# Patient Record
Sex: Female | Born: 1985 | Race: Black or African American | Hispanic: No | Marital: Single
Health system: Southern US, Community
[De-identification: ages and names within clinical notes are randomized; demographics above are authoritative.]

## PROBLEM LIST (undated history)

## (undated) ENCOUNTER — Inpatient Hospital Stay (HOSPITAL_COMMUNITY): Payer: Self-pay

## (undated) ENCOUNTER — Emergency Department (HOSPITAL_COMMUNITY): Admission: EM | Payer: Self-pay | Source: Home / Self Care

## (undated) DIAGNOSIS — M4050 Lordosis, unspecified, site unspecified: Secondary | ICD-10-CM

## (undated) DIAGNOSIS — R519 Headache, unspecified: Secondary | ICD-10-CM

## (undated) DIAGNOSIS — I10 Essential (primary) hypertension: Secondary | ICD-10-CM

## (undated) DIAGNOSIS — F1220 Cannabis dependence, uncomplicated: Secondary | ICD-10-CM

## (undated) DIAGNOSIS — R569 Unspecified convulsions: Secondary | ICD-10-CM

## (undated) DIAGNOSIS — F4310 Post-traumatic stress disorder, unspecified: Secondary | ICD-10-CM

## (undated) DIAGNOSIS — R51 Headache: Secondary | ICD-10-CM

## (undated) DIAGNOSIS — F1410 Cocaine abuse, uncomplicated: Secondary | ICD-10-CM

## (undated) DIAGNOSIS — F1110 Opioid abuse, uncomplicated: Secondary | ICD-10-CM

## (undated) DIAGNOSIS — F319 Bipolar disorder, unspecified: Secondary | ICD-10-CM

## (undated) DIAGNOSIS — Z915 Personal history of self-harm: Secondary | ICD-10-CM

## (undated) HISTORY — PX: EYE SURGERY: SHX253

## (undated) HISTORY — DX: Headache, unspecified: R51.9

## (undated) HISTORY — PX: WISDOM TOOTH EXTRACTION: SHX21

## (undated) HISTORY — DX: Headache: R51

---
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
## 2006-02-18 ENCOUNTER — Emergency Department (HOSPITAL_COMMUNITY): Admission: EM | Admit: 2006-02-18 | Discharge: 2006-02-18 | Payer: Self-pay | Admitting: Emergency Medicine

## 2006-04-02 ENCOUNTER — Emergency Department (HOSPITAL_COMMUNITY): Admission: EM | Admit: 2006-04-02 | Discharge: 2006-04-03 | Payer: Self-pay | Admitting: Emergency Medicine

## 2006-07-07 ENCOUNTER — Emergency Department (HOSPITAL_COMMUNITY): Admission: EM | Admit: 2006-07-07 | Discharge: 2006-07-07 | Payer: Self-pay | Admitting: Emergency Medicine

## 2006-07-13 ENCOUNTER — Emergency Department (HOSPITAL_COMMUNITY): Admission: EM | Admit: 2006-07-13 | Discharge: 2006-07-13 | Payer: Self-pay | Admitting: Emergency Medicine

## 2006-09-18 ENCOUNTER — Emergency Department (HOSPITAL_COMMUNITY): Admission: EM | Admit: 2006-09-18 | Discharge: 2006-09-18 | Payer: Self-pay | Admitting: Family Medicine

## 2006-09-22 ENCOUNTER — Inpatient Hospital Stay (HOSPITAL_COMMUNITY): Admission: AD | Admit: 2006-09-22 | Discharge: 2006-09-22 | Payer: Self-pay | Admitting: Obstetrics & Gynecology

## 2006-12-25 ENCOUNTER — Ambulatory Visit (HOSPITAL_COMMUNITY): Admission: RE | Admit: 2006-12-25 | Discharge: 2006-12-25 | Payer: Self-pay | Admitting: Obstetrics and Gynecology

## 2007-01-17 ENCOUNTER — Ambulatory Visit (HOSPITAL_COMMUNITY): Admission: RE | Admit: 2007-01-17 | Discharge: 2007-01-17 | Payer: Self-pay | Admitting: Family Medicine

## 2007-02-07 ENCOUNTER — Ambulatory Visit: Payer: Self-pay | Admitting: Obstetrics and Gynecology

## 2007-02-07 ENCOUNTER — Inpatient Hospital Stay (HOSPITAL_COMMUNITY): Admission: AD | Admit: 2007-02-07 | Discharge: 2007-02-10 | Payer: Self-pay | Admitting: Obstetrics & Gynecology

## 2007-02-15 ENCOUNTER — Ambulatory Visit: Payer: Self-pay | Admitting: Family Medicine

## 2007-02-22 ENCOUNTER — Ambulatory Visit: Payer: Self-pay | Admitting: Family Medicine

## 2007-03-01 ENCOUNTER — Ambulatory Visit (HOSPITAL_COMMUNITY): Admission: RE | Admit: 2007-03-01 | Discharge: 2007-03-01 | Payer: Self-pay | Admitting: Family Medicine

## 2007-03-08 ENCOUNTER — Ambulatory Visit (HOSPITAL_COMMUNITY): Admission: RE | Admit: 2007-03-08 | Discharge: 2007-03-08 | Payer: Self-pay | Admitting: Family Medicine

## 2007-03-15 ENCOUNTER — Ambulatory Visit (HOSPITAL_COMMUNITY): Admission: RE | Admit: 2007-03-15 | Discharge: 2007-03-15 | Payer: Self-pay | Admitting: Family Medicine

## 2007-03-15 ENCOUNTER — Ambulatory Visit: Payer: Self-pay | Admitting: Family Medicine

## 2007-03-23 ENCOUNTER — Ambulatory Visit (HOSPITAL_COMMUNITY): Admission: RE | Admit: 2007-03-23 | Discharge: 2007-03-23 | Payer: Self-pay | Admitting: Family Medicine

## 2007-04-05 ENCOUNTER — Ambulatory Visit: Payer: Self-pay | Admitting: Family Medicine

## 2007-04-26 ENCOUNTER — Ambulatory Visit: Payer: Self-pay | Admitting: *Deleted

## 2007-05-03 ENCOUNTER — Ambulatory Visit: Payer: Self-pay | Admitting: Obstetrics & Gynecology

## 2007-05-03 ENCOUNTER — Inpatient Hospital Stay (HOSPITAL_COMMUNITY): Admission: AD | Admit: 2007-05-03 | Discharge: 2007-05-05 | Payer: Self-pay | Admitting: Obstetrics & Gynecology

## 2008-01-02 ENCOUNTER — Emergency Department (HOSPITAL_COMMUNITY): Admission: EM | Admit: 2008-01-02 | Discharge: 2008-01-02 | Payer: Self-pay | Admitting: Emergency Medicine

## 2008-12-02 ENCOUNTER — Emergency Department (HOSPITAL_COMMUNITY): Admission: EM | Admit: 2008-12-02 | Discharge: 2008-12-02 | Payer: Self-pay | Admitting: Emergency Medicine

## 2010-03-28 ENCOUNTER — Emergency Department (HOSPITAL_COMMUNITY): Admission: EM | Admit: 2010-03-28 | Discharge: 2010-03-29 | Payer: Self-pay | Admitting: Emergency Medicine

## 2010-05-04 ENCOUNTER — Emergency Department (HOSPITAL_COMMUNITY): Admission: EM | Admit: 2010-05-04 | Discharge: 2010-05-04 | Payer: Self-pay | Admitting: Emergency Medicine

## 2010-06-21 ENCOUNTER — Emergency Department (HOSPITAL_COMMUNITY): Admission: EM | Admit: 2010-06-21 | Discharge: 2010-06-21 | Payer: Self-pay | Admitting: Family Medicine

## 2010-08-16 ENCOUNTER — Encounter: Payer: Self-pay | Admitting: *Deleted

## 2010-09-14 ENCOUNTER — Emergency Department (HOSPITAL_COMMUNITY)
Admission: EM | Admit: 2010-09-14 | Discharge: 2010-09-14 | Disposition: A | Discharge status: Home / Self Care | Payer: Self-pay | Attending: Emergency Medicine | Admitting: Emergency Medicine

## 2010-09-14 DIAGNOSIS — J45909 Unspecified asthma, uncomplicated: Secondary | ICD-10-CM | POA: Insufficient documentation

## 2010-09-14 DIAGNOSIS — A499 Bacterial infection, unspecified: Secondary | ICD-10-CM | POA: Insufficient documentation

## 2010-09-14 DIAGNOSIS — N760 Acute vaginitis: Secondary | ICD-10-CM | POA: Insufficient documentation

## 2010-09-14 DIAGNOSIS — B9689 Other specified bacterial agents as the cause of diseases classified elsewhere: Secondary | ICD-10-CM | POA: Insufficient documentation

## 2010-09-14 LAB — URINE MICROSCOPIC-ADD ON

## 2010-09-14 LAB — URINALYSIS, ROUTINE W REFLEX MICROSCOPIC
Bilirubin Urine: NEGATIVE
Hgb urine dipstick: NEGATIVE
Ketones, ur: NEGATIVE mg/dL
Nitrite: NEGATIVE
Protein, ur: NEGATIVE mg/dL
Specific Gravity, Urine: 1.025 (ref 1.005–1.030)
Urobilinogen, UA: 0.2 mg/dL (ref 0.0–1.0)

## 2010-09-14 LAB — PREGNANCY, URINE: Preg Test, Ur: NEGATIVE

## 2010-09-14 LAB — WET PREP, GENITAL: Trich, Wet Prep: NONE SEEN

## 2010-09-15 LAB — GC/CHLAMYDIA PROBE AMP, GENITAL
Chlamydia, DNA Probe: NEGATIVE
GC Probe Amp, Genital: NEGATIVE

## 2010-10-05 LAB — POCT URINALYSIS DIPSTICK
Bilirubin Urine: NEGATIVE
Glucose, UA: NEGATIVE mg/dL
Ketones, ur: NEGATIVE mg/dL
Protein, ur: NEGATIVE mg/dL
Specific Gravity, Urine: 1.025 (ref 1.005–1.030)

## 2010-10-05 LAB — WET PREP, GENITAL
Trich, Wet Prep: NONE SEEN
Yeast Wet Prep HPF POC: NONE SEEN

## 2010-10-05 LAB — GC/CHLAMYDIA PROBE AMP, GENITAL
Chlamydia, DNA Probe: NEGATIVE
GC Probe Amp, Genital: NEGATIVE

## 2010-10-06 LAB — URINALYSIS, ROUTINE W REFLEX MICROSCOPIC
Glucose, UA: NEGATIVE mg/dL
Leukocytes, UA: NEGATIVE
Protein, ur: NEGATIVE mg/dL
pH: 6 (ref 5.0–8.0)

## 2010-10-06 LAB — URINE MICROSCOPIC-ADD ON

## 2010-10-06 LAB — WET PREP, GENITAL

## 2010-10-06 LAB — GC/CHLAMYDIA PROBE AMP, GENITAL: GC Probe Amp, Genital: NEGATIVE

## 2010-11-02 LAB — URINALYSIS, ROUTINE W REFLEX MICROSCOPIC
Protein, ur: NEGATIVE mg/dL
Specific Gravity, Urine: 1.016 (ref 1.005–1.030)
Urobilinogen, UA: 0.2 mg/dL (ref 0.0–1.0)

## 2010-11-02 LAB — GC/CHLAMYDIA PROBE AMP, GENITAL
Chlamydia, DNA Probe: NEGATIVE
GC Probe Amp, Genital: NEGATIVE

## 2010-11-02 LAB — DIFFERENTIAL
Basophils Absolute: 0 10*3/uL (ref 0.0–0.1)
Basophils Relative: 0 % (ref 0–1)
Eosinophils Absolute: 0.5 10*3/uL (ref 0.0–0.7)
Neutrophils Relative %: 69 % (ref 43–77)

## 2010-11-02 LAB — CBC
MCHC: 33.6 g/dL (ref 30.0–36.0)
MCV: 89.9 fL (ref 78.0–100.0)
Platelets: 375 10*3/uL (ref 150–400)
RDW: 15.5 % (ref 11.5–15.5)

## 2010-11-02 LAB — WET PREP, GENITAL
Trich, Wet Prep: NONE SEEN
Yeast Wet Prep HPF POC: NONE SEEN

## 2010-11-02 LAB — POCT I-STAT, CHEM 8
Glucose, Bld: 83 mg/dL (ref 70–99)
HCT: 43 % (ref 36.0–46.0)
Hemoglobin: 14.6 g/dL (ref 12.0–15.0)
Potassium: 3.9 mEq/L (ref 3.5–5.1)
Sodium: 136 mEq/L (ref 135–145)

## 2010-11-11 ENCOUNTER — Emergency Department (HOSPITAL_COMMUNITY): Payer: Self-pay

## 2010-11-11 ENCOUNTER — Emergency Department (HOSPITAL_COMMUNITY)
Admission: EM | Admit: 2010-11-11 | Discharge: 2010-11-12 | Disposition: A | Discharge status: Home / Self Care | Payer: Self-pay | Attending: Emergency Medicine | Admitting: Emergency Medicine

## 2010-11-11 DIAGNOSIS — J029 Acute pharyngitis, unspecified: Secondary | ICD-10-CM | POA: Insufficient documentation

## 2010-11-11 DIAGNOSIS — R059 Cough, unspecified: Secondary | ICD-10-CM | POA: Insufficient documentation

## 2010-11-11 DIAGNOSIS — R05 Cough: Secondary | ICD-10-CM | POA: Insufficient documentation

## 2010-11-11 DIAGNOSIS — J40 Bronchitis, not specified as acute or chronic: Secondary | ICD-10-CM | POA: Insufficient documentation

## 2010-11-24 ENCOUNTER — Emergency Department (HOSPITAL_COMMUNITY)
Admission: EM | Admit: 2010-11-24 | Discharge: 2010-11-24 | Disposition: A | Discharge status: Home / Self Care | Payer: Self-pay | Attending: Emergency Medicine | Admitting: Emergency Medicine

## 2010-11-24 DIAGNOSIS — N898 Other specified noninflammatory disorders of vagina: Secondary | ICD-10-CM | POA: Insufficient documentation

## 2010-11-24 DIAGNOSIS — J45909 Unspecified asthma, uncomplicated: Secondary | ICD-10-CM | POA: Insufficient documentation

## 2010-11-24 DIAGNOSIS — A499 Bacterial infection, unspecified: Secondary | ICD-10-CM | POA: Insufficient documentation

## 2010-11-24 DIAGNOSIS — F17200 Nicotine dependence, unspecified, uncomplicated: Secondary | ICD-10-CM | POA: Insufficient documentation

## 2010-11-24 DIAGNOSIS — N760 Acute vaginitis: Secondary | ICD-10-CM | POA: Insufficient documentation

## 2010-11-24 DIAGNOSIS — B9689 Other specified bacterial agents as the cause of diseases classified elsewhere: Secondary | ICD-10-CM | POA: Insufficient documentation

## 2010-11-24 LAB — URINALYSIS, ROUTINE W REFLEX MICROSCOPIC
Bilirubin Urine: NEGATIVE
Glucose, UA: NEGATIVE mg/dL
Ketones, ur: NEGATIVE mg/dL
Leukocytes, UA: NEGATIVE
Specific Gravity, Urine: 1.027 (ref 1.005–1.030)
pH: 6 (ref 5.0–8.0)

## 2010-11-24 LAB — URINE MICROSCOPIC-ADD ON

## 2010-11-24 LAB — WET PREP, GENITAL: Trich, Wet Prep: NONE SEEN

## 2010-11-25 LAB — GC/CHLAMYDIA PROBE AMP, GENITAL: Chlamydia, DNA Probe: NEGATIVE

## 2010-11-26 LAB — POCT I-STAT, CHEM 8
BUN: 17 mg/dL (ref 6–23)
Calcium, Ion: 1.14 mmol/L (ref 1.12–1.32)
Chloride: 103 mEq/L (ref 96–112)
Glucose, Bld: 67 mg/dL — ABNORMAL LOW (ref 70–99)
TCO2: 27 mmol/L (ref 0–100)

## 2010-12-07 NOTE — Discharge Summary (Signed)
NAMEALVENIA, Jessica Walton               ACCOUNT NO.:  [id number]   MEDICAL RECORD NO.:  [id number]          PATIENT TYPE:  INP   LOCATION:  9127                          FACILITY:  [facility]   PHYSICIAN:  Ginger Carne, MD  DATE OF BIRTH:  Jan 01, 1986   DATE OF ADMISSION:  02/07/2007  DATE OF DISCHARGE:  02/10/2007                               DISCHARGE SUMMARY   This is a 25 year old African-American female who presented with  cervical shortening on MFM evaluation of 1.4 cm.  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  She had been started on progesterone  suppositories with an initial diagnosis of a cervix of 1.9 cm but failed  to take said prescribed medication due to vaginal irritation.  On NMFM  she was noted to have a cervical length of 1.4 cm.  This has remained  stable.  On the morning of discharge the total dimension is 1.4 cm with  a functional length of 142 cm.  There is a 15% funneling noted.  The  patient denies bleeding, discharge, or vaginal pressure.  She had  received a course of betamethasone in the hospital and also Dilutum 250  mg IM.  She also received Indocin 100 mg low dose, then 50 mg q.12 hours as  needed.  The patient was discharged with prenatal vitamins and asked to  continue at the [facility] Thursday.  Her next appointment  is February 15, 2007 at which time she will receive Dilutum intramuscularly.  Obstetrical followup, obstetrical ultrasound for cervical length.  The  patient was advised to restrict strenuous activity and to avoid  intercourse.  She was also advised to contact the office go to the [facility]  for vaginal bleeding, increased vaginal discharge or increasing vaginal  pressure.  All questions answered to the satisfaction of patient and her  mother, and patient verbalized understanding of same.      Ginger Carne, MD  Electronically Signed     SHB/MEDQ  D:   02/10/2007  T:  02/10/2007  Job:  [id number]   cc:   [facility]

## 2011-03-02 ENCOUNTER — Emergency Department (HOSPITAL_COMMUNITY)
Admission: EM | Admit: 2011-03-02 | Discharge: 2011-03-02 | Disposition: A | Discharge status: Home / Self Care | Payer: Self-pay | Attending: Emergency Medicine | Admitting: Emergency Medicine

## 2011-03-02 DIAGNOSIS — J45909 Unspecified asthma, uncomplicated: Secondary | ICD-10-CM | POA: Insufficient documentation

## 2011-03-02 DIAGNOSIS — N760 Acute vaginitis: Secondary | ICD-10-CM | POA: Insufficient documentation

## 2011-03-02 LAB — WET PREP, GENITAL: Trich, Wet Prep: NONE SEEN

## 2011-03-02 LAB — URINALYSIS, ROUTINE W REFLEX MICROSCOPIC
Bilirubin Urine: NEGATIVE
Glucose, UA: NEGATIVE mg/dL
Hgb urine dipstick: NEGATIVE
Ketones, ur: NEGATIVE mg/dL
Specific Gravity, Urine: 1.02 (ref 1.005–1.030)
pH: 7.5 (ref 5.0–8.0)

## 2011-03-02 LAB — POCT PREGNANCY, URINE: Preg Test, Ur: NEGATIVE

## 2011-03-03 LAB — GC/CHLAMYDIA PROBE AMP, GENITAL
Chlamydia, DNA Probe: NEGATIVE
GC Probe Amp, Genital: NEGATIVE

## 2011-04-21 LAB — URINALYSIS, ROUTINE W REFLEX MICROSCOPIC
Bilirubin Urine: NEGATIVE
Hgb urine dipstick: NEGATIVE
Ketones, ur: >80 — AB
Nitrite: NEGATIVE
Specific Gravity, Urine: 1.023
Urobilinogen, UA: 0.2

## 2011-04-21 LAB — POCT I-STAT, CHEM 8
BUN: 10
Calcium, Ion: 1.07 — ABNORMAL LOW
Chloride: 102
Creatinine, Ser: 1.2
TCO2: 25

## 2011-04-21 LAB — URINE MICROSCOPIC-ADD ON

## 2011-04-21 LAB — PREGNANCY, URINE: Preg Test, Ur: NEGATIVE

## 2011-05-05 LAB — CBC
HCT: 34.5 — ABNORMAL LOW
MCHC: 33.9
MCV: 87.8
RBC: 3.93
WBC: 8.8

## 2011-05-05 LAB — POCT URINALYSIS DIP (DEVICE)
Glucose, UA: NEGATIVE
Hgb urine dipstick: NEGATIVE
Ketones, ur: NEGATIVE
Operator id: 134861
Protein, ur: NEGATIVE
Specific Gravity, Urine: 1.015
Urobilinogen, UA: 0.2

## 2011-05-06 LAB — POCT URINALYSIS DIP (DEVICE)
Bilirubin Urine: NEGATIVE
Glucose, UA: NEGATIVE
Hgb urine dipstick: NEGATIVE
Specific Gravity, Urine: >=1.03
pH: 6

## 2011-05-09 LAB — URINALYSIS, ROUTINE W REFLEX MICROSCOPIC
Bilirubin Urine: NEGATIVE
Glucose, UA: NEGATIVE
Hgb urine dipstick: NEGATIVE
Specific Gravity, Urine: 1.02
Urobilinogen, UA: 0.2
pH: 6.5

## 2011-05-09 LAB — POCT URINALYSIS DIP (DEVICE)
Bilirubin Urine: NEGATIVE
Bilirubin Urine: NEGATIVE
Glucose, UA: NEGATIVE
Glucose, UA: NEGATIVE
Hgb urine dipstick: NEGATIVE
Ketones, ur: NEGATIVE
Ketones, ur: NEGATIVE
Nitrite: NEGATIVE
pH: 6
pH: 7

## 2011-05-09 LAB — CBC
HCT: 35.3 — ABNORMAL LOW
MCV: 91.1

## 2011-05-09 LAB — WET PREP, GENITAL
Clue Cells Wet Prep HPF POC: NONE SEEN
Trich, Wet Prep: NONE SEEN
Yeast Wet Prep HPF POC: NONE SEEN

## 2011-05-09 LAB — GC/CHLAMYDIA PROBE AMP, GENITAL
Chlamydia, DNA Probe: NEGATIVE
GC Probe Amp, Genital: NEGATIVE

## 2011-05-09 LAB — FETAL FIBRONECTIN: Fetal Fibronectin: NEGATIVE

## 2011-06-22 ENCOUNTER — Emergency Department (HOSPITAL_COMMUNITY)
Admission: EM | Admit: 2011-06-22 | Discharge: 2011-06-23 | Disposition: A | Discharge status: Home / Self Care | Payer: Self-pay | Attending: Emergency Medicine | Admitting: Emergency Medicine

## 2011-06-22 DIAGNOSIS — J398 Other specified diseases of upper respiratory tract: Secondary | ICD-10-CM | POA: Insufficient documentation

## 2011-06-22 DIAGNOSIS — N760 Acute vaginitis: Secondary | ICD-10-CM | POA: Insufficient documentation

## 2011-06-22 DIAGNOSIS — R059 Cough, unspecified: Secondary | ICD-10-CM | POA: Insufficient documentation

## 2011-06-22 DIAGNOSIS — J399 Disease of upper respiratory tract, unspecified: Secondary | ICD-10-CM

## 2011-06-22 DIAGNOSIS — A499 Bacterial infection, unspecified: Secondary | ICD-10-CM | POA: Insufficient documentation

## 2011-06-22 DIAGNOSIS — B9689 Other specified bacterial agents as the cause of diseases classified elsewhere: Secondary | ICD-10-CM | POA: Insufficient documentation

## 2011-06-22 DIAGNOSIS — R05 Cough: Secondary | ICD-10-CM | POA: Insufficient documentation

## 2011-06-22 LAB — URINALYSIS, ROUTINE W REFLEX MICROSCOPIC
Bilirubin Urine: NEGATIVE
Nitrite: NEGATIVE
Specific Gravity, Urine: 1.024 (ref 1.005–1.030)
Urobilinogen, UA: 0.2 mg/dL (ref 0.0–1.0)
pH: 6 (ref 5.0–8.0)

## 2011-06-22 LAB — POCT PREGNANCY, URINE: Preg Test, Ur: NEGATIVE

## 2011-06-22 LAB — URINE MICROSCOPIC-ADD ON

## 2011-06-23 LAB — WET PREP, GENITAL: Yeast Wet Prep HPF POC: NONE SEEN

## 2011-06-23 LAB — GC/CHLAMYDIA PROBE AMP, GENITAL: Chlamydia, DNA Probe: NEGATIVE

## 2011-06-23 MED ORDER — METRONIDAZOLE 0.75 % VA GEL: 1.0000 | Freq: Every day | VAGINAL | Status: AC

## 2011-06-23 MED ORDER — METRONIDAZOLE 0.75 % VA GEL: 1.0000 | Freq: Two times a day (BID) | VAGINAL | Status: DC

## 2011-06-23 NOTE — ED Provider Notes (Signed)
History     CSN: [id number] Arrival date & time: 06/22/2011  8:58 PM   First MD Initiated Contact with Patient 06/22/11 2311      Chief Complaint  Patient presents with  . Cough    w/chest congestion.  pain to chest w/cough  . Vaginal Discharge    w/foul odor x 1 week  . Vaginal Itching    (Consider location/radiation/quality/duration/timing/severity/associated sxs/prior treatment) HPI 25 year-old the female presents to emergency room with 2 complaints. First complaint is one week of cough. She reports initially she had nasal congestion runny nose postnasal drip, she reports that the cold is now settled into her chest. She has not had any fevers. She reports the cough is sometimes productive of yellow phlegm. She has not taken any medicine prior to this visit. She is a smoker. She has not had a flu shot. No known sick contacts.  Patient's second complaint is of vaginal discharge for last week. Patient reports that she uses condoms for contraception, reports that the condom broke about 2 weeks ago. Patient has had similar complaints of vaginal itching and discharge in the past. Patient is not tried any over-the-counter medications prior to arrival. Patient does not have a gynecologist, does not see the health department. Patient denies any abdominal pain urinary symptoms. No vaginal bleeding. Last missed her period was November 1, due for her next period any day now. Past Medical History  Diagnosis Date  . Asthma     Past Surgical History  Procedure Date  . Eye surgery     History reviewed. No pertinent family history.  History  Substance Use Topics  . Smoking status: Current Some Day Smoker  . Smokeless tobacco: Not on file  . Alcohol Use: 1.2 oz/week    2 Shots of liquor per week     every other week    OB History    Grav Para Term Preterm Abortions TAB SAB Ect Mult Living                  Review of Systems  All other systems reviewed and are  negative.    Allergies  Review of patient's allergies indicates no known allergies.  Home Medications  No current outpatient prescriptions on file.  BP 136/92  Pulse 74  Temp(Src) 98.2 F (36.8 C) (Oral)  Resp 18  Ht 5\' 9"  (1.753 m)  Wt 170 lb (77.111 kg)  BMI 25.10 kg/m2  SpO2 100%  LMP 05/26/2011  Physical Exam  Nursing note and vitals reviewed. Constitutional: She appears well-developed and well-nourished.  HENT:  Head: Normocephalic and atraumatic.  Nose: Nose normal.  Mouth/Throat: Oropharynx is clear and moist.  Eyes: Conjunctivae and EOM are normal. Pupils are equal, round, and reactive to light.  Neck: Normal range of motion. Neck supple. No JVD present. No tracheal deviation present. No thyromegaly present.  Cardiovascular: Normal rate, regular rhythm, normal heart sounds and intact distal pulses.  Exam reveals no gallop and no friction rub.   No murmur heard. Pulmonary/Chest: Effort normal and breath sounds normal. No stridor. No respiratory distress. She has no wheezes. She has no rales. She exhibits no tenderness.       Patient with occasional dry cough noted  Abdominal: Soft. Bowel sounds are normal. She exhibits no distension and no mass. There is no tenderness. There is no rebound and no guarding.  Genitourinary: Vagina normal.       Foul odor on pelvic exam, thick white discharge noted petechiae  to cervix, cervix is friable. No cervical motion tenderness no adnexal tenderness no uterine tenderness no bladder tenderness  Musculoskeletal: Normal range of motion. She exhibits no edema and no tenderness.  Lymphadenopathy:    She has no cervical adenopathy.  Skin: Skin is dry. No rash noted. No erythema. No pallor.  Psychiatric: She has a normal mood and affect. Her behavior is normal. Judgment and thought content normal.    ED Course  Procedures (including critical care time)  Labs Reviewed  URINALYSIS, ROUTINE W REFLEX MICROSCOPIC - Abnormal; Notable for  the following:    APPearance CLOUDY (*)    Leukocytes, UA TRACE (*)    All other components within normal limits  URINE MICROSCOPIC-ADD ON - Abnormal; Notable for the following:    Squamous Epithelial / LPF MANY (*)    All other components within normal limits  POCT PREGNANCY, URINE  POCT PREGNANCY, URINE  GC/CHLAMYDIA PROBE AMP, GENITAL  WET PREP, GENITAL   No results found.   No diagnosis found.    MDM  25 year old female with upper respiratory illness mild in nature and vaginal discharge most likely bacterial vaginosis await wet prep will treat accordingly        Olivia Mackie, MD 06/23/11 [phone]

## 2011-08-16 ENCOUNTER — Emergency Department (HOSPITAL_COMMUNITY)
Admission: EM | Admit: 2011-08-16 | Discharge: 2011-08-17 | Disposition: A | Discharge status: Home / Self Care | Payer: Self-pay | Attending: Emergency Medicine | Admitting: Emergency Medicine

## 2011-08-16 ENCOUNTER — Encounter (HOSPITAL_COMMUNITY): Payer: Self-pay | Admitting: *Deleted

## 2011-08-16 DIAGNOSIS — H9209 Otalgia, unspecified ear: Secondary | ICD-10-CM | POA: Insufficient documentation

## 2011-08-16 DIAGNOSIS — J45909 Unspecified asthma, uncomplicated: Secondary | ICD-10-CM | POA: Insufficient documentation

## 2011-08-16 DIAGNOSIS — R509 Fever, unspecified: Secondary | ICD-10-CM | POA: Insufficient documentation

## 2011-08-16 DIAGNOSIS — J029 Acute pharyngitis, unspecified: Secondary | ICD-10-CM | POA: Insufficient documentation

## 2011-08-16 MED ORDER — PENICILLIN G BENZATHINE 1200000 UNIT/2ML IM SUSP
1.2000 10*6.[IU] | Freq: Once | INTRAMUSCULAR | Status: AC
  Administered 2011-08-16: 1.2 10*6.[IU] via INTRAMUSCULAR
  Filled 2011-08-16: qty 2

## 2011-08-16 MED ORDER — HYDROCODONE-ACETAMINOPHEN 5-325 MG PO TABS
1.0000 | ORAL_TABLET | Freq: Once | ORAL | Status: AC
  Administered 2011-08-16: 1 via ORAL
  Filled 2011-08-16: qty 1

## 2011-08-16 NOTE — ED Provider Notes (Signed)
History     CSN: [id number]  Arrival date & time 08/16/11  2159   None     Chief Complaint  Patient presents with  . Sore Throat    (Consider location/radiation/quality/duration/timing/severity/associated sxs/prior treatment) HPI  Pt presents to the ED with complaints of sore throat and ear pain. The patient has had a low  Grade fever, denies chills, body aches, N/V/D, abdominal pain, weakness. The symptoms have been lasting for 1 week and she states that it hurts to swallow. Pt is moving air well, not tripoding, handeling her secretions well.   Past Medical History  Diagnosis Date  . Asthma     Past Surgical History  Procedure Date  . Eye surgery     History reviewed. No pertinent family history.  History  Substance Use Topics  . Smoking status: Current Some Day Smoker  . Smokeless tobacco: Not on file  . Alcohol Use: 1.2 oz/week    2 Shots of liquor per week     every other week    OB History    Grav Para Term Preterm Abortions TAB SAB Ect Mult Living                  Review of Systems  All other systems reviewed and are negative.    Allergies  Review of patient's allergies indicates no known allergies.  Home Medications  No current outpatient prescriptions on file.  BP 147/82  Pulse 102  Temp(Src) 99.4 F (37.4 C) (Oral)  Resp 20  SpO2 100%  Physical Exam  Constitutional: She appears well-developed and well-nourished.  HENT:  Head: Normocephalic and atraumatic.  Mouth/Throat: Uvula is midline and mucous membranes are normal. Normal dentition. No dental caries. Oropharyngeal exudate and posterior oropharyngeal erythema present. No posterior oropharyngeal edema or tonsillar abscesses.  Eyes: Conjunctivae are normal. Pupils are equal, round, and reactive to light.  Neck: Trachea normal, normal range of motion and full passive range of motion without pain. Neck supple.  Cardiovascular: Normal rate, regular rhythm and normal pulses.     Pulmonary/Chest: Effort normal and breath sounds normal. Chest wall is not dull to percussion. She exhibits no tenderness, no crepitus, no edema, no deformity and no retraction.  Abdominal: Soft. Normal appearance.  Musculoskeletal: Normal range of motion.  Neurological: She is alert. She has normal strength.  Skin: Skin is warm, dry and intact.  Psychiatric: Her speech is normal. Cognition and memory are normal.    ED Course  Procedures (including critical care time)  Labs Reviewed - No data to display No results found.   1. Pharyngitis       MDM  pts mother requesting patient to have a shot instead of  A prescription to go home because she says that they do not have $4 to get abx. I have pt PCN shot and a Lortab in ED. No Rx for home. Pt will return to ED as needed.        Dorthula Matas, PA 08/16/11 2312  Dorthula Matas, PA 08/16/11 2312  Medical screening examination/treatment/procedure(s) were performed by non-physician practitioner and as supervising physician I was immediately available for consultation/collaboration.  Sunnie Nielsen, MD 08/17/11 [phone]

## 2011-08-16 NOTE — ED Notes (Signed)
Pt in c/o sore throat and right earache x1 week

## 2011-08-26 ENCOUNTER — Encounter (HOSPITAL_COMMUNITY): Payer: Self-pay | Admitting: Emergency Medicine

## 2011-08-26 ENCOUNTER — Emergency Department (HOSPITAL_COMMUNITY)
Admission: EM | Admit: 2011-08-26 | Discharge: 2011-08-26 | Disposition: A | Discharge status: Home / Self Care | Payer: Self-pay | Attending: Emergency Medicine | Admitting: Emergency Medicine

## 2011-08-26 DIAGNOSIS — L509 Urticaria, unspecified: Secondary | ICD-10-CM | POA: Insufficient documentation

## 2011-08-26 DIAGNOSIS — W57XXXA Bitten or stung by nonvenomous insect and other nonvenomous arthropods, initial encounter: Secondary | ICD-10-CM | POA: Insufficient documentation

## 2011-08-26 DIAGNOSIS — T148 Other injury of unspecified body region: Secondary | ICD-10-CM | POA: Insufficient documentation

## 2011-08-26 MED ORDER — HYDROCORTISONE 1 % EX CREA: TOPICAL_CREAM | CUTANEOUS | Status: AC

## 2011-08-26 MED ORDER — DIPHENHYDRAMINE HCL 25 MG PO CAPS
50.0000 mg | ORAL_CAPSULE | Freq: Once | ORAL | Status: AC
  Administered 2011-08-26: 50 mg via ORAL
  Filled 2011-08-26: qty 2

## 2011-08-26 NOTE — ED Notes (Signed)
Pt believes she was bite by something today. Pt has red area on left wrist with a small bite area noted. Pt also has 2 raised, red area on upper left arm. Pt in no acute distress.

## 2011-08-26 NOTE — ED Provider Notes (Signed)
History     CSN: [id number]  Arrival date & time 08/26/11  1743   First MD Initiated Contact with Patient 08/26/11 1833      No chief complaint on file.   (Consider location/radiation/quality/duration/timing/severity/associated sxs/prior treatment) HPI Comments: The patient awoke this morning with 3 itchy areas on left upper extremity. One on left wrist and 2 on the left upper arm. Patient states she is staying in a new house. No one else has the same rash. No fevers, nausea/vomiting. No treatments prior.  Patient is a 26 y.o. female presenting with rash. The history is provided by the patient.  Rash  This is a new problem. The current episode started 12 to 24 hours ago. The problem has not changed since onset.The problem is associated with an unknown factor. There has been no fever. She has tried nothing for the symptoms.    Past Medical History  Diagnosis Date  . Asthma     Past Surgical History  Procedure Date  . Eye surgery     No family history on file.  History  Substance Use Topics  . Smoking status: Current Some Day Smoker  . Smokeless tobacco: Current User  . Alcohol Use: 1.2 oz/week    2 Shots of liquor per week     every other week    OB History    Grav Para Term Preterm Abortions TAB SAB Ect Mult Living                  Review of Systems  Constitutional: Negative for fever.  Musculoskeletal: Negative for myalgias.  Skin: Positive for rash.  Hematological: Negative for adenopathy.    Allergies  Review of patient's allergies indicates no known allergies.  Home Medications   Current Outpatient Rx  Name Route Sig Dispense Refill  . VH ESSENTIALS BV TREATMENT PO CAPS Oral Take 1 capsule by mouth daily.      BP 139/83  Pulse 90  Temp(Src) 98.3 F (36.8 C) (Oral)  Resp 20  SpO2 99%  LMP 08/19/2011  Physical Exam  Nursing note and vitals reviewed. Constitutional: She is oriented to person, place, and time. She appears well-developed and  well-nourished.  HENT:  Head: Normocephalic and atraumatic.  Eyes: Conjunctivae are normal.  Neck: Normal range of motion. Neck supple.  Cardiovascular:  Pulses:      Radial pulses are 2+ on the right side, and 2+ on the left side.  Pulmonary/Chest: No respiratory distress.  Musculoskeletal: Normal range of motion.  Neurological: She is alert and oriented to person, place, and time.  Skin: Skin is warm and dry. Rash noted. Rash is urticarial.          Two raised, itchy, circular, raised erythematous lesions, approximately 4cm in diameter, upper left extremity. Consistent with insect bite and local allergic rxn. There is a small papular area on wrist as well. No cellulitis.   Psychiatric: She has a normal mood and affect.    ED Course  Procedures (including critical care time)  Labs Reviewed - No data to display No results found.   1. Bed bug bite     7:06 PM Patient seen and examined.  Vital signs reviewed and are as follows: Filed Vitals:   08/26/11 1800  BP: 139/83  Pulse: 90  Temp: 98.3 F (36.8 C)  Resp: 20   Patient counseled to take measures to eradicate possible infestation. Counseled to use OTC antihistamines for itching. Rx for steroid cream.   Pt counseled  on s/s to return.   Questions answered. Pt verbalizes understanding and agrees with plan.    MDM  Rash, c/w allergic reaction to insect bite. Bed bugs likely 2/2 living in new house and appearance of lesions.         Eustace Moore [place], [place] 08/26/11 2326

## 2011-08-27 NOTE — ED Provider Notes (Signed)
Medical screening examination/treatment/procedure(s) were performed by non-physician practitioner and as supervising physician I was immediately available for consultation/collaboration. Hatice Bubel Y.   Rozlynn Lippold Y. Maxim Bedel, MD 08/27/11 2257 

## 2011-12-15 ENCOUNTER — Encounter (HOSPITAL_COMMUNITY): Payer: Self-pay | Admitting: Emergency Medicine

## 2011-12-15 ENCOUNTER — Emergency Department (INDEPENDENT_AMBULATORY_CARE_PROVIDER_SITE_OTHER)
Admission: EM | Admit: 2011-12-15 | Discharge: 2011-12-15 | Disposition: A | Discharge status: Home / Self Care | Payer: Self-pay | Source: Home / Self Care | Attending: Emergency Medicine | Admitting: Emergency Medicine

## 2011-12-15 DIAGNOSIS — A499 Bacterial infection, unspecified: Secondary | ICD-10-CM

## 2011-12-15 DIAGNOSIS — N760 Acute vaginitis: Secondary | ICD-10-CM

## 2011-12-15 DIAGNOSIS — B354 Tinea corporis: Secondary | ICD-10-CM

## 2011-12-15 LAB — WET PREP, GENITAL: Trich, Wet Prep: NONE SEEN

## 2011-12-15 LAB — POCT URINALYSIS DIP (DEVICE)
Protein, ur: NEGATIVE mg/dL
Specific Gravity, Urine: 1.02 (ref 1.005–1.030)
Urobilinogen, UA: 0.2 mg/dL (ref 0.0–1.0)

## 2011-12-15 LAB — POCT PREGNANCY, URINE: Preg Test, Ur: NEGATIVE

## 2011-12-15 MED ORDER — METRONIDAZOLE 0.75 % VA GEL: 1.0000 | VAGINAL | Status: AC

## 2011-12-15 MED ORDER — METRONIDAZOLE 500 MG PO TABS: 500.0000 mg | ORAL_TABLET | Freq: Two times a day (BID) | ORAL | Status: AC

## 2011-12-15 MED ORDER — CLOTRIMAZOLE 1 % EX CREA: TOPICAL_CREAM | Freq: Two times a day (BID) | CUTANEOUS | Status: DC

## 2011-12-15 NOTE — Discharge Instructions (Signed)
Try to use probiotics.  You can get them at [employer] or [employer] grocery stores; you may be able to get them other places too.  The staff at those stores can help you choose one.  Whatever the dose is for regular daily use, take that 3 times a day for a month, then you can switch to the regular daily dose once a day.    Call the health department for an appointment for a pap test.   Use the ringworm cream until the rash is gone, then keep using it for another week after that.    Bacterial Vaginosis Bacterial vaginosis (BV) is a vaginal infection where the normal balance of bacteria in the vagina is disrupted. The normal balance is then replaced by an overgrowth of certain bacteria. There are several different kinds of bacteria that can cause BV. BV is the most common vaginal infection in women of childbearing age. CAUSES   The cause of BV is not fully understood. BV develops when there is an increase or imbalance of harmful bacteria.   Some activities or behaviors can upset the normal balance of bacteria in the vagina and put women at increased risk including:   Having a new sex partner or multiple sex partners.   Douching.   Using an intrauterine device (IUD) for contraception.   It is not clear what role sexual activity plays in the development of BV. However, women that have never had sexual intercourse are rarely infected with BV.  Women do not get BV from toilet seats, bedding, swimming pools or from touching objects around them.  SYMPTOMS   Grey vaginal discharge.   A fish-like odor with discharge, especially after sexual intercourse.   Itching or burning of the vagina and vulva.   Burning or pain with urination.   Some women have no signs or symptoms at all.  DIAGNOSIS  Your caregiver must examine the vagina for signs of BV. Your caregiver will perform lab tests and look at the sample of vaginal fluid through a microscope. They will look for bacteria and abnormal cells  (clue cells), a pH test higher than 4.5, and a positive amine test all associated with BV.  RISKS AND COMPLICATIONS   Pelvic inflammatory disease (PID).   Infections following gynecology surgery.   Developing HIV.   Developing herpes virus.  TREATMENT  Sometimes BV will clear up without treatment. However, all women with symptoms of BV should be treated to avoid complications, especially if gynecology surgery is planned. Female partners generally do not need to be treated. However, BV may spread between female sex partners so treatment is helpful in preventing a recurrence of BV.   BV may be treated with antibiotics. The antibiotics come in either pill or vaginal cream forms. Either can be used with nonpregnant or pregnant women, but the recommended dosages differ. These antibiotics are not harmful to the baby.   BV can recur after treatment. If this happens, a second round of antibiotics will often be prescribed.   Treatment is important for pregnant women. If not treated, BV can cause a premature delivery, especially for a pregnant woman who had a premature birth in the past. All pregnant women who have symptoms of BV should be checked and treated.   For chronic reoccurrence of BV, treatment with a type of prescribed gel vaginally twice a week is helpful.  HOME CARE INSTRUCTIONS   Finish all medication as directed by your caregiver.   Do not have  sex until treatment is completed.   Tell your sexual partner that you have a vaginal infection. They should see their caregiver and be treated if they have problems, such as a mild rash or itching.   Practice safe sex. Use condoms. Only have 1 sex partner.  PREVENTION  Basic prevention steps can help reduce the risk of upsetting the natural balance of bacteria in the vagina and developing BV:  Do not have sexual intercourse (be abstinent).   Do not douche.   Use all of the medicine prescribed for treatment of BV, even if the signs and  symptoms go away.   Tell your sex partner if you have BV. That way, they can be treated, if needed, to prevent reoccurrence.  SEEK MEDICAL CARE IF:   Your symptoms are not improving after 3 days of treatment.   You have increased discharge, pain, or fever.  MAKE SURE YOU:   Understand these instructions.   Will watch your condition.   Will get help right away if you are not doing well or get worse.  FOR MORE INFORMATION  Division of STD Prevention (DSTDP), Centers for Disease Control and Prevention: [employer] American Social Health Association (ASHA): www.ashastd.org  Document Released: 07/11/2005 Document Revised: 06/30/2011 Document Reviewed: 01/01/2009 [facility] Patient Information 2012 [place], [place]. Ringworm, Body [Tinea Corporis] Ringworm is a fungal infection of the skin and hair. Another name for this problem is Tinea Corporis. It has nothing to do with worms. A fungus is an organism that lives on dead cells (the outer layer of skin). It can involve the entire body. It can spread from infected pets. Tinea corporis can be a problem in wrestlers who may get the infection form other players/opponents, equipment and mats. DIAGNOSIS  A skin scraping can be obtained from the affected area and by looking for fungus under the microscope. This is called a KOH examination.  HOME CARE INSTRUCTIONS   Ringworm may be treated with a topical antifungal cream, ointment, or oral medications.   If you are using a cream or ointment, wash infected skin. Dry it completely before application.   Scrub the skin with a buff puff or abrasive sponge using a shampoo with ketoconazole to remove dead skin and help treat the ringworm.   Have your pet treated by your veterinarian if it has the same infection.  SEEK MEDICAL CARE IF:   Your ringworm patch (fungus) continues to spread after 7 days of treatment.   Your rash is not gone in 4 weeks. Fungal infections are slow to respond to treatment.  Some redness (erythema) may remain for several weeks after the fungus is gone.   The area becomes red, warm, tender, and swollen beyond the patch. This may be a secondary bacterial (germ) infection.   You have a fever.  Document Released: 07/08/2000 Document Revised: 06/30/2011 Document Reviewed: 12/19/2008 [facility] Patient Information 2012 [place], [place].

## 2011-12-15 NOTE — ED Provider Notes (Signed)
History     CSN: [id number]  Arrival date & time 12/15/11  0904   First MD Initiated Contact with Patient 12/15/11 1041      Chief Complaint  Patient presents with  . Vaginal Discharge  . Tinea    (Consider location/radiation/quality/duration/timing/severity/associated sxs/prior treatment) HPI Comments: Pt with recurrent bacterial vaginosis infections.   Patient is a 26 y.o. female presenting with vaginal discharge and rash. The history is provided by the patient.  Vaginal Discharge This is a new problem. Episode onset: 2 weeks ago. The problem occurs constantly. The problem has not changed since onset.Pertinent negatives include no abdominal pain. The symptoms are aggravated by nothing. The symptoms are relieved by nothing. The treatment provided no relief.  Rash  This is a new problem. The current episode started more than 2 days ago. The problem has not changed since onset.The problem is associated with nothing. There has been no fever. The rash is present on the back. The pain is at a severity of 0/10. Associated symptoms include itching. She has tried nothing for the symptoms.    Past Medical History  Diagnosis Date  . Asthma     Past Surgical History  Procedure Date  . Eye surgery     History reviewed. No pertinent family history.  History  Substance Use Topics  . Smoking status: Current Some Day Smoker  . Smokeless tobacco: Current User  . Alcohol Use: 1.2 oz/week    2 Shots of liquor per week     every other week    OB History    Grav Para Term Preterm Abortions TAB SAB Ect Mult Living                  Review of Systems  Constitutional: Negative for fever and chills.  Gastrointestinal: Negative for nausea, vomiting and abdominal pain.  Genitourinary: Positive for vaginal bleeding, vaginal discharge and genital sores. Negative for dysuria and pelvic pain.       Slight vaginal spotting lately  Skin: Positive for itching and rash.    Allergies  Review  of patient's allergies indicates no known allergies.  Home Medications   Current Outpatient Rx  Name Route Sig Dispense Refill  . CLOTRIMAZOLE 1 % EX CREA Topical Apply topically 2 (two) times daily. Apply until rash is completely gone, then apply for one more week after that. 30 g 1  . VH ESSENTIALS BV TREATMENT PO CAPS Oral Take 1 capsule by mouth daily.    Marland Kitchen HYDROCORTISONE 1 % EX CREA  Apply to affected area 2 times daily 15 g 0  . METRONIDAZOLE 500 MG PO TABS Oral Take 1 tablet (500 mg total) by mouth 2 (two) times daily. 14 tablet 0  . METRONIDAZOLE 0.75 % VA GEL Vaginal Place 1 Applicatorful vaginally 2 (two) times a week. For 6 months 70 g 3    BP 122/78  Pulse 85  Temp(Src) 98.5 F (36.9 C) (Oral)  Resp 16  SpO2 99%  LMP 12/09/2011  Physical Exam  Constitutional: She appears well-developed and well-nourished. No distress.  Cardiovascular: Normal rate and regular rhythm.   Pulmonary/Chest: Effort normal and breath sounds normal.  Abdominal: Bowel sounds are normal. There is no tenderness. There is no rebound and no guarding.  Genitourinary: Uterus normal. There is no rash, tenderness or lesion on the right labia. There is no rash, tenderness or lesion on the left labia. Cervix exhibits friability. Cervix exhibits no motion tenderness and no discharge. Right adnexum displays  no mass, no tenderness and no fullness. Left adnexum displays tenderness. Left adnexum displays no mass and no fullness. No tenderness or bleeding around the vagina. Vaginal discharge found.  Skin: Rash noted. Rash is maculopapular.       Circular rash with central clearing, crusting/scabbing at margins.  C/w tinea.     ED Course  Procedures (including critical care time)  Labs Reviewed  POCT URINALYSIS DIP (DEVICE) - Abnormal; Notable for the following:    Ketones, ur TRACE (*)    Hgb urine dipstick TRACE (*)    All other components within normal limits  WET PREP, GENITAL - Abnormal; Notable for the  following:    Clue Cells Wet Prep HPF POC MANY (*)    WBC, Wet Prep HPF POC FEW (*)    All other components within normal limits  POCT PREGNANCY, URINE  GC/CHLAMYDIA PROBE AMP, GENITAL   No results found.   1. Bacterial vaginosis   2. Tinea corporis     Had lengthy discussion about bv prevention and tx of recurrent infections.   MDM          Cathlyn Parsons, NP 12/15/11 1232

## 2011-12-15 NOTE — ED Provider Notes (Signed)
Medical screening examination/treatment/procedure(s) were performed by non-physician practitioner and as supervising physician I was immediately available for consultation/collaboration.  Leslee Home, M.D.   Reuben Likes, MD 12/15/11 1256

## 2012-11-27 ENCOUNTER — Emergency Department (HOSPITAL_COMMUNITY)
Admission: EM | Admit: 2012-11-27 | Discharge: 2012-11-27 | Disposition: A | Discharge status: Home / Self Care | Payer: Self-pay | Attending: Emergency Medicine | Admitting: Emergency Medicine

## 2012-11-27 ENCOUNTER — Encounter (HOSPITAL_COMMUNITY): Payer: Self-pay | Admitting: *Deleted

## 2012-11-27 DIAGNOSIS — R197 Diarrhea, unspecified: Secondary | ICD-10-CM | POA: Insufficient documentation

## 2012-11-27 DIAGNOSIS — J029 Acute pharyngitis, unspecified: Secondary | ICD-10-CM | POA: Insufficient documentation

## 2012-11-27 DIAGNOSIS — J45909 Unspecified asthma, uncomplicated: Secondary | ICD-10-CM | POA: Insufficient documentation

## 2012-11-27 DIAGNOSIS — J3489 Other specified disorders of nose and nasal sinuses: Secondary | ICD-10-CM | POA: Insufficient documentation

## 2012-11-27 DIAGNOSIS — R112 Nausea with vomiting, unspecified: Secondary | ICD-10-CM | POA: Insufficient documentation

## 2012-11-27 LAB — RAPID STREP SCREEN (MED CTR MEBANE ONLY): Streptococcus, Group A Screen (Direct): NEGATIVE

## 2012-11-27 MED ORDER — IBUPROFEN 600 MG PO TABS: 600.0000 mg | ORAL_TABLET | Freq: Four times a day (QID) | ORAL | Status: DC | PRN

## 2012-11-27 MED ORDER — ONDANSETRON 8 MG PO TBDP: 8.0000 mg | ORAL_TABLET | Freq: Three times a day (TID) | ORAL | Status: DC | PRN

## 2012-11-27 NOTE — ED Notes (Signed)
Pt from home accompanied by family with reports of a sore throat and emesis that started last night. Pt denies fever and is observed drinking water without difficulty.

## 2012-11-27 NOTE — ED Provider Notes (Signed)
History     CSN: [id number]  Arrival date & time 11/27/12  1033   First MD Initiated Contact with Patient 11/27/12 1041      No chief complaint on file.   (Consider location/radiation/quality/duration/timing/severity/associated sxs/prior treatment) HPI Comments: Patient presents with complaint of intermittent nausea, vomiting, diarrhea for the past 3 days as well as sore throat which began last night. Patient currently does not have nausea or vomiting. Pain is worse with swallowing. She has had runny nose and nasal congestion. No fever. She took Robitussin prior to arrival. No abdominal pain, urinary symptoms. The onset of this condition was acute. The course is constant. Aggravating factors: none. Alleviating factors: none.    The history is provided by the patient.    Past Medical History  Diagnosis Date  . Asthma     Past Surgical History  Procedure Laterality Date  . Eye surgery      No family history on file.  History  Substance Use Topics  . Smoking status: Current Some Day Smoker  . Smokeless tobacco: Current User  . Alcohol Use: 1.2 oz/week    2 Shots of liquor per week     Comment: every other week    OB History   Grav Para Term Preterm Abortions TAB SAB Ect Mult Living                  Review of Systems  Constitutional: Negative for fever and chills.  HENT: Positive for congestion, sore throat and rhinorrhea.   Eyes: Negative for redness.  Respiratory: Negative for cough.   Cardiovascular: Negative for chest pain.  Gastrointestinal: Positive for nausea, vomiting and diarrhea. Negative for abdominal pain and blood in stool.  Genitourinary: Negative for dysuria.  Musculoskeletal: Negative for myalgias.  Skin: Negative for rash.  Neurological: Negative for headaches.    Allergies  Review of patient's allergies indicates no known allergies.  Home Medications   Current Outpatient Rx  Name  Route  Sig  Dispense  Refill  . acetaminophen (TYLENOL)  500 MG tablet   Oral   Take 1,000 mg by mouth every 6 (six) hours as needed for pain.           BP 138/98  Pulse 81  Temp(Src) 98.9 F (37.2 C) (Oral)  Resp 16  SpO2 95%  Physical Exam  Nursing note and vitals reviewed. Constitutional: She appears well-developed and well-nourished.  HENT:  Head: Normocephalic and atraumatic. No trismus in the jaw.  Right Ear: Hearing, tympanic membrane and ear canal normal.  Left Ear: Hearing, tympanic membrane and ear canal normal.  Nose: Nose normal. No mucosal edema or rhinorrhea.  Mouth/Throat: Mucous membranes are normal. Posterior oropharyngeal erythema present. No oropharyngeal exudate, posterior oropharyngeal edema or tonsillar abscesses.  Eyes: Conjunctivae are normal. Right eye exhibits no discharge. Left eye exhibits no discharge.  Neck: Normal range of motion. Neck supple.  Cardiovascular: Normal rate, regular rhythm and normal heart sounds.   Pulmonary/Chest: Effort normal and breath sounds normal.  Abdominal: Soft. There is no tenderness.  Neurological: She is alert.  Skin: Skin is warm and dry.  Psychiatric: She has a normal mood and affect.    ED Course  Procedures (including critical care time)  Labs Reviewed  RAPID STREP SCREEN   No results found.   1. Pharyngitis     11:05 AM Patient seen and examined. Work-up initiated. PO challenge. No current abdominal pain, no N/V/D this AM. Patient's primary complaint is sore throat, will  evaluate with strep screen.   Vital signs reviewed and are as follows: Filed Vitals:   11/27/12 1041  BP: 138/98  Pulse: 81  Temp: 98.9 F (37.2 C)  Resp: 16   11:57 AM Patient informed of neg strep results. She is drinking water in room without vomiting. Patient counseled on supportive care for viral URI and s/s to return including worsening symptoms, persistent fever, persistent vomiting, or if they have any other concerns.  Urged to see PCP if symptoms persist for more than 3 days.  Patient verbalizes understanding and agrees with plan.   D/c home with ibuprofen and zofran.  MDM  Sore throat/URI sx: Strep neg, supportive mgmt  N/V/D: Currently asymptomatic, and soft/NT, no indication for work-up, patient tolerating PO's, no dehydration.        Renne Crigler, PA-C 11/27/12 1159

## 2012-11-28 NOTE — ED Provider Notes (Signed)
Medical screening examination/treatment/procedure(s) were performed by non-physician practitioner and as supervising physician I was immediately available for consultation/collaboration.   Lilianna Case E Khadija Thier, MD 11/28/12 1116 

## 2013-01-02 ENCOUNTER — Emergency Department (HOSPITAL_COMMUNITY)
Admission: EM | Admit: 2013-01-02 | Discharge: 2013-01-02 | Disposition: A | Discharge status: Home / Self Care | Payer: Self-pay | Attending: Emergency Medicine | Admitting: Emergency Medicine

## 2013-01-02 ENCOUNTER — Encounter (HOSPITAL_COMMUNITY): Payer: Self-pay | Admitting: Emergency Medicine

## 2013-01-02 ENCOUNTER — Other Ambulatory Visit: Payer: Self-pay

## 2013-01-02 DIAGNOSIS — F17200 Nicotine dependence, unspecified, uncomplicated: Secondary | ICD-10-CM | POA: Insufficient documentation

## 2013-01-02 DIAGNOSIS — J45909 Unspecified asthma, uncomplicated: Secondary | ICD-10-CM | POA: Insufficient documentation

## 2013-01-02 DIAGNOSIS — G8929 Other chronic pain: Secondary | ICD-10-CM | POA: Insufficient documentation

## 2013-01-02 DIAGNOSIS — J45901 Unspecified asthma with (acute) exacerbation: Secondary | ICD-10-CM

## 2013-01-02 DIAGNOSIS — M545 Low back pain, unspecified: Secondary | ICD-10-CM | POA: Insufficient documentation

## 2013-01-02 DIAGNOSIS — J3489 Other specified disorders of nose and nasal sinuses: Secondary | ICD-10-CM | POA: Insufficient documentation

## 2013-01-02 LAB — URINALYSIS, ROUTINE W REFLEX MICROSCOPIC
Bilirubin Urine: NEGATIVE
Ketones, ur: NEGATIVE mg/dL
Nitrite: NEGATIVE
Protein, ur: NEGATIVE mg/dL
Urobilinogen, UA: 1 mg/dL (ref 0.0–1.0)

## 2013-01-02 LAB — PREGNANCY, URINE: Preg Test, Ur: NEGATIVE

## 2013-01-02 MED ORDER — ALBUTEROL SULFATE (5 MG/ML) 0.5% IN NEBU
5.0000 mg | INHALATION_SOLUTION | Freq: Once | RESPIRATORY_TRACT | Status: AC
  Administered 2013-01-02: 5 mg via RESPIRATORY_TRACT
  Filled 2013-01-02: qty 1

## 2013-01-02 MED ORDER — KETOROLAC TROMETHAMINE 30 MG/ML IJ SOLN
30.0000 mg | Freq: Once | INTRAMUSCULAR | Status: AC
  Administered 2013-01-02: 30 mg via INTRAMUSCULAR
  Filled 2013-01-02: qty 1

## 2013-01-02 MED ORDER — OXYCODONE-ACETAMINOPHEN 5-325 MG PO TABS: 1.0000 | ORAL_TABLET | Freq: Four times a day (QID) | ORAL | Status: DC | PRN

## 2013-01-02 MED ORDER — ALBUTEROL SULFATE HFA 108 (90 BASE) MCG/ACT IN AERS
2.0000 | INHALATION_SPRAY | RESPIRATORY_TRACT | Status: DC | PRN
  Administered 2013-01-02: 2 via RESPIRATORY_TRACT
  Filled 2013-01-02: qty 6.7

## 2013-01-02 MED ORDER — PREDNISONE 20 MG PO TABS: ORAL_TABLET | ORAL | Status: DC

## 2013-01-02 MED ORDER — OXYCODONE-ACETAMINOPHEN 5-325 MG PO TABS
2.0000 | ORAL_TABLET | Freq: Once | ORAL | Status: AC
  Administered 2013-01-02: 2 via ORAL
  Filled 2013-01-02: qty 2

## 2013-01-02 MED ORDER — PREDNISONE 20 MG PO TABS
60.0000 mg | ORAL_TABLET | Freq: Once | ORAL | Status: AC
  Administered 2013-01-02: 60 mg via ORAL
  Filled 2013-01-02: qty 3

## 2013-01-02 NOTE — ED Notes (Signed)
See paper documentations (computer downtime). 

## 2013-01-02 NOTE — ED Provider Notes (Signed)
History     CSN: [id number]  Arrival date & time 01/02/13  0131   First MD Initiated Contact with Patient 01/02/13 0404      Chief Complaint  Patient presents with  . Nasal Congestion  . Back Pain    (Consider location/radiation/quality/duration/timing/severity/associated sxs/prior treatment) HPI Comments: See down time  Patient is a 27 y.o. female presenting with back pain. The history is provided by the patient.  Back Pain Location:  Lumbar spine Quality:  Aching Radiates to:  Does not radiate Pain severity:  Moderate Onset quality:  Gradual Timing:  Constant Chronicity:  Chronic Relieved by:  Nothing Worsened by:  NSAIDs   Past Medical History  Diagnosis Date  . Asthma     Past Surgical History  Procedure Laterality Date  . Eye surgery      History reviewed. No pertinent family history.  History  Substance Use Topics  . Smoking status: Current Some Day Smoker  . Smokeless tobacco: Never Used  . Alcohol Use: 1.2 oz/week    2 Shots of liquor per week     Comment: every other week    OB History   Grav Para Term Preterm Abortions TAB SAB Ect Mult Living                  Review of Systems  Musculoskeletal: Positive for back pain.    Allergies  Review of patient's allergies indicates no known allergies.  Home Medications   Current Outpatient Rx  Name  Route  Sig  Dispense  Refill  . oxyCODONE-acetaminophen (PERCOCET/ROXICET) 5-325 MG per tablet   Oral   Take 1 tablet by mouth every 6 (six) hours as needed for pain.   5 tablet   0   . predniSONE (DELTASONE) 20 MG tablet      3 Tabs PO Days 1-3, then 2 tabs PO Days 4-6, then 1 tab PO Day 7-9, then Half Tab PO Day 10-12   20 tablet   0     BP 137/84  Pulse 94  Temp(Src) 99.4 F (37.4 C) (Oral)  Resp 18  SpO2 100%  Physical Exam  ED Course  Procedures (including critical care time)  Labs Reviewed - No data to display No results found.   1. Chronic low back pain   2. Asthma  attack       MDM   Sedation of chronic low back pain.  Asthma.  Supplied her with a, and inhaler, given her a Pharmacist, hospital.  So she can establish with a primary care physician        Arman Filter, NP 01/02/13 0522

## 2013-01-02 NOTE — ED Provider Notes (Signed)
Medical screening examination/treatment/procedure(s) were performed by non-physician practitioner and as supervising physician I was immediately available for consultation/collaboration.   Rondalyn Belford, MD 01/02/13 0704 

## 2013-01-11 ENCOUNTER — Encounter (HOSPITAL_COMMUNITY): Payer: Self-pay | Admitting: Emergency Medicine

## 2013-01-11 ENCOUNTER — Emergency Department (HOSPITAL_COMMUNITY)
Admission: EM | Admit: 2013-01-11 | Discharge: 2013-01-11 | Disposition: A | Discharge status: Home / Self Care | Payer: Self-pay | Attending: Emergency Medicine | Admitting: Emergency Medicine

## 2013-01-11 DIAGNOSIS — Z3202 Encounter for pregnancy test, result negative: Secondary | ICD-10-CM | POA: Insufficient documentation

## 2013-01-11 DIAGNOSIS — N390 Urinary tract infection, site not specified: Secondary | ICD-10-CM | POA: Insufficient documentation

## 2013-01-11 DIAGNOSIS — M546 Pain in thoracic spine: Secondary | ICD-10-CM | POA: Insufficient documentation

## 2013-01-11 DIAGNOSIS — J45909 Unspecified asthma, uncomplicated: Secondary | ICD-10-CM | POA: Insufficient documentation

## 2013-01-11 DIAGNOSIS — R350 Frequency of micturition: Secondary | ICD-10-CM | POA: Insufficient documentation

## 2013-01-11 DIAGNOSIS — M549 Dorsalgia, unspecified: Secondary | ICD-10-CM

## 2013-01-11 DIAGNOSIS — R3915 Urgency of urination: Secondary | ICD-10-CM | POA: Insufficient documentation

## 2013-01-11 DIAGNOSIS — F17200 Nicotine dependence, unspecified, uncomplicated: Secondary | ICD-10-CM | POA: Insufficient documentation

## 2013-01-11 LAB — URINALYSIS, ROUTINE W REFLEX MICROSCOPIC
Bilirubin Urine: NEGATIVE
Glucose, UA: NEGATIVE mg/dL
Ketones, ur: NEGATIVE mg/dL
Nitrite: NEGATIVE
Protein, ur: NEGATIVE mg/dL
Specific Gravity, Urine: 1.023 (ref 1.005–1.030)
Urobilinogen, UA: 0.2 mg/dL (ref 0.0–1.0)
pH: 5.5 (ref 5.0–8.0)

## 2013-01-11 LAB — URINE MICROSCOPIC-ADD ON

## 2013-01-11 MED ORDER — HYDROCODONE-ACETAMINOPHEN 5-325 MG PO TABS
2.0000 | ORAL_TABLET | Freq: Once | ORAL | Status: AC
Start: 2013-01-11 — End: 2013-01-11
  Administered 2013-01-11: 2 via ORAL
  Filled 2013-01-11: qty 2

## 2013-01-11 MED ORDER — HYDROCODONE-ACETAMINOPHEN 5-325 MG PO TABS: 2.0000 | ORAL_TABLET | Freq: Four times a day (QID) | ORAL | Status: DC | PRN

## 2013-01-11 MED ORDER — ONDANSETRON 8 MG PO TBDP
8.0000 mg | ORAL_TABLET | Freq: Once | ORAL | Status: AC
  Administered 2013-01-11: 8 mg via ORAL
  Filled 2013-01-11: qty 1

## 2013-01-11 MED ORDER — PROMETHAZINE HCL 25 MG PO TABS: 25.0000 mg | ORAL_TABLET | Freq: Four times a day (QID) | ORAL | Status: DC | PRN

## 2013-01-11 MED ORDER — CIPROFLOXACIN HCL 500 MG PO TABS: 500.0000 mg | ORAL_TABLET | Freq: Two times a day (BID) | ORAL | Status: DC

## 2013-01-11 NOTE — ED Notes (Signed)
Per pt, burnning with urination and back pain

## 2013-01-11 NOTE — ED Provider Notes (Signed)
History     CSN: [id number]  Arrival date & time 01/11/13  1536   First MD Initiated Contact with Patient 01/11/13 1734      Chief Complaint  Patient presents with  . Dysuria    (Consider location/radiation/quality/duration/timing/severity/associated sxs/prior treatment) HPI Comments: Patient is a 27 year old female who presents today with 2 days worsening of dysuria, urinary urgency, urinary frequency. She also complains of left sided upper back pain x1 week. She was evaluated for this previously and given Percocet. Percocet has helped her pain, but she has run out of Percocet. She does not remember injuring her back. No bowel or bladder incontinence, history of cancer, IV drug abuse. She describes the pain as aching and crampy. It does not radiate. She denies fevers, chills, nausea, vomiting, abdominal pain, numbness, tingling, paresthesias.  Patient is a 27 y.o. female presenting with dysuria. The history is provided by the patient. No language interpreter was used.  Dysuria Associated symptoms: no abdominal pain, no fever, no nausea, no vaginal discharge and no vomiting     Past Medical History  Diagnosis Date  . Asthma     Past Surgical History  Procedure Laterality Date  . Eye surgery      No family history on file.  History  Substance Use Topics  . Smoking status: Current Some Day Smoker  . Smokeless tobacco: Never Used  . Alcohol Use: 1.2 oz/week    2 Shots of liquor per week     Comment: every other week    OB History   Grav Para Term Preterm Abortions TAB SAB Ect Mult Living                  Review of Systems  Constitutional: Negative for fever and chills.  Gastrointestinal: Negative for nausea, vomiting and abdominal pain.  Genitourinary: Positive for dysuria, urgency and frequency. Negative for hematuria, vaginal bleeding, vaginal discharge and vaginal pain.  Musculoskeletal: Positive for back pain.  All other systems reviewed and are  negative.    Allergies  Review of patient's allergies indicates no known allergies.  Home Medications   Current Outpatient Rx  Name  Route  Sig  Dispense  Refill  . oxyCODONE-acetaminophen (PERCOCET/ROXICET) 5-325 MG per tablet   Oral   Take 1 tablet by mouth every 6 (six) hours as needed for pain.   5 tablet   0     BP 137/99  Pulse 94  Temp(Src) 98.8 F (37.1 C) (Oral)  Resp 18  SpO2 100%  LMP 01/04/2013  Physical Exam  Nursing note and vitals reviewed. Constitutional: She is oriented to person, place, and time. She appears well-developed and well-nourished. No distress.  HENT:  Head: Normocephalic and atraumatic.  Right Ear: External ear normal.  Left Ear: External ear normal.  Nose: Nose normal.  Mouth/Throat: Oropharynx is clear and moist.  Eyes: Conjunctivae are normal.  Neck: Normal range of motion.  Cardiovascular: Normal rate, regular rhythm and normal heart sounds.   Pulmonary/Chest: Effort normal and breath sounds normal. No stridor. No respiratory distress. She has no wheezes. She has no rales.  Abdominal: Soft. She exhibits no distension. There is tenderness in the suprapubic area. There is no rigidity, no rebound, no guarding and no CVA tenderness.  Musculoskeletal: Normal range of motion.       Cervical back: She exhibits normal range of motion, no tenderness, no bony tenderness, no swelling and no deformity.       Thoracic back: She exhibits tenderness. She  exhibits normal range of motion, no bony tenderness and no deformity.       Lumbar back: She exhibits normal range of motion, no tenderness, no bony tenderness and no deformity.  no deformity, step-off Tenderness to palpation on left thoracic area, no bony tenderness  Neurological: She is alert and oriented to person, place, and time. She has normal strength.  Skin: Skin is warm and dry. She is not diaphoretic. No erythema.  Psychiatric: She has a normal mood and affect. Her behavior is normal.     ED Course  Procedures (including critical care time)  Labs Reviewed  URINALYSIS, ROUTINE W REFLEX MICROSCOPIC - Abnormal; Notable for the following:    APPearance CLOUDY (*)    Hgb urine dipstick TRACE (*)    Leukocytes, UA LARGE (*)    All other components within normal limits  URINE MICROSCOPIC-ADD ON - Abnormal; Notable for the following:    Bacteria, UA MANY (*)    All other components within normal limits  URINE CULTURE  POCT PREGNANCY, URINE   No results found.   1. Urinary tract infection   2. Back pain       MDM  Patient with back pain.  No neurological deficits and normal neuro exam.  Patient can walk but states is painful.  No loss of bowel or bladder control.  No concern for cauda equina.  No fever, night sweats, weight loss, h/o cancer, IVDU.  RICE protocol and pain medicine indicated and discussed with patient. Pt has been diagnosed with a UTI. Pt is afebrile, no CVA tenderness, normotensive, and denies N/V. Pt to be dc home with antibiotics and instructions to follow up with PCP if symptoms persist.         Mora Bellman, PA-C 01/12/13 1255

## 2013-01-12 NOTE — ED Provider Notes (Signed)
Medical screening examination/treatment/procedure(s) were performed by non-physician practitioner and as supervising physician I was immediately available for consultation/collaboration.  Monalisa Bayless, MD 01/12/13 1459 

## 2013-01-14 LAB — URINE CULTURE: Colony Count: >=100000

## 2013-01-16 NOTE — ED Notes (Signed)
Post ED Visit - Positive Culture Follow-up  Culture report reviewed by antimicrobial stewardship pharmacist: []  Wes Dulaney, Pharm.D., BCPS [x]  Celedonio Miyamoto, Pharm.D., BCPS []  Georgina Pillion, Pharm.D., BCPS []  [place], [address].D., BCPS, AAHIVP []  Estella Husk, Pharm.D., BCPS, AAHIVP  Positive urine culture Treated with Ciprofloxacin, organism sensitive to the same and no further patient follow-up is required at this time.  Larena Sox 01/16/2013, 2:48 PM

## 2013-04-08 ENCOUNTER — Emergency Department (HOSPITAL_COMMUNITY)
Admission: EM | Admit: 2013-04-08 | Discharge: 2013-04-08 | Disposition: A | Discharge status: Home / Self Care | Payer: Medicaid Other | Attending: Emergency Medicine | Admitting: Emergency Medicine

## 2013-04-08 ENCOUNTER — Encounter (HOSPITAL_COMMUNITY): Payer: Self-pay | Admitting: Family Medicine

## 2013-04-08 DIAGNOSIS — J45909 Unspecified asthma, uncomplicated: Secondary | ICD-10-CM | POA: Insufficient documentation

## 2013-04-08 DIAGNOSIS — O9989 Other specified diseases and conditions complicating pregnancy, childbirth and the puerperium: Secondary | ICD-10-CM | POA: Insufficient documentation

## 2013-04-08 DIAGNOSIS — F17200 Nicotine dependence, unspecified, uncomplicated: Secondary | ICD-10-CM | POA: Insufficient documentation

## 2013-04-08 DIAGNOSIS — R51 Headache: Secondary | ICD-10-CM | POA: Insufficient documentation

## 2013-04-08 DIAGNOSIS — O210 Mild hyperemesis gravidarum: Secondary | ICD-10-CM | POA: Insufficient documentation

## 2013-04-08 LAB — URINALYSIS, ROUTINE W REFLEX MICROSCOPIC
Bilirubin Urine: NEGATIVE
Ketones, ur: NEGATIVE mg/dL
Leukocytes, UA: NEGATIVE
Nitrite: NEGATIVE
Protein, ur: NEGATIVE mg/dL
Urobilinogen, UA: 1 mg/dL (ref 0.0–1.0)

## 2013-04-08 LAB — CBC WITH DIFFERENTIAL/PLATELET
Basophils Relative: 0 % (ref 0–1)
HCT: 37.9 % (ref 36.0–46.0)
Hemoglobin: 13 g/dL (ref 12.0–15.0)
Lymphs Abs: 2.4 10*3/uL (ref 0.7–4.0)
MCH: 29.4 pg (ref 26.0–34.0)
MCHC: 34.3 g/dL (ref 30.0–36.0)
Monocytes Absolute: 0.4 10*3/uL (ref 0.1–1.0)
Monocytes Relative: 6 % (ref 3–12)
Neutro Abs: 3.5 10*3/uL (ref 1.7–7.7)
RBC: 4.42 MIL/uL (ref 3.87–5.11)

## 2013-04-08 LAB — COMPREHENSIVE METABOLIC PANEL
Albumin: 4 g/dL (ref 3.5–5.2)
Alkaline Phosphatase: 47 U/L (ref 39–117)
BUN: 14 mg/dL (ref 6–23)
CO2: 24 mEq/L (ref 19–32)
Chloride: 100 mEq/L (ref 96–112)
Creatinine, Ser: 0.83 mg/dL (ref 0.50–1.10)
GFR calc Af Amer: >90 mL/min (ref >90)
GFR calc non Af Amer: >90 mL/min (ref >90)
Glucose, Bld: 91 mg/dL (ref 70–99)
Total Bilirubin: 0.4 mg/dL (ref 0.3–1.2)

## 2013-04-08 LAB — LIPASE, BLOOD: Lipase: 24 U/L (ref 11–59)

## 2013-04-08 MED ORDER — SODIUM CHLORIDE 0.9 % IV BOLUS (SEPSIS)
1000.0000 mL | Freq: Once | INTRAVENOUS | Status: AC
  Administered 2013-04-08: 1000 mL via INTRAVENOUS

## 2013-04-08 MED ORDER — PRENATAL COMPLETE 14-0.4 MG PO TABS: 1.0000 | ORAL_TABLET | Freq: Every day | ORAL | Status: DC

## 2013-04-08 MED ORDER — DIPHENHYDRAMINE HCL 50 MG/ML IJ SOLN
25.0000 mg | Freq: Once | INTRAMUSCULAR | Status: AC
  Administered 2013-04-08: 25 mg via INTRAVENOUS
  Filled 2013-04-08: qty 1

## 2013-04-08 MED ORDER — METOCLOPRAMIDE HCL 5 MG/ML IJ SOLN
10.0000 mg | Freq: Once | INTRAMUSCULAR | Status: AC
  Administered 2013-04-08: 10 mg via INTRAVENOUS
  Filled 2013-04-08: qty 2

## 2013-04-08 MED ORDER — METOCLOPRAMIDE HCL 10 MG PO TABS: 10.0000 mg | ORAL_TABLET | Freq: Four times a day (QID) | ORAL | Status: DC

## 2013-04-08 NOTE — ED Provider Notes (Signed)
CSN: [id number]     Arrival date & time 04/08/13  1949 History   First MD Initiated Contact with Patient 04/08/13 2147     Chief Complaint  Patient presents with  . Emesis  . Headache   (Consider location/radiation/quality/duration/timing/severity/associated sxs/prior Treatment) The history is provided by the patient.  Jessica Walton is a 27 y.o. female history of asthma here presenting with headache and vomiting. Intermittent frontal headaches for the last week. It is associated with several episodes of vomiting for the last week. Denies any, pain except with vomiting. She notes that her last as her period was August 5 she states that she may be pregnant. She denies any vaginal bleeding or cramps or vaginal discharge. Denies any fevers chills. Has been taking Tylenol with minimal relief.   Past Medical History  Diagnosis Date  . Asthma    Past Surgical History  Procedure Laterality Date  . Eye surgery     No family history on file. History  Substance Use Topics  . Smoking status: Current Some Day Smoker -- 0.50 packs/day    Types: Cigarettes  . Smokeless tobacco: Never Used  . Alcohol Use: No   OB History   Grav Para Term Preterm Abortions TAB SAB Ect Mult Living                 Review of Systems  Gastrointestinal: Positive for vomiting.  Neurological: Positive for headaches.  All other systems reviewed and are negative.    Allergies  Review of patient's allergies indicates no known allergies.  Home Medications   Current Outpatient Rx  Name  Route  Sig  Dispense  Refill  . acetaminophen (TYLENOL) 500 MG tablet   Oral   Take 1,000 mg by mouth every 6 (six) hours as needed for pain.          BP 141/86  Pulse 87  Temp(Src) 98.8 F (37.1 C) (Oral)  Resp 18  Ht 5\' 9"  (1.753 m)  Wt 180 lb (81.647 kg)  BMI 26.57 kg/m2  SpO2 99%  LMP 02/26/2013 Physical Exam  Nursing note and vitals reviewed. Constitutional: She is oriented to person, place, and time. She  appears well-developed and well-nourished.  HENT:  Head: Normocephalic and atraumatic.  Mouth/Throat: Oropharynx is clear and moist.  Eyes: Conjunctivae are normal. Pupils are equal, round, and reactive to light.  Neck: Normal range of motion. Neck supple.  Cardiovascular: Normal rate, regular rhythm and normal heart sounds.   Pulmonary/Chest: Effort normal and breath sounds normal. No respiratory distress. She has no wheezes. She has no rales.  Abdominal: Soft. Bowel sounds are normal. She exhibits no distension. There is no tenderness. There is no rebound and no guarding.  Musculoskeletal: Normal range of motion.  Neurological: She is alert and oriented to person, place, and time.  Skin: Skin is warm and dry.  Psychiatric: She has a normal mood and affect. Her behavior is normal. Judgment and thought content normal.    ED Course  Procedures (including critical care time) Labs Review Labs Reviewed  COMPREHENSIVE METABOLIC PANEL - Abnormal; Notable for the following:    Sodium 134 (*)    Potassium 3.4 (*)    All other components within normal limits  URINALYSIS, ROUTINE W REFLEX MICROSCOPIC - Abnormal; Notable for the following:    APPearance CLOUDY (*)    Specific Gravity, Urine 1.033 (*)    All other components within normal limits  PREGNANCY, URINE - Abnormal; Notable for the following:  Preg Test, Ur POSITIVE (*)    All other components within normal limits  CBC WITH DIFFERENTIAL  LIPASE, BLOOD   Imaging Review No results found.  MDM  No diagnosis found. Jessica Walton is a 27 y.o. female here with headache, vomiting. Consider migraines vs hyperemesis. Will check labs and pregnancy test. Will hydrate and reassess.   11:13 PM UCG positive. Labs unremarkable. Patient given 1 L NS and reglan and felt better. Recommend f/u at [facility] for prenatal care. Recommend starting prenatal vitamins and reglan prn nausea.    Richardean Canal, MD 04/08/13 [phone]

## 2013-04-08 NOTE — ED Notes (Signed)
Patient states that she has had a headache and vomiting for a week. Has been taking Tylenol with minimal relief of symptoms.

## 2013-04-12 ENCOUNTER — Inpatient Hospital Stay (HOSPITAL_COMMUNITY)
Admission: AD | Admit: 2013-04-12 | Discharge: 2013-04-12 | Disposition: A | Discharge status: Home / Self Care | Payer: Medicaid Other | Source: Ambulatory Visit | Attending: Obstetrics & Gynecology | Admitting: Obstetrics & Gynecology

## 2013-04-12 ENCOUNTER — Encounter (HOSPITAL_COMMUNITY): Payer: Self-pay | Admitting: *Deleted

## 2013-04-12 ENCOUNTER — Inpatient Hospital Stay (HOSPITAL_COMMUNITY): Payer: Medicaid Other

## 2013-04-12 DIAGNOSIS — O209 Hemorrhage in early pregnancy, unspecified: Secondary | ICD-10-CM | POA: Insufficient documentation

## 2013-04-12 DIAGNOSIS — O4690 Antepartum hemorrhage, unspecified, unspecified trimester: Secondary | ICD-10-CM

## 2013-04-12 LAB — CBC
HCT: 36.2 % (ref 36.0–46.0)
MCV: 85.4 fL (ref 78.0–100.0)
Platelets: 293 10*3/uL (ref 150–400)
RBC: 4.24 MIL/uL (ref 3.87–5.11)
RDW: 15.1 % (ref 11.5–15.5)
WBC: 7 10*3/uL (ref 4.0–10.5)

## 2013-04-12 LAB — URINALYSIS, ROUTINE W REFLEX MICROSCOPIC
Bilirubin Urine: NEGATIVE
Glucose, UA: NEGATIVE mg/dL
Protein, ur: NEGATIVE mg/dL
Urobilinogen, UA: 0.2 mg/dL (ref 0.0–1.0)

## 2013-04-12 LAB — URINE MICROSCOPIC-ADD ON

## 2013-04-12 LAB — ABO/RH: ABO/RH(D): A POS

## 2013-04-12 NOTE — MAU Note (Signed)
Was at [facility] on the 15th (nausea and dizziness- found out preg). Started spotting this morning.

## 2013-04-12 NOTE — MAU Provider Note (Signed)
History     CSN: [id number]  Arrival date and time: 04/12/13 7829   First Provider Initiated Contact with Patient 04/12/13 1001      Chief Complaint  Patient presents with  . Vaginal Bleeding   HPI Jessica Walton is a 27yo G2P1001 at [redacted]w[redacted]d (by LMP) who presents with light vaginal bleeding. It began this morning and has been intermittent and scant without any clots. She currently denies any associated symptoms including abd pain, vaginal D/C, dizziness, HA, N, or V; though, she recently had HA, N, and V that took her to the ED at [facility]. Her first pregnancy was full term, but considered high risk because of threatening preterm labor with cervical shortening at 5 months. This pregnancy also had spotting during the 1st trimester. She smokes 2 cigarettes per day. No PNC thus far.  Past Medical History  Diagnosis Date  . Asthma     Past Surgical History  Procedure Laterality Date  . Eye surgery      Family History  Problem Relation Age of Onset  . Hypertension Maternal Grandmother   . Diabetes Maternal Grandmother     History  Substance Use Topics  . Smoking status: Current Some Day Smoker -- 0.50 packs/day    Types: Cigarettes  . Smokeless tobacco: Never Used  . Alcohol Use: No    Allergies: No Known Allergies  Prescriptions prior to admission  Medication Sig Dispense Refill  . acetaminophen (TYLENOL) 325 MG tablet Take 650 mg by mouth every 6 (six) hours as needed for pain.      Marland Kitchen metoCLOPramide (REGLAN) 10 MG tablet Take 1 tablet (10 mg total) by mouth every 6 (six) hours.  15 tablet  0  . Prenatal Vit-Fe Fumarate-FA (PRENATAL COMPLETE) 14-0.4 MG TABS Take 1 tablet by mouth daily.  60 each  0    Review of Systems  Constitutional: Negative for fever.  Eyes: Negative for blurred vision.  Respiratory: Negative for shortness of breath.   Cardiovascular: Negative for chest pain.  Gastrointestinal: Negative for nausea, vomiting and abdominal pain.  Genitourinary: Positive  for frequency.       Denies vaginal bleeding, vaginal d/c, contractions, cramps, and dysuria.  Musculoskeletal: Negative for back pain.  Neurological: Negative for dizziness and headaches.   Physical Exam   Blood pressure 129/77, pulse 73, temperature 99.1 F (37.3 C), temperature source Oral, resp. rate 18, height 5' 7.5" (1.715 m), weight 194 lb (87.998 kg), last menstrual period 02/26/2013.  Physical Exam  Constitutional: She appears well-developed and well-nourished. No distress.  HENT:  Head: Normocephalic and atraumatic.  Eyes: Conjunctivae are normal. Pupils are equal, round, and reactive to light.  Cardiovascular: Normal rate, regular rhythm, normal heart sounds and intact distal pulses.  Exam reveals no gallop and no friction rub.   No murmur heard. Respiratory: Effort normal and breath sounds normal.  GI: Soft. Bowel sounds are normal. She exhibits no distension and no mass. There is no tenderness. There is no rebound and no guarding.  Genitourinary: There is no rash, tenderness, lesion or injury on the right labia. There is no rash, tenderness, lesion or injury on the left labia. Cervix exhibits discharge (Scant white d/c surrounding cervix). Cervix exhibits no motion tenderness and no friability. Right adnexum displays no mass, no tenderness and no fullness. Left adnexum displays no mass, no tenderness and no fullness.  No blood seen during speculum exam. Cervical os closed.  Skin: She is not diaphoretic.    MAU Course  Procedures Results for orders placed during the hospital encounter of 04/12/13 (from the past 24 hour(s))  URINALYSIS, ROUTINE W REFLEX MICROSCOPIC     Status: Abnormal   Collection Time    04/12/13  9:30 AM      Result Value Range   Color, Urine YELLOW  YELLOW   APPearance CLEAR  CLEAR   Specific Gravity, Urine 1.025  1.005 - 1.030   pH 6.0  5.0 - 8.0   Glucose, UA NEGATIVE  NEGATIVE mg/dL   Hgb urine dipstick TRACE (*) NEGATIVE   Bilirubin Urine  NEGATIVE  NEGATIVE   Ketones, ur NEGATIVE  NEGATIVE mg/dL   Protein, ur NEGATIVE  NEGATIVE mg/dL   Urobilinogen, UA 0.2  0.0 - 1.0 mg/dL   Nitrite NEGATIVE  NEGATIVE   Leukocytes, UA NEGATIVE  NEGATIVE  URINE MICROSCOPIC-ADD ON     Status: Abnormal   Collection Time    04/12/13  9:30 AM      Result Value Range   Squamous Epithelial / LPF FEW (*) RARE   WBC, UA 0-2  <3 WBC/hpf   RBC / HPF 3-6  <3 RBC/hpf   Urine-Other MUCOUS PRESENT    WET PREP, GENITAL     Status: Abnormal   Collection Time    04/12/13 11:12 AM      Result Value Range   Yeast Wet Prep HPF POC NONE SEEN  NONE SEEN   Trich, Wet Prep NONE SEEN  NONE SEEN   Clue Cells Wet Prep HPF POC NONE SEEN  NONE SEEN   WBC, Wet Prep HPF POC FEW (*) NONE SEEN  CBC     Status: None   Collection Time    04/12/13 11:44 AM      Result Value Range   WBC 7.0  4.0 - 10.5 K/uL   RBC 4.24  3.87 - 5.11 MIL/uL   Hemoglobin 12.5  12.0 - 15.0 g/dL   HCT 40.9  81.1 - 91.4 %   MCV 85.4  78.0 - 100.0 fL   MCH 29.5  26.0 - 34.0 pg   MCHC 34.5  30.0 - 36.0 g/dL   RDW 78.2  95.6 - 21.3 %   Platelets 293  150 - 400 K/uL   US Ob Comp Less 14 Wks  04/12/2013   *RADIOLOGY REPORT*  Clinical Data: Vaginal bleeding, positive pregnancy test  OBSTETRIC <14 WK ULTRASOUND  Technique:  Transabdominal ultrasound was performed for evaluation of the gestation as well as the maternal uterus and adnexal regions.  Comparison:  None.  Intrauterine gestational sac: Visualized/normal in shape. Yolk sac: Visualized Embryo: Visualized Cardiac Activity: Visualized Heart Rate: 128 bpm  CRL:  56 mm   w  3 d          [place] EDC: 12/03/13  Maternal uterus/Adnexae: Left fundal uterine fibroid noted measuring 3.3 x 3.0 x 2.5 cm. Normal ovaries.  Small free fluid.  IMPRESSION: Intrauterine gestational sac, yolk sac, fetal pole, and cardiac activity.  Concordant measurements by today's exam with assigned gestational age by LMP of 6 weeks 3 days, EDC by LMP 12/03/2013. No acute  abnormality.   Original Report Authenticated By: Christiana Pellant, M.D.   US Ob Transvaginal  04/12/2013   *RADIOLOGY REPORT*  Clinical Data: Vaginal bleeding, positive pregnancy test  OBSTETRIC <14 WK ULTRASOUND  Technique:  Transabdominal ultrasound was performed for evaluation of the gestation as well as the maternal uterus and adnexal regions.  Comparison:  None.  Intrauterine gestational sac: Visualized/normal in  shape. Yolk sac: Visualized Embryo: Visualized Cardiac Activity: Visualized Heart Rate: 128 bpm  CRL:  56 mm   w  3 d          [place] EDC: 12/03/13  Maternal uterus/Adnexae: Left fundal uterine fibroid noted measuring 3.3 x 3.0 x 2.5 cm. Normal ovaries.  Small free fluid.  IMPRESSION: Intrauterine gestational sac, yolk sac, fetal pole, and cardiac activity.  Concordant measurements by today's exam with assigned gestational age by LMP of 6 weeks 3 days, EDC by LMP 12/03/2013. No acute abnormality.   Original Report Authenticated By: Christiana Pellant, M.D.     MDM Normal GU exam without bloody d/c, open Cervical Os, or CMT is reassuring. TVUS confirms IUGS and further predicts against Ectopic Pregnancy. Grossly negative CBC, UA, and Wet Prep help suggest against infection and anemia.   Assessment and Plan  #Vaginal Bleeding, 1st Trimester -Reassure patient -Set up [facility]. Referral sent to [facility], they will call patient with an appointment. Medicaid assistance information given  -Return if symptoms worsen.  -Bleeding precautions discussed  LICHTENSTEIN, JONATHAN 04/12/2013, 12:04 PM   I have seen and evaluated the patient with the PA student. I agree with the assessment and plan as written above.   Freddi Starr, PA-C 04/12/2013 2:11 PM

## 2013-04-14 LAB — GC/CHLAMYDIA PROBE AMP
CT Probe RNA: NEGATIVE
GC Probe RNA: NEGATIVE

## 2013-05-09 ENCOUNTER — Encounter: Payer: Self-pay | Admitting: Family Medicine

## 2013-05-10 ENCOUNTER — Encounter (HOSPITAL_COMMUNITY): Payer: Self-pay

## 2013-05-10 ENCOUNTER — Inpatient Hospital Stay (HOSPITAL_COMMUNITY)
Admission: AD | Admit: 2013-05-10 | Discharge: 2013-05-10 | Disposition: A | Discharge status: Home / Self Care | Payer: Medicaid Other | Source: Ambulatory Visit | Attending: Obstetrics & Gynecology | Admitting: Obstetrics & Gynecology

## 2013-05-10 DIAGNOSIS — O26859 Spotting complicating pregnancy, unspecified trimester: Secondary | ICD-10-CM | POA: Insufficient documentation

## 2013-05-10 DIAGNOSIS — Z3490 Encounter for supervision of normal pregnancy, unspecified, unspecified trimester: Secondary | ICD-10-CM

## 2013-05-10 DIAGNOSIS — N939 Abnormal uterine and vaginal bleeding, unspecified: Secondary | ICD-10-CM

## 2013-05-10 DIAGNOSIS — N898 Other specified noninflammatory disorders of vagina: Secondary | ICD-10-CM

## 2013-05-10 NOTE — MAU Note (Signed)
Pink spotting on toilet paper tonight. Last intercourse yesterday. Denies vaginal discharge or abdominal pain.

## 2013-05-10 NOTE — MAU Provider Note (Signed)
  History     CSN: [id number]  Arrival date and time: 05/10/13 0213   First Provider Initiated Contact with Patient 05/10/13 0255      Chief Complaint  Patient presents with  . Vaginal Bleeding   HPI Comments: Jessica Walton 26 [id number].Q.[id number] presents to MAU for spotting at 10 weeks and 3 days pregnant. She had intercourse yesterday. + FHT. U/S done at last visit  On 04/12/13 showed IUP. Will start prenatal care at [facility].     Patient is a 27 y.o. female presenting with vaginal bleeding.  Vaginal Bleeding      Past Medical History  Diagnosis Date  . Asthma     Past Surgical History  Procedure Laterality Date  . Eye surgery      Family History  Problem Relation Age of Onset  . Hypertension Maternal Grandmother   . Diabetes Maternal Grandmother     History  Substance Use Topics  . Smoking status: Current Some Day Smoker -- 0.50 packs/day    Types: Cigarettes  . Smokeless tobacco: Never Used  . Alcohol Use: No    Allergies: No Known Allergies  Prescriptions prior to admission  Medication Sig Dispense Refill  . acetaminophen (TYLENOL) 325 MG tablet Take 650 mg by mouth every 6 (six) hours as needed for pain.      Marland Kitchen metoCLOPramide (REGLAN) 10 MG tablet Take 1 tablet (10 mg total) by mouth every 6 (six) hours.  15 tablet  0  . Prenatal Vit-Fe Fumarate-FA (PRENATAL COMPLETE) 14-0.4 MG TABS Take 1 tablet by mouth daily.  60 each  0    Review of Systems  Constitutional: Negative.   HENT: Negative.   Respiratory: Negative.   Cardiovascular: Negative.   Gastrointestinal: Negative.   Genitourinary: Positive for vaginal bleeding.       Vaginal spotting  Musculoskeletal: Negative.   Skin: Negative.   Neurological: Negative.   Psychiatric/Behavioral: Negative.    Physical Exam   Blood pressure 145/84, pulse 82, temperature 99 F (37.2 C), temperature source Oral, resp. rate 18, height 5\' 9"  (1.753 m), weight 189 lb 12.8 oz (86.093 kg), last menstrual  period 02/26/2013.  Physical Exam  Constitutional: She is oriented to person, place, and time. She appears well-developed and well-nourished. No distress.  HENT:  Head: Normocephalic.  Cardiovascular: Normal rate, regular rhythm and normal heart sounds.   Respiratory: Effort normal and breath sounds normal. No respiratory distress.  GI: Soft. Bowel sounds are normal. She exhibits no distension. There is no tenderness.  Genitourinary:  Genital: External: Negative Vaginal: Minimal amount of pink discharge on Fox swab Cervix: closed Biman: Negative  Musculoskeletal: Normal range of motion.  Neurological: She is alert and oriented to person, place, and time.  Skin: Skin is warm and dry.  Psychiatric: She has a normal mood and affect. Her behavior is normal. Judgment and thought content normal.     MAU Course  Procedures  MDM    Assessment and Plan   A: Spotting in first trimester P: Reassurance, pelvic rest Follow up with [facility] 05/10/2013, 3:02 AM

## 2013-05-13 NOTE — MAU Provider Note (Signed)

## 2013-06-23 ENCOUNTER — Encounter (HOSPITAL_COMMUNITY): Payer: Self-pay

## 2013-06-23 ENCOUNTER — Inpatient Hospital Stay (HOSPITAL_COMMUNITY)
Admission: AD | Admit: 2013-06-23 | Discharge: 2013-06-23 | Disposition: A | Discharge status: Home / Self Care | Payer: Medicaid Other | Source: Ambulatory Visit | Attending: Obstetrics and Gynecology | Admitting: Obstetrics and Gynecology

## 2013-06-23 DIAGNOSIS — O99891 Other specified diseases and conditions complicating pregnancy: Secondary | ICD-10-CM | POA: Insufficient documentation

## 2013-06-23 DIAGNOSIS — O26899 Other specified pregnancy related conditions, unspecified trimester: Secondary | ICD-10-CM

## 2013-06-23 DIAGNOSIS — R109 Unspecified abdominal pain: Secondary | ICD-10-CM | POA: Insufficient documentation

## 2013-06-23 DIAGNOSIS — O210 Mild hyperemesis gravidarum: Secondary | ICD-10-CM | POA: Insufficient documentation

## 2013-06-23 DIAGNOSIS — O219 Vomiting of pregnancy, unspecified: Secondary | ICD-10-CM

## 2013-06-23 LAB — URINALYSIS, ROUTINE W REFLEX MICROSCOPIC
Leukocytes, UA: NEGATIVE
Nitrite: NEGATIVE
Specific Gravity, Urine: >1.03 — ABNORMAL HIGH (ref 1.005–1.030)
Urobilinogen, UA: 0.2 mg/dL (ref 0.0–1.0)
pH: 6 (ref 5.0–8.0)

## 2013-06-23 LAB — WET PREP, GENITAL
Trich, Wet Prep: NONE SEEN
Yeast Wet Prep HPF POC: NONE SEEN

## 2013-06-23 MED ORDER — PROMETHAZINE HCL 12.5 MG PO TABS: 12.5000 mg | ORAL_TABLET | Freq: Four times a day (QID) | ORAL | Status: DC | PRN

## 2013-06-23 MED ORDER — PROMETHAZINE HCL 25 MG PO TABS
25.0000 mg | ORAL_TABLET | Freq: Once | ORAL | Status: AC
  Administered 2013-06-23: 25 mg via ORAL
  Filled 2013-06-23: qty 1

## 2013-06-23 NOTE — MAU Note (Signed)
Pt reports "bad stomach cramps" x 2 days, also reports vomiting today. Denies fever, denies dysuria.

## 2013-06-23 NOTE — MAU Provider Note (Signed)
History     CSN: [id number]  Arrival date and time: 06/23/13 2027   First Provider Initiated Contact with Patient 06/23/13 2218      Chief Complaint  Patient presents with  . Abdominal Pain  . Emesis   HPI  Jessica Walton is a 27 y.o. [id number] at [redacted]w[redacted]d who presents today with cramping and nausea and vomiting. She states that she has vomited twice today. She denies any bleeding or LOF. She is planning to have care at the [facility] 2/2 preterm labor and delivery with her last child, but she has not started [facility] at this time.   Past Medical History  Diagnosis Date  . Asthma     Past Surgical History  Procedure Laterality Date  . Eye surgery      Family History  Problem Relation Age of Onset  . Hypertension Maternal Grandmother   . Diabetes Maternal Grandmother     History  Substance Use Topics  . Smoking status: Current Some Day Smoker -- 0.50 packs/day    Types: Cigarettes  . Smokeless tobacco: Never Used  . Alcohol Use: No    Allergies: No Known Allergies  No prescriptions prior to admission    ROS Physical Exam   Blood pressure 119/71, pulse 92, temperature 98.7 F (37.1 C), temperature source Oral, resp. rate 20, height 5\' 9"  (1.753 m), weight 83.915 kg (185 lb), last menstrual period 02/26/2013, SpO2 98.00%.  Physical Exam  Nursing note and vitals reviewed. Constitutional: She is oriented to person, place, and time. She appears well-developed and well-nourished. No distress.  Cardiovascular: Normal rate.   Respiratory: Effort normal.  GI: Soft. There is no tenderness.  Genitourinary:   External: no lesion Vagina: small amount of white discharge Cervix: pink, smooth, closed/thick/high  Uterus: AGA   Neurological: She is alert and oriented to person, place, and time.  Skin: Skin is warm and dry.  Psychiatric: She has a normal mood and affect.    MAU Course  Procedures  Results for orders placed during the hospital encounter of 06/23/13 (from the  past 24 hour(s))  URINALYSIS, ROUTINE W REFLEX MICROSCOPIC     Status: Abnormal   Collection Time    06/23/13  8:51 PM      Result Value Range   Color, Urine YELLOW  YELLOW   APPearance CLEAR  CLEAR   Specific Gravity, Urine >1.030 (*) 1.005 - 1.030   pH 6.0  5.0 - 8.0   Glucose, UA NEGATIVE  NEGATIVE mg/dL   Hgb urine dipstick NEGATIVE  NEGATIVE   Bilirubin Urine NEGATIVE  NEGATIVE   Ketones, ur NEGATIVE  NEGATIVE mg/dL   Protein, ur NEGATIVE  NEGATIVE mg/dL   Urobilinogen, UA 0.2  0.0 - 1.0 mg/dL   Nitrite NEGATIVE  NEGATIVE   Leukocytes, UA NEGATIVE  NEGATIVE  WET PREP, GENITAL     Status: Abnormal   Collection Time    06/23/13 10:27 PM      Result Value Range   Yeast Wet Prep HPF POC NONE SEEN  NONE SEEN   Trich, Wet Prep NONE SEEN  NONE SEEN   Clue Cells Wet Prep HPF POC NONE SEEN  NONE SEEN   WBC, Wet Prep HPF POC FEW (*) NONE SEEN   Nausea improved with phenergan.   Assessment and Plan   1. Nausea and vomiting in pregnancy prior to [redacted] weeks gestation   2. Pain of round ligament complicating pregnancy, antepartum      Medication List  promethazine 12.5 MG tablet  Commonly known as:  PHENERGAN  Take 1 tablet (12.5 mg total) by mouth every 6 (six) hours as needed for nausea or vomiting.       Follow-up Information   Schedule an appointment as soon as possible for a visit with [facility].   Specialty:  Obstetrics and Gynecology   Contact information:   [address] [place] [address] [phone]       Tawnya Crook 06/23/2013, 11:16 PM

## 2013-06-27 NOTE — MAU Provider Note (Signed)
Attestation of Attending Supervision of Advanced Practitioner: Evaluation and management procedures were performed by the PA/NP/CNM/OB Fellow under my supervision/collaboration. Chart reviewed and agree with management and plan.  Sharmila Wrobleski V 06/27/2013 11:25 AM

## 2013-07-03 ENCOUNTER — Ambulatory Visit (INDEPENDENT_AMBULATORY_CARE_PROVIDER_SITE_OTHER): Payer: Medicaid Other | Admitting: Family

## 2013-07-03 ENCOUNTER — Encounter: Payer: Self-pay | Admitting: Family

## 2013-07-03 VITALS — BP 128/88 | Temp 98.6°F | Wt 188.1 lb

## 2013-07-03 DIAGNOSIS — O09299 Supervision of pregnancy with other poor reproductive or obstetric history, unspecified trimester: Secondary | ICD-10-CM

## 2013-07-03 DIAGNOSIS — O0990 Supervision of high risk pregnancy, unspecified, unspecified trimester: Secondary | ICD-10-CM | POA: Insufficient documentation

## 2013-07-03 DIAGNOSIS — Z124 Encounter for screening for malignant neoplasm of cervix: Secondary | ICD-10-CM

## 2013-07-03 DIAGNOSIS — Z113 Encounter for screening for infections with a predominantly sexual mode of transmission: Secondary | ICD-10-CM

## 2013-07-03 DIAGNOSIS — O0992 Supervision of high risk pregnancy, unspecified, second trimester: Secondary | ICD-10-CM

## 2013-07-03 DIAGNOSIS — O09292 Supervision of pregnancy with other poor reproductive or obstetric history, second trimester: Secondary | ICD-10-CM

## 2013-07-03 LAB — POCT URINALYSIS DIP (DEVICE)
Leukocytes, UA: NEGATIVE
Nitrite: NEGATIVE
Protein, ur: 30 mg/dL — AB
pH: 7 (ref 5.0–8.0)

## 2013-07-03 MED ORDER — CONCEPT DHA 53.5-38-1 MG PO CAPS: 1.0000 | ORAL_CAPSULE | Freq: Every day | ORAL | Status: DC

## 2013-07-03 MED ORDER — PROGESTERONE 200 MG VA SUPP: 200.0000 mg | Freq: Every day | VAGINAL | Status: DC

## 2013-07-03 NOTE — Progress Notes (Signed)
Subjective:    Jessica Walton is a [id number] [redacted]w[redacted]d being seen today for her first obstetrical visit.  Her obstetrical history is significant for history of shortened cervix at 24 wks with delivery at 37 wk. . Patient does not intend to breast feed. Pregnancy history fully reviewed.  Patient reports nausea, no bleeding, no contractions and pelvic pressure intermittently.Ceasar Mons Vitals:   07/03/13 0828  BP: 128/88  Temp: 98.6 F (37 C)  Weight: 188 lb 1.6 oz (85.322 kg)    HISTORY: OB History  Gravida Para Term Preterm AB SAB TAB Ectopic Multiple Living  4 1 1  0 2 1 1  0 0 1    # Outcome Date GA Lbr Len/2nd Weight Sex Delivery Anes PTL Lv  4 CUR           3 TRM 05/03/07 [redacted]w[redacted]d  7 lb 5 oz (3.317 kg) M SVD EPI  Y     Comments: Short cervix at 24 wks 1.9; received BMZ, progesterone  2 SAB 2007          1 TAB 2005             Past Medical History  Diagnosis Date  . Asthma     No inhaler use x 1 yr (2013)   Past Surgical History  Procedure Laterality Date  . Eye surgery     Family History  Problem Relation Age of Onset  . Hypertension Maternal Grandmother   . Diabetes Maternal Grandmother      Exam    Exam   BP 128/88  Temp(Src) 98.6 F (37 C)  Wt 188 lb 1.6 oz (85.322 kg)  LMP 02/26/2013 Uterine Size: size equals dates  Pelvic Exam:    Perineum: No Hemorrhoids, Normal Perineum   Vulva: normal   Vagina:  normal mucosa, normal discharge, no palpable nodules   pH: Not done   Cervix: no bleeding following Pap, no cervical motion tenderness and no lesions.   Adnexa: normal adnexa and no mass, fullness, tenderness   Bony Pelvis: Adequate  System: Breast:  No nipple retraction or dimpling, No nipple discharge or bleeding, No axillary or supraclavicular adenopathy, Normal to palpation without dominant masses   Skin: normal coloration and turgor, no rashes    Neurologic: negative   Extremities: normal strength, tone, and muscle mass   HEENT neck supple with  midline trachea and thyroid without masses   Mouth/Teeth mucous membranes moist, pharynx normal without lesions   Neck supple and no masses   Cardiovascular: regular rate and rhythm, no murmurs or gallops   Respiratory:  appears well, vitals normal, no respiratory distress, acyanotic, normal RR, neck free of mass or lymphadenopathy, chest clear, no wheezing, crepitations, rhonchi, normal symmetric air entry   Abdomen: soft, non-tender; bowel sounds normal; no masses,  no organomegaly. Keloid noted on abdomen.     Urinary: urethral meatus normal      Assessment:    Pregnancy: [id number] Patient Active Problem List   Diagnosis Date Noted  . Supervision of high-risk pregnancy 07/03/2013  . History of prior pregnancy with short cervix, currently pregnant in second trimester 07/03/2013        Plan:     Initial labs drawn. Quad obtained. Prenatal vitamins ordered. Problem list reviewed and updated. Genetic Screening discussed Quad Screen: ordered.  Ultrasound discussed; fetal survey: ordered. Begin progesterone 200 HS vaginally per Dr. Jolayne Panther - Pt left before getting instructions for progesterone use, all phone numbers listed called, all disconnected.  Follow up in 2 weeks.   [facility] 07/03/2013

## 2013-07-03 NOTE — Progress Notes (Signed)
Pulse- 108 Patient reports occasional pelvic pressure

## 2013-07-04 LAB — HIV ANTIBODY (ROUTINE TESTING W REFLEX): HIV: NONREACTIVE

## 2013-07-04 LAB — OBSTETRIC PANEL
Antibody Screen: NEGATIVE
Basophils Relative: 0 % (ref 0–1)
Eosinophils Absolute: 0.1 10*3/uL (ref 0.0–0.7)
Eosinophils Relative: 1 % (ref 0–5)
HCT: 34.2 % — ABNORMAL LOW (ref 36.0–46.0)
Hemoglobin: 11.7 g/dL — ABNORMAL LOW (ref 12.0–15.0)
MCH: 29.2 pg (ref 26.0–34.0)
MCHC: 34.2 g/dL (ref 30.0–36.0)
MCV: 85.3 fL (ref 78.0–100.0)
Monocytes Absolute: 0.4 10*3/uL (ref 0.1–1.0)
Monocytes Relative: 5 % (ref 3–12)
Neutro Abs: 5.8 10*3/uL (ref 1.7–7.7)
Rh Type: POSITIVE

## 2013-07-05 LAB — PRESCRIPTION MONITORING PROFILE (19 PANEL)
Amphetamine/Meth: NEGATIVE ng/mL
Cannabinoid Scrn, Ur: NEGATIVE ng/mL
Carisoprodol, Urine: NEGATIVE ng/mL
Cocaine Metabolites: NEGATIVE ng/mL
Creatinine, Urine: 392.09 mg/dL (ref >20.0)
Fentanyl, Ur: NEGATIVE ng/mL
Methadone Screen, Urine: NEGATIVE ng/mL
Phencyclidine, Ur: NEGATIVE ng/mL

## 2013-07-05 LAB — OXYCODONE, URINE (LC/MS-MS)
Noroxycodone, Ur: >2500 ng/mL — AB
Oxymorphone: 752 ng/mL — AB

## 2013-07-05 LAB — OPIATES/OPIOIDS (LC/MS-MS)
Hydrocodone: NEGATIVE ng/mL
Morphine Urine: NEGATIVE ng/mL
Norhydrocodone, Ur: NEGATIVE ng/mL
Oxymorphone: 752 ng/mL — AB

## 2013-07-05 LAB — AFP, QUAD SCREEN
Curr Gest Age: 18.1 wks.days
INH: 256.5 pg/mL
MoM for AFP: 0.94
Trisomy 18 (Edward) Syndrome Interp.: 1:90700 {titer}
uE3 Value: 0.9 ng/mL

## 2013-07-05 LAB — HEMOGLOBINOPATHY EVALUATION: Hgb A2 Quant: 2.6 % (ref 2.2–3.2)

## 2013-07-08 LAB — CULTURE, OB URINE

## 2013-07-09 ENCOUNTER — Other Ambulatory Visit: Payer: Self-pay | Admitting: Family

## 2013-07-09 NOTE — Progress Notes (Signed)
Pt phone number no longer disconnected > instructed patient on progesterone being sent to pharmacy > pt will pick up from pharmacy today.    Reviewed urine OB culture results with Dr. Mendel Ryder > pt +ENTEROBACTER AEROGENES and ENTEROCOCCUS SPECIES > 100K (collectively), pt asymptomatic > called [facility] to obtain bacteria amount for each species, unable to determine if either was a contaminant.  The only medication that would cover both is Levaquin > obtain another urine culture at next visit with adequate instruction on collection technique.

## 2013-07-11 ENCOUNTER — Ambulatory Visit (HOSPITAL_COMMUNITY)
Admission: RE | Admit: 2013-07-11 | Discharge: 2013-07-11 | Disposition: A | Discharge status: Home / Self Care | Payer: Medicaid Other | Source: Ambulatory Visit | Attending: Family | Admitting: Family

## 2013-07-11 DIAGNOSIS — Z3689 Encounter for other specified antenatal screening: Secondary | ICD-10-CM | POA: Insufficient documentation

## 2013-07-11 DIAGNOSIS — O09299 Supervision of pregnancy with other poor reproductive or obstetric history, unspecified trimester: Secondary | ICD-10-CM | POA: Insufficient documentation

## 2013-07-11 DIAGNOSIS — O0992 Supervision of high risk pregnancy, unspecified, second trimester: Secondary | ICD-10-CM

## 2013-07-11 DIAGNOSIS — O99330 Smoking (tobacco) complicating pregnancy, unspecified trimester: Secondary | ICD-10-CM | POA: Insufficient documentation

## 2013-07-11 DIAGNOSIS — O09292 Supervision of pregnancy with other poor reproductive or obstetric history, second trimester: Secondary | ICD-10-CM

## 2013-07-14 ENCOUNTER — Encounter: Payer: Self-pay | Admitting: Family

## 2013-07-25 DIAGNOSIS — Z9151 Personal history of suicidal behavior: Secondary | ICD-10-CM

## 2013-07-25 DIAGNOSIS — Z915 Personal history of self-harm: Secondary | ICD-10-CM

## 2013-07-25 HISTORY — DX: Personal history of suicidal behavior: Z91.51

## 2013-07-25 NOTE — L&D Delivery Note (Signed)
Delivery Note At 2:32 AM a premature, viable female was delivered via Vaginal, Spontaneous Delivery (Presentation: ; Occiput Anterior).  APGAR: 0,0; weight TBD.   Placenta status: delivered spontaneously intact.  Cord: 3V with the following complications: neonatal demise.  Cord pH: 7.34  Anesthesia: None  Episiotomy: None Lacerations: None Suture Repair: na Est. Blood Loss (mL): 350cc   Mom to postpartum.  Baby to [facility].   Pt transferred from antenatal at 0200 for bleeding and cervical dilation. Prior to transfer, doppler heart tones were done by myself in the presence of Cassie, RN and were 122. She was transferred to [facility]D.  At 0224, the nurse came out of the room stating she was having trouble finding a HR.  I took the US machine into the room and verified vertex and confirmed HR was down with an approximate rate of 70.  Dr. Erin FullingHarraway-Smith was called.  Cervix was rechecked and 8/100/+1.  NICU called. HR was rechecked and confirmed to still be low at 60 at 0228.  Dr. Erin FullingHarraway-Smith arrived and pt pushed with McRoberts to deliver at 0234.  Baby had poor tone and appeared to be around [redacted] weeks gestation. Cord was immediately cut and clamped and baby was taken to the awaiting NICU team.  Code Apgar was called.  Resuscitation was begun.  Cord pH was 7.34.  Placenta delivered intact with 3V cord with traction.  No evidence of abruption on close inspection.  No tears noted.  Resuscitation continued until time of death was called by Dr. Mikle Boswortharlos.   Mom to be transferred to GYN and baby taken to morgue    Jessica Walton 09/24/2013, 3:16 AM

## 2013-07-25 NOTE — L&D Delivery Note (Signed)
I was called to L&D as pt was transferred from ante.  Pt was noted to have bleeding.  FHR prior to transfer was 120's. Pt was noted on sono on L&D to have a fetal bradycardia.  Pt was noted to be 6cm initially.  Pt was able to push and delivery the fetus in a rapid manner with ~3 pushes.  The infant was handed off to the waiting NICU team.  A coe APGAR was called but, despite resuscitation the infant died.  The cord pH returned at 7.3.    I explained to the patient and her family that the fetus could not be ventilated.  The father of the baby expressed concern that if the baby had been delivered earlier that this would not have been the case.  I spent a considerable amount of time answering the family's questions.  The father of the baby's mother was also present. I left them in the care of the baby care nurse to explain the process for the infant.  They had no further questions of me.  Attestation of Attending Supervision of Fellow: Evaluation and management procedures were performed by the Fellow under my supervision and collaboration.  I have reviewed the Fellow's note and chart, and I agree with the management and plan.

## 2013-08-01 ENCOUNTER — Encounter: Payer: Self-pay | Admitting: Family

## 2013-09-14 ENCOUNTER — Encounter (HOSPITAL_COMMUNITY): Payer: Self-pay | Admitting: *Deleted

## 2013-09-14 ENCOUNTER — Inpatient Hospital Stay (HOSPITAL_COMMUNITY)
Admission: AD | Admit: 2013-09-14 | Discharge: 2013-09-24 | DRG: 775 | Disposition: A | Discharge status: Home / Self Care | Payer: Medicaid Other | Source: Ambulatory Visit | Attending: Obstetrics & Gynecology | Admitting: Obstetrics & Gynecology

## 2013-09-14 DIAGNOSIS — O26879 Cervical shortening, unspecified trimester: Secondary | ICD-10-CM | POA: Diagnosis present

## 2013-09-14 DIAGNOSIS — O42913 Preterm premature rupture of membranes, unspecified as to length of time between rupture and onset of labor, third trimester: Secondary | ICD-10-CM | POA: Diagnosis present

## 2013-09-14 DIAGNOSIS — O364XX0 Maternal care for intrauterine death, not applicable or unspecified: Secondary | ICD-10-CM | POA: Diagnosis present

## 2013-09-14 DIAGNOSIS — O99334 Smoking (tobacco) complicating childbirth: Secondary | ICD-10-CM | POA: Diagnosis present

## 2013-09-14 DIAGNOSIS — O3430 Maternal care for cervical incompetence, unspecified trimester: Secondary | ICD-10-CM | POA: Diagnosis present

## 2013-09-14 DIAGNOSIS — O0930 Supervision of pregnancy with insufficient antenatal care, unspecified trimester: Secondary | ICD-10-CM

## 2013-09-14 DIAGNOSIS — Z3490 Encounter for supervision of normal pregnancy, unspecified, unspecified trimester: Secondary | ICD-10-CM

## 2013-09-14 DIAGNOSIS — O4290 Premature rupture of membranes, unspecified as to length of time between rupture and onset of labor, unspecified weeks of gestation: Principal | ICD-10-CM | POA: Diagnosis present

## 2013-09-14 DIAGNOSIS — O09292 Supervision of pregnancy with other poor reproductive or obstetric history, second trimester: Secondary | ICD-10-CM

## 2013-09-14 DIAGNOSIS — O42919 Preterm premature rupture of membranes, unspecified as to length of time between rupture and onset of labor, unspecified trimester: Secondary | ICD-10-CM

## 2013-09-14 LAB — RAPID URINE DRUG SCREEN, HOSP PERFORMED
AMPHETAMINES: NOT DETECTED
Barbiturates: NOT DETECTED
Benzodiazepines: NOT DETECTED
COCAINE: NOT DETECTED
OPIATES: NOT DETECTED
Tetrahydrocannabinol: NOT DETECTED

## 2013-09-14 LAB — CBC
HEMATOCRIT: 31.6 % — AB (ref 36.0–46.0)
Hemoglobin: 10.7 g/dL — ABNORMAL LOW (ref 12.0–15.0)
MCH: 29.2 pg (ref 26.0–34.0)
MCHC: 33.9 g/dL (ref 30.0–36.0)
MCV: 86.3 fL (ref 78.0–100.0)
PLATELETS: 273 10*3/uL (ref 150–400)
RBC: 3.66 MIL/uL — ABNORMAL LOW (ref 3.87–5.11)
RDW: 14.6 % (ref 11.5–15.5)
WBC: 10.3 10*3/uL (ref 4.0–10.5)

## 2013-09-14 LAB — WET PREP, GENITAL
CLUE CELLS WET PREP: NONE SEEN
Trich, Wet Prep: NONE SEEN
Yeast Wet Prep HPF POC: NONE SEEN

## 2013-09-14 LAB — TYPE AND SCREEN
ABO/RH(D): A POS
Antibody Screen: NEGATIVE

## 2013-09-14 LAB — OB RESULTS CONSOLE GC/CHLAMYDIA
Chlamydia: NEGATIVE
GC PROBE AMP, GENITAL: NEGATIVE

## 2013-09-14 LAB — OB RESULTS CONSOLE GBS: GBS: POSITIVE

## 2013-09-14 LAB — GROUP B STREP BY PCR: GROUP B STREP BY PCR: POSITIVE — AB

## 2013-09-14 LAB — AMNISURE RUPTURE OF MEMBRANE (ROM) NOT AT ARMC: Amnisure ROM: POSITIVE

## 2013-09-14 MED ORDER — SODIUM CHLORIDE 0.9 % IV SOLN
2.0000 g | Freq: Four times a day (QID) | INTRAVENOUS | Status: AC
  Administered 2013-09-14 – 2013-09-16 (×8): 2 g via INTRAVENOUS
  Filled 2013-09-14 (×8): qty 2000

## 2013-09-14 MED ORDER — AMOXICILLIN 500 MG PO CAPS
500.0000 mg | ORAL_CAPSULE | Freq: Three times a day (TID) | ORAL | Status: AC
  Administered 2013-09-16 – 2013-09-21 (×15): 500 mg via ORAL
  Filled 2013-09-14 (×15): qty 1

## 2013-09-14 MED ORDER — ZOLPIDEM TARTRATE 5 MG PO TABS
5.0000 mg | ORAL_TABLET | Freq: Every evening | ORAL | Status: DC | PRN
  Administered 2013-09-16 – 2013-09-23 (×8): 5 mg via ORAL
  Filled 2013-09-14 (×10): qty 1

## 2013-09-14 MED ORDER — LACTATED RINGERS IV SOLN
INTRAVENOUS | Status: DC
  Administered 2013-09-15 – 2013-09-17 (×3): via INTRAVENOUS

## 2013-09-14 MED ORDER — MAGNESIUM SULFATE 40 G IN LACTATED RINGERS - SIMPLE
2.0000 g/h | INTRAVENOUS | Status: DC
  Filled 2013-09-14: qty 500

## 2013-09-14 MED ORDER — PRENATAL MULTIVITAMIN CH
1.0000 | ORAL_TABLET | Freq: Every day | ORAL | Status: DC
  Administered 2013-09-15 – 2013-09-23 (×9): 1 via ORAL
  Filled 2013-09-14 (×10): qty 1

## 2013-09-14 MED ORDER — DOCUSATE SODIUM 100 MG PO CAPS
100.0000 mg | ORAL_CAPSULE | Freq: Every day | ORAL | Status: DC
  Administered 2013-09-15 – 2013-09-23 (×3): 100 mg via ORAL
  Filled 2013-09-14 (×10): qty 1

## 2013-09-14 MED ORDER — ERYTHROMYCIN BASE 250 MG PO TABS
250.0000 mg | ORAL_TABLET | Freq: Four times a day (QID) | ORAL | Status: AC
  Administered 2013-09-16 – 2013-09-21 (×20): 250 mg via ORAL
  Filled 2013-09-14 (×20): qty 1

## 2013-09-14 MED ORDER — BETAMETHASONE SOD PHOS & ACET 6 (3-3) MG/ML IJ SUSP
12.0000 mg | Freq: Once | INTRAMUSCULAR | Status: AC
  Administered 2013-09-14: 12 mg via INTRAMUSCULAR
  Filled 2013-09-14: qty 2

## 2013-09-14 MED ORDER — ACETAMINOPHEN 325 MG PO TABS: 650.0000 mg | ORAL_TABLET | ORAL | Status: DC | PRN

## 2013-09-14 MED ORDER — MAGNESIUM SULFATE BOLUS VIA INFUSION
4.0000 g | Freq: Once | INTRAVENOUS | Status: AC
  Administered 2013-09-14: 4 g via INTRAVENOUS
  Filled 2013-09-14: qty 500

## 2013-09-14 MED ORDER — SODIUM CHLORIDE 0.9 % IV SOLN
250.0000 mg | Freq: Four times a day (QID) | INTRAVENOUS | Status: AC
  Administered 2013-09-14 – 2013-09-16 (×8): 250 mg via INTRAVENOUS
  Filled 2013-09-14 (×8): qty 5

## 2013-09-14 MED ORDER — CALCIUM CARBONATE ANTACID 500 MG PO CHEW
2.0000 | CHEWABLE_TABLET | ORAL | Status: DC | PRN
  Administered 2013-09-17 – 2013-09-22 (×5): 400 mg via ORAL
  Filled 2013-09-14: qty 2
  Filled 2013-09-14 (×6): qty 1

## 2013-09-14 MED ORDER — BETAMETHASONE SOD PHOS & ACET 6 (3-3) MG/ML IJ SUSP
12.0000 mg | INTRAMUSCULAR | Status: AC
  Administered 2013-09-15: 12 mg via INTRAMUSCULAR
  Filled 2013-09-14: qty 2

## 2013-09-14 MED ORDER — ALBUTEROL SULFATE (2.5 MG/3ML) 0.083% IN NEBU: 3.0000 mL | INHALATION_SOLUTION | Freq: Four times a day (QID) | RESPIRATORY_TRACT | Status: DC | PRN

## 2013-09-14 NOTE — H&P (Signed)
Attestation of Attending Supervision of Fellow: Evaluation and management procedures were performed by the Fellow under my supervision and collaboration.  I have reviewed the Fellow's note and chart, and I agree with the management and plan.    

## 2013-09-14 NOTE — H&P (Signed)
FACULTY PRACTICE ANTEPARTUM ADMISSION HISTORY AND PHYSICAL NOTE   History of Present Illness: Jessica Walton is a 28 y.o. [id number] at 4322w4d admitted for PROM.    Pt states that around 3pm she started leaking clear odorless fluid. Started as a gush and hasn't stopped since.  Now starting to contract but irregularly and not painful.  + FM. No VB.   Pt was seen for initial PNV on 12/10 and at that time, a discussion was had about PV progesterone due to a hx of incompetent cervix at 24 weeks but full term delivery but it was not started. When they attempted to get a hold of her they were unable to. She has not been back to clinic since and states this is because the return appointment they gave her was for the 19th of February. She missed this due to court.       Fetal presentation is cephalic.  Patient Active Problem List   Diagnosis Date Noted  . Preterm premature rupture of membranes (PPROM) delivered, current hospitalization 09/14/2013  . Supervision of high-risk pregnancy 07/03/2013  . History of prior pregnancy with short cervix, currently pregnant in second trimester 07/03/2013     Past Medical History  Diagnosis Date  . Asthma     No inhaler use x 1 yr (2013)     Past Surgical History  Procedure Laterality Date  . Eye surgery       OB History   Grav Para Term Preterm Abortions TAB SAB Ect Mult Living   4 1 1  0 2 1 1  0 0 1      History   Social History  . Marital Status: Single    Spouse Name: N/A    Number of Children: N/A  . Years of Education: N/A   Social History Main Topics  . Smoking status: Current Some Day Smoker -- 0.50 packs/day    Types: Cigarettes  . Smokeless tobacco: Never Used  . Alcohol Use: No  . Drug Use: No  . Sexual Activity: Yes   Other Topics Concern  . None   Social History Narrative  . None    Family History  Problem Relation Age of Onset  . Hypertension Maternal Grandmother   . Diabetes Maternal Grandmother     No Known  Allergies  Prescriptions prior to admission  Medication Sig Dispense Refill  . albuterol (PROVENTIL HFA;VENTOLIN HFA) 108 (90 BASE) MCG/ACT inhaler Inhale 2 puffs into the lungs every 6 (six) hours as needed (asthma).      . Prenat-FeFum-FePo-FA-Omega 3 (CONCEPT DHA) 53.5-38-1 MG CAPS Take 1 tablet by mouth daily.  30 capsule  2  . progesterone 200 MG SUPP Place 1 suppository (200 mg total) vaginally at bedtime.  30 each  1    . ampicillin (OMNIPEN) IV  2 g Intravenous Q6H   Followed by  . [START ON 09/16/2013] amoxicillin  500 mg Oral Q8H  . [START ON 09/15/2013] betamethasone acetate-betamethasone sodium phosphate  12 mg Intramuscular Q24H  . docusate sodium  100 mg Oral Daily  . erythromycin  250 mg Intravenous Q6H   Followed by  . [START ON 09/16/2013] erythromycin  250 mg Oral Q6H  . magnesium  4 g Intravenous Once  . [START ON 09/15/2013] prenatal multivitamin  1 tablet Oral Q1200   I have reviewed the patient's current medications.  Review of Systems - Negative except as stated in HPI  Vitals:  BP 136/88  Pulse 85  Temp(Src) 98.5 F (36.9  C)  Resp 18  Ht 5\' 9"  (1.753 m)  Wt 88.962 kg (196 lb 2 oz)  BMI 28.95 kg/m2  LMP 02/26/2013 Physical Examination:  General appearance - alert, well appearing, and in no distress and appears frustrated Chest - clear to auscultation, no wheezes, rales or rhonchi, symmetric air entry Heart - normal rate, regular rhythm, normal S1, S2, no murmurs, rubs, clicks or gallops Abdomen: gravid and fundal height  is size equals dates Pelvic Exam:NEFG, large copius pool in the vault.  Cervix visually closed and long.  +FERN  Extremities: extremities normal, atraumatic, no cyanosis or edema with DTRs 2+ bilaterally Membranes:ruptured Fetal Monitoring:Baseline: 140 bpm, Variability: Good {> 6 bpm), Accelerations: Reactive and Decelerations: Absent TOCO: irritability Labs:  No results found for this or any previous visit (from the past 24  hour(s)).  Imaging Studies: No results found.   Assessment and Plan: Patient Active Problem List   Diagnosis Date Noted  . Preterm premature rupture of membranes (PPROM) delivered, current hospitalization 09/14/2013  . Supervision of high-risk pregnancy 07/03/2013  . History of prior pregnancy with short cervix, currently pregnant in second trimester 07/03/2013   1) admit to antenatal - routine admission orders - CEFM/TOCO  2) PPROM - amnisure and fern and pool positive.  - latency abx with amp/erythro - BMZ x 1 in MAU and x2 tomorrow - mag for CP ppx  3) FWB - cat I tracing - VTX confirmed on US - GBS pcr collected  - NICU consult    Rulon Abide, MD OB fellow Faculty Practice, [facility]

## 2013-09-14 NOTE — Progress Notes (Signed)
Dr. Eric FormWimmer notified that pt needs a neo consult.

## 2013-09-14 NOTE — Consult Note (Signed)
Asked by Dr. Erin FullingHarraway-Smith to provide prenatal consultation for patient at risk for preterm delivery due to PROM.  Mother is 28 y.o. G4 P1-0-2-1 who is now 3328 4/[redacted] weeks EGA who noted leaking fluid about 3pm today; also irregular contractions.  Pregnancy complicated by late prenatal care, also had shortened cervix and threatened preterm delivery with previous pregnancy but eventually carried to term.  She is being treated with betamethasone (2nd dose planned for tomorrow afternoon), ampicillin, erythromycin, and MgSO4 for fetal neuroprotection.  GBS PCR is positive but there are no signs of infection and UC's have stopped since admission.  Discussed with patient and FOB usual expectations for preterm infant at 4528 -[redacted] weeks gestation, including possible needs for DR resuscitation, respiratory support, IV access, and blood products.  Also presented risks of death or serious morbidity, but informed them that most infants at this EGA survive without major handicap. Projected possible length of stay in NICU until 36 - [redacted] wks EGA.  Discussed advantages of feeding with mother's milk, and encouraged her to consider pumping postnatally.  Patient was attentive, had appropriate questions, and was appreciative of my input.  Thank you for the consultation.  Total time 20 minutes, face-to-face 15 minutes  Treyvon Blahut E. Barrie DunkerWimmer, Jr., MD

## 2013-09-14 NOTE — MAU Note (Signed)
Pt states about 30 minutes ago was voiding and had big gush of clear fluid. States is still coming out now. Hx PTL, PTD at 36 weeks with her son. No issues with pregnancy thus far.

## 2013-09-15 ENCOUNTER — Inpatient Hospital Stay (HOSPITAL_COMMUNITY): Payer: Medicaid Other

## 2013-09-15 DIAGNOSIS — O4290 Premature rupture of membranes, unspecified as to length of time between rupture and onset of labor, unspecified weeks of gestation: Secondary | ICD-10-CM

## 2013-09-15 LAB — CBC
HCT: 32.1 % — ABNORMAL LOW (ref 36.0–46.0)
HEMOGLOBIN: 11.1 g/dL — AB (ref 12.0–15.0)
MCH: 29.6 pg (ref 26.0–34.0)
MCHC: 34.6 g/dL (ref 30.0–36.0)
MCV: 85.6 fL (ref 78.0–100.0)
Platelets: 292 10*3/uL (ref 150–400)
RBC: 3.75 MIL/uL — ABNORMAL LOW (ref 3.87–5.11)
RDW: 14.9 % (ref 11.5–15.5)
WBC: 11.7 10*3/uL — ABNORMAL HIGH (ref 4.0–10.5)

## 2013-09-15 NOTE — Progress Notes (Signed)
Patient ID: KOLLYNS MICKELSON, female   DOB: 1986-07-13, 28 y.o.   MRN: [id number]  FACULTY PRACTICE ANTEPARTUM(COMPREHENSIVE) NOTE  CHLOEE TENA is a 28 y.o. G4P1021 at [redacted]w[redacted]d by early ultrasound who is admitted for PPROM.   Fetal presentation is cephalic by bedside US on 09/14/2013 Length of Stay:  1  Days  Subjective: Pt has no complaints today, went to restroom at toilet Patient reports the fetal movement as active. Patient reports uterine contraction  activity as irregular, Patient reports  vaginal bleeding as none. Patient describes fluid per vagina as Other clear and minimal.  Vitals:  Blood pressure 120/68, pulse 83, temperature 98.3 F (36.8 C), temperature source Oral, resp. rate 14, height 5\' 9"  (1.753 m), weight 86.183 kg (190 lb), last menstrual period 02/26/2013, SpO2 100.00%. Physical Examination:  General appearance - alert, well appearing, and in no distress and oriented to person, place, and time Heart - normal rate and regular rhythm Abdomen - soft, nontender, nondistended Fundal Height:  size equals dates Cervical Exam: not examined. Extremities: extremities normal, atraumatic, no cyanosis or edema and Homans sign is negative, no sign of DVT with DTRs 2+ bilaterally Membranes:ruptured, clear fluid  Fetal Monitoring:  Baseline: 120s bpm, Variability: Good {> 6 bpm), Accelerations: Reactive and Decelerations: Absent  Labs:  Results for orders placed during the hospital encounter of 09/14/13 (from the past 24 hour(s))  URINE RAPID DRUG SCREEN (HOSP PERFORMED)   Collection Time    09/14/13  3:44 PM      Result Value Ref Range   Opiates NONE DETECTED  NONE DETECTED   Cocaine NONE DETECTED  NONE DETECTED   Benzodiazepines NONE DETECTED  NONE DETECTED   Amphetamines NONE DETECTED  NONE DETECTED   Tetrahydrocannabinol NONE DETECTED  NONE DETECTED   Barbiturates NONE DETECTED  NONE DETECTED  WET PREP, GENITAL   Collection Time    09/14/13  4:40 PM      Result Value  Ref Range   Yeast Wet Prep HPF POC NONE SEEN  NONE SEEN   Trich, Wet Prep NONE SEEN  NONE SEEN   Clue Cells Wet Prep HPF POC NONE SEEN  NONE SEEN   WBC, Wet Prep HPF POC FEW (*) NONE SEEN  GROUP B STREP BY PCR   Collection Time    09/14/13  5:05 PM      Result Value Ref Range   Group B strep by PCR POSITIVE (*) NEGATIVE  AMNISURE RUPTURE OF MEMBRANE (ROM)   Collection Time    09/14/13  5:05 PM      Result Value Ref Range   Amnisure ROM POSITIVE    CBC   Collection Time    09/14/13  5:38 PM      Result Value Ref Range   WBC 10.3  4.0 - 10.5 K/uL   RBC 3.66 (*) 3.87 - 5.11 MIL/uL   Hemoglobin 10.7 (*) 12.0 - 15.0 g/dL   HCT 19.1 (*) 47.8 - 29.5 %   MCV 86.3  78.0 - 100.0 fL   MCH 29.2  26.0 - 34.0 pg   MCHC 33.9  30.0 - 36.0 g/dL   RDW 62.1  30.8 - 65.7 %   Platelets 273  150 - 400 K/uL  TYPE AND SCREEN   Collection Time    09/14/13  5:38 PM      Result Value Ref Range   ABO/RH(D) A POS     Antibody Screen NEG     Sample Expiration 09/17/2013  CBC   Collection Time    09/15/13  5:05 AM      Result Value Ref Range   WBC 11.7 (*) 4.0 - 10.5 K/uL   RBC 3.75 (*) 3.87 - 5.11 MIL/uL   Hemoglobin 11.1 (*) 12.0 - 15.0 g/dL   HCT 16.132.1 (*) 09.636.0 - 04.546.0 %   MCV 85.6  78.0 - 100.0 fL   MCH 29.6  26.0 - 34.0 pg   MCHC 34.6  30.0 - 36.0 g/dL   RDW 40.914.9  81.111.5 - 91.415.5 %   Platelets 292  150 - 400 K/uL    Imaging Studies:    Medications:  Scheduled . ampicillin (OMNIPEN) IV  2 g Intravenous Q6H   Followed by  . [START ON 09/16/2013] amoxicillin  500 mg Oral Q8H  . betamethasone acetate-betamethasone sodium phosphate  12 mg Intramuscular Q24H  . docusate sodium  100 mg Oral Daily  . erythromycin  250 mg Intravenous Q6H   Followed by  . [START ON 09/16/2013] erythromycin  250 mg Oral Q6H  . prenatal multivitamin  1 tablet Oral Q1200   I have reviewed the patient's current medications.  ASSESSMENT: Beryle Quantrlie E Donofrio is a 28 y.o. [id number] at 6051w5d by early ultrasound who is  admitted for PPROM.    Patient Active Problem List   Diagnosis Date Noted  . Preterm premature rupture of membranes (PPROM) delivered, current hospitalization 09/14/2013  . Supervision of high-risk pregnancy 07/03/2013  . History of prior pregnancy with short cervix, currently pregnant in second trimester 07/03/2013    PLAN: Beryle Quantrlie E Lipke is a 28 y.o. G4P1021 at 5951w5d by L=6 here for PPROM 1) PPROM  - latency abx with amp/erythro  - BMZ x 1 in MAU and x2 today - mag for CP ppx   2) FWB  - cat I tracing  - VTX confirmed on US on 09/14/2013 - GBS Neg - NICU consult  FEN: SLIV/replete PRN/regular diet  PPx: SVDs  Valena Ivanov RYAN 09/15/2013,7:59 AM

## 2013-09-15 NOTE — Plan of Care (Signed)
Problem: Consults Goal: Birthing Suites Patient Information Press F2 to bring up selections list   Pt < [redacted] weeks EGA     

## 2013-09-16 LAB — CBC
HCT: 28.3 % — ABNORMAL LOW (ref 36.0–46.0)
Hemoglobin: 9.7 g/dL — ABNORMAL LOW (ref 12.0–15.0)
MCH: 29.4 pg (ref 26.0–34.0)
MCHC: 34.3 g/dL (ref 30.0–36.0)
MCV: 85.8 fL (ref 78.0–100.0)
Platelets: 270 10*3/uL (ref 150–400)
RBC: 3.3 MIL/uL — ABNORMAL LOW (ref 3.87–5.11)
RDW: 14.9 % (ref 11.5–15.5)
WBC: 17.8 10*3/uL — ABNORMAL HIGH (ref 4.0–10.5)

## 2013-09-16 LAB — GC/CHLAMYDIA PROBE AMP
CT Probe RNA: NEGATIVE
GC PROBE AMP APTIMA: NEGATIVE

## 2013-09-16 NOTE — Progress Notes (Signed)
Reactive NST 

## 2013-09-16 NOTE — Progress Notes (Addendum)
Patient ID: Jessica Walton, female   DOB: [birth year], 28 y.o.   MRN: [id number] FACULTY PRACTICE ANTEPARTUM(COMPREHENSIVE) NOTE  Jessica Walton is a 28 y.o. G4P1021 at 5271w6d  who is admitted for PPROM.   Length of Stay:  2  Days  Subjective: Patient reports the fetal movement as active. Patient reports uterine contraction  activity as none. Patient reports  vaginal bleeding as none. Patient describes fluid per vagina as Clear.  Vitals:  Blood pressure 138/79, pulse 90, temperature 98.2 F (36.8 C), temperature source Oral, resp. rate 18, height 5\' 9"  (1.753 m), weight 190 lb (86.183 kg), last menstrual period 02/26/2013, SpO2 100.00%. Physical Examination:  General appearance - alert, well appearing, and in no distress Abdomen - soft, NT, gravid Extremities - Homan's sign negative bilaterally  Fetal Monitoring:  Baseline 130, moderate variability, no decelerations, accelerations present.  Labs:  Results for orders placed during the hospital encounter of 09/14/13 (from the past 24 hour(s))  CBC   Collection Time    09/16/13  5:05 AM      Result Value Ref Range   WBC 17.8 (*) 4.0 - 10.5 K/uL   RBC 3.30 (*) 3.87 - 5.11 MIL/uL   Hemoglobin 9.7 (*) 12.0 - 15.0 g/dL   HCT 78.228.3 (*) 95.636.0 - 21.346.0 %   MCV 85.8  78.0 - 100.0 fL   MCH 29.4  26.0 - 34.0 pg   MCHC 34.3  30.0 - 36.0 g/dL   RDW 08.614.9  57.811.5 - 46.915.5 %   Platelets 270  150 - 400 K/uL   Medications:  Scheduled . ampicillin (OMNIPEN) IV  2 g Intravenous Q6H   Followed by  . amoxicillin  500 mg Oral Q8H  . docusate sodium  100 mg Oral Daily  . erythromycin  250 mg Intravenous Q6H   Followed by  . erythromycin  250 mg Oral Q6H  . prenatal multivitamin  1 tablet Oral Q1200   I have reviewed the patient's current medications.  ASSESSMENT: Patient Active Problem List   Diagnosis Date Noted  . Preterm premature rupture of membranes (PPROM) delivered, current hospitalization 09/14/2013  . Supervision of high-risk pregnancy  07/03/2013  . History of prior pregnancy with short cervix, currently pregnant in second trimester 07/03/2013    PLAN: Jessica Walton is a 28 y.o. G2X5284G4P1021 at 9771w6d  who is admitted for PPROM.   1-Pt s/p magnesium  2-Pt received 2 doses of betamethasone 3-Continue latency abx 4-Continue to watch for signs of chorio 5-Pt needs NICU tour.  Jessica Walton H. 09/16/2013,6:24 AM

## 2013-09-16 NOTE — Progress Notes (Signed)
Patient still has bed elevated.  She refuses at this time to lower it.

## 2013-09-16 NOTE — Progress Notes (Signed)
Ur chart review completed.  

## 2013-09-16 NOTE — Progress Notes (Signed)
Pt has bed up in air in high position when asked why she said she feels better   Enc pt not to keep it in high position because of risk of falling  Pt stated she understood

## 2013-09-16 NOTE — Progress Notes (Signed)
Patient is back from her wheelchair ride.

## 2013-09-17 LAB — TYPE AND SCREEN
ABO/RH(D): A POS
ANTIBODY SCREEN: NEGATIVE

## 2013-09-17 LAB — CBC
HCT: 27.2 % — ABNORMAL LOW (ref 36.0–46.0)
Hemoglobin: 9.3 g/dL — ABNORMAL LOW (ref 12.0–15.0)
MCH: 29.4 pg (ref 26.0–34.0)
MCHC: 34.2 g/dL (ref 30.0–36.0)
MCV: 86.1 fL (ref 78.0–100.0)
PLATELETS: 240 10*3/uL (ref 150–400)
RBC: 3.16 MIL/uL — ABNORMAL LOW (ref 3.87–5.11)
RDW: 15 % (ref 11.5–15.5)
WBC: 15.8 10*3/uL — AB (ref 4.0–10.5)

## 2013-09-17 MED ORDER — SODIUM CHLORIDE 0.9 % IJ SOLN
3.0000 mL | Freq: Two times a day (BID) | INTRAMUSCULAR | Status: DC
  Administered 2013-09-17 – 2013-09-19 (×5): 3 mL via INTRAVENOUS

## 2013-09-17 NOTE — Progress Notes (Signed)
Patient ID: Jessica Walton, female   DOB: 27-Feb-1986, 27 y.o.   MRN: [id number] FACULTY PRACTICE ANTEPARTUM(COMPREHENSIVE) NOTE  Jessica Walton is a 28 y.o. G4P1021 at 2651w0d  who is admitted for PPROM.   Length of Stay:  3  Days  Subjective: Patient reports the fetal movement as active. Patient reports uterine contraction  activity as none. Patient reports  vaginal bleeding as none. Patient describes fluid per vagina as Clear.  Vitals:  Blood pressure 130/69, pulse 87, temperature 97.5 F (36.4 C), temperature source Oral, resp. rate 18, height 5\' 9"  (1.753 m), weight 190 lb (86.183 kg), last menstrual period 02/26/2013, SpO2 100.00%. Physical Examination:  General appearance - alert, well appearing, and in no distress Abdomen - soft, NT, gravid Extremities - Homan's sign negative bilaterally  Fetal Monitoring:  Baseline 120, moderate variability, no decelerations, accelerations present. TOCO: no contractions  Labs:  Results for orders placed during the hospital encounter of 09/14/13 (from the past 24 hour(s))  CBC   Collection Time    09/17/13  5:10 AM      Result Value Ref Range   WBC 15.8 (*) 4.0 - 10.5 K/uL   RBC 3.16 (*) 3.87 - 5.11 MIL/uL   Hemoglobin 9.3 (*) 12.0 - 15.0 g/dL   HCT 11.927.2 (*) 14.736.0 - 82.946.0 %   MCV 86.1  78.0 - 100.0 fL   MCH 29.4  26.0 - 34.0 pg   MCHC 34.2  30.0 - 36.0 g/dL   RDW 56.215.0  13.011.5 - 86.515.5 %   Platelets 240  150 - 400 K/uL  TYPE AND SCREEN   Collection Time    09/17/13  5:10 AM      Result Value Ref Range   ABO/RH(D) A POS     Antibody Screen NEG     Sample Expiration 09/20/2013     Medications:  Scheduled . amoxicillin  500 mg Oral Q8H  . docusate sodium  100 mg Oral Daily  . erythromycin  250 mg Oral Q6H  . prenatal multivitamin  1 tablet Oral Q1200   I have reviewed the patient's current medications.  ASSESSMENT: Patient Active Problem List   Diagnosis Date Noted  . Preterm premature rupture of membranes (PPROM) delivered,  current hospitalization 09/14/2013  . Supervision of high-risk pregnancy 07/03/2013  . History of prior pregnancy with short cervix, currently pregnant in second trimester 07/03/2013    PLAN: Jessica Walton is a 28 y.o. [id number] at 8951w0d  who is admitted for PPROM, s/p BMZ and MgSO4  Continue latency abx Continue to monitor for signs/symptoms of chorio Continue antepartum care  Anistyn Graddy 09/17/2013,6:55 AM

## 2013-09-18 ENCOUNTER — Encounter: Payer: Self-pay | Admitting: *Deleted

## 2013-09-18 LAB — CBC
HCT: 29.7 % — ABNORMAL LOW (ref 36.0–46.0)
Hemoglobin: 10 g/dL — ABNORMAL LOW (ref 12.0–15.0)
MCH: 29.2 pg (ref 26.0–34.0)
MCHC: 33.7 g/dL (ref 30.0–36.0)
MCV: 86.6 fL (ref 78.0–100.0)
Platelets: 256 10*3/uL (ref 150–400)
RBC: 3.43 MIL/uL — ABNORMAL LOW (ref 3.87–5.11)
RDW: 15 % (ref 11.5–15.5)
WBC: 14.2 10*3/uL — ABNORMAL HIGH (ref 4.0–10.5)

## 2013-09-18 NOTE — Progress Notes (Signed)
Patient ID: Jessica Quantrlie E Keys, female   DOB: August 19, 1985, 28 y.o.   MRN: [id number] FACULTY PRACTICE ANTEPARTUM(COMPREHENSIVE) NOTE  Jessica Walton is a 28 y.o. G4P1021 at 5133w1d by best clinical estimate who is admitted for PROM.   Fetal presentation is cephalic. Length of Stay:  4  Days  Subjective: Mild tenderness, few contractions Patient reports the fetal movement as decreased . Patient reports uterine contraction  activity as irregular, every 10 minutes. Patient reports  vaginal bleeding as none. Patient describes fluid per vagina as None.  Vitals:  Blood pressure 110/65, pulse 81, temperature 99.1 F (37.3 C), temperature source Oral, resp. rate 18, height 5\' 9"  (1.753 m), weight 190 lb (86.183 kg), last menstrual period 02/26/2013, SpO2 100.00%. Physical Examination:  General appearance - alert, well appearing, and in no distress Abdomen - soft, nontender, nondistended, no masses or organomegaly Gravid, non-tender Fundal Height:  size equals dates Extremities: extremities normal, atraumatic, no cyanosis or edema with DTRs    Fetal Monitoring:  Baseline: 130 bpm, Variability: Good {> 6 bpm), Accelerations: Reactive and Decelerations: Absent  Labs:  Results for orders placed during the hospital encounter of 09/14/13 (from the past 24 hour(s))  CBC   Collection Time    09/18/13  5:34 AM      Result Value Ref Range   WBC 14.2 (*) 4.0 - 10.5 K/uL   RBC 3.43 (*) 3.87 - 5.11 MIL/uL   Hemoglobin 10.0 (*) 12.0 - 15.0 g/dL   HCT 24.429.7 (*) 01.036.0 - 27.246.0 %   MCV 86.6  78.0 - 100.0 fL   MCH 29.2  26.0 - 34.0 pg   MCHC 33.7  30.0 - 36.0 g/dL   RDW 53.615.0  64.411.5 - 03.415.5 %   Platelets 256  150 - 400 K/uL      Medications:  Scheduled . amoxicillin  500 mg Oral Q8H  . docusate sodium  100 mg Oral Daily  . erythromycin  250 mg Oral Q6H  . prenatal multivitamin  1 tablet Oral Q1200  . sodium chloride  3 mL Intravenous Q12H   I have reviewed the patient's current  medications.  ASSESSMENT: Patient Active Problem List   Diagnosis Date Noted  . Preterm premature rupture of membranes (PPROM) delivered, current hospitalization 09/14/2013  . Supervision of high-risk pregnancy 07/03/2013  . History of prior pregnancy with short cervix, currently pregnant in second trimester 07/03/2013    PLAN: Continue inpt. Monitoring and delivery with s/sx's chorio.  Sala Tague S 09/18/2013,6:56 AM

## 2013-09-19 LAB — CBC
HCT: 29.9 % — ABNORMAL LOW (ref 36.0–46.0)
Hemoglobin: 10.2 g/dL — ABNORMAL LOW (ref 12.0–15.0)
MCH: 29.4 pg (ref 26.0–34.0)
MCHC: 34.1 g/dL (ref 30.0–36.0)
MCV: 86.2 fL (ref 78.0–100.0)
PLATELETS: 259 10*3/uL (ref 150–400)
RBC: 3.47 MIL/uL — AB (ref 3.87–5.11)
RDW: 14.3 % (ref 11.5–15.5)
WBC: 13.9 10*3/uL — ABNORMAL HIGH (ref 4.0–10.5)

## 2013-09-19 NOTE — Progress Notes (Signed)
UR completed 

## 2013-09-19 NOTE — Progress Notes (Signed)
Pt left unit refusing to ride in w/c as requested by MD's for ride outside.  Luan MooreM Causey RN call the RN taking care of pt to notify her of pt's refusal to ride in w/c BF with pt and made aware of need to use chair

## 2013-09-19 NOTE — Progress Notes (Signed)
Antenatal Nutrition Assessment:  Currently  29 2/[redacted] weeks gestation, with PROM. Height  69 "  Weight 191 lbs  pre-pregnancy weight 194 lbs .  Pre-pregnancy  BMI 28.3  IBW 145 lbs Total weight gain 0.lbs  Pt reports that etiology of no weight gain is N/V early in pregnancy, which has resolved. Weight gain goals 15-25 lbs Estimated needs: 21-2200 kcal/day, 78-88 grams protein/day, 2.3 liters fluid/day  Regular diet tolerated well, appetite good.Provided snack menu and retail menu Current diet prescription will provide for increased needs.  No abnormal nutrition related labs  Nutrition Dx: Increased nutrient needs r/t pregnancy and fetal growth requirements aeb [redacted] weeks gestation.  No educational needs assessed at this time.  Elisabeth CaraKatherine Jamilah Jean M.Odis LusterEd. R.D. LDN Neonatal Nutrition Support Specialist Pager [phone]

## 2013-09-20 LAB — CBC
HEMATOCRIT: 30.3 % — AB (ref 36.0–46.0)
Hemoglobin: 10.2 g/dL — ABNORMAL LOW (ref 12.0–15.0)
MCH: 29 pg (ref 26.0–34.0)
MCHC: 33.7 g/dL (ref 30.0–36.0)
MCV: 86.1 fL (ref 78.0–100.0)
Platelets: 266 10*3/uL (ref 150–400)
RBC: 3.52 MIL/uL — ABNORMAL LOW (ref 3.87–5.11)
RDW: 14.8 % (ref 11.5–15.5)
WBC: 14.1 10*3/uL — AB (ref 4.0–10.5)

## 2013-09-20 MED ORDER — FAMOTIDINE 20 MG PO TABS
20.0000 mg | ORAL_TABLET | Freq: Two times a day (BID) | ORAL | Status: DC
  Administered 2013-09-20 – 2013-09-23 (×7): 20 mg via ORAL
  Filled 2013-09-20 (×7): qty 1

## 2013-09-20 NOTE — Progress Notes (Signed)
Patient ID: Jessica Walton, female   DOB: 08-08-1985, 28 y.o.   MRN: [id number] FACULTY PRACTICE ANTEPARTUM(COMPREHENSIVE) NOTE  Jessica Walton is a 28 y.o. G4P1021 at 4120w3d by LMP who is admitted for PROM.   Fetal presentation is cephalic. Length of Stay:  6  Days  Subjective: Occasional contractions Patient reports the fetal movement as active. Patient reports uterine contraction  activity as irregular. Patient reports  vaginal bleeding as none. Patient describes fluid per vagina as Clear.  Vitals:  Blood pressure 112/62, pulse 71, temperature 98.2 F (36.8 C), temperature source Oral, resp. rate 18, height 5\' 9"  (1.753 m), weight 191 lb 12.8 oz (87 kg), last menstrual period 02/26/2013, SpO2 100.00%. Physical Examination:  General appearance - alert, well appearing, and in no distress Heart - normal rate and regular rhythm Abdomen - soft, nontender, nondistended Fundal Height:  size equals dates Cervical Exam: Not evaluated. Extremities: extremities normal, atraumatic, no cyanosis or edema  Membranes:ruptured  Fetal Monitoring:  Baseline: 140 bpm, Variability: Good {> 6 bpm), Accelerations: Reactive and Decelerations: Absent  Labs:  Results for orders placed during the hospital encounter of 09/14/13 (from the past 24 hour(s))  CBC   Collection Time    09/20/13  5:15 AM      Result Value Ref Range   WBC 14.1 (*) 4.0 - 10.5 K/uL   RBC 3.52 (*) 3.87 - 5.11 MIL/uL   Hemoglobin 10.2 (*) 12.0 - 15.0 g/dL   HCT 40.930.3 (*) 81.136.0 - 91.446.0 %   MCV 86.1  78.0 - 100.0 fL   MCH 29.0  26.0 - 34.0 pg   MCHC 33.7  30.0 - 36.0 g/dL   RDW 78.214.8  95.611.5 - 21.315.5 %   Platelets 266  150 - 400 K/uL    Imaging Studies:      Currently EPIC will not allow sonographic studies to automatically populate into notes.  In the meantime, copy and paste results into note or free text.  Medications:  Scheduled . amoxicillin  500 mg Oral Q8H  . docusate sodium  100 mg Oral Daily  . erythromycin  250 mg Oral  Q6H  . prenatal multivitamin  1 tablet Oral Q1200   I have reviewed the patient's current medications.  ASSESSMENT: Patient Active Problem List   Diagnosis Date Noted  . Preterm premature rupture of membranes (PPROM) delivered, current hospitalization 09/14/2013  . Supervision of high-risk pregnancy 07/03/2013  . History of prior pregnancy with short cervix, currently pregnant in second trimester 07/03/2013    PLAN: Continue inpatient care  Damoni Causby 09/20/2013,8:43 AM

## 2013-09-21 NOTE — Progress Notes (Signed)
Patient ID: Jessica Walton, female   DOB: 07-17-1986, 28 y.o.   MRN: [id number] FACULTY PRACTICE ANTEPARTUM(COMPREHENSIVE) NOTE  Jessica Walton is a 28 y.o. G4P1021 at 423w4d by early ultrasound who is admitted for rupture of membranes.   Fetal presentation is vertex at admit Length of Stay:  7  Days  Subjective: No complaints Patient reports the fetal movement as active. Patient reports uterine contraction  activity as occasional mild contraction never more  Than 3/ hr. Patient reports  vaginal bleeding as none. Patient describes fluid per vagina as Clear.  Vitals:  Blood pressure 124/71, pulse 85, temperature 98.6 F (37 C), temperature source Oral, resp. rate 18, height 5\' 9"  (1.753 m), weight 191 lb 12.8 oz (87 kg), last menstrual period 02/26/2013, SpO2 100.00%. Physical Examination:  General appearance - alert, well appearing, and in no distress and well hydrated Heart - normal rate and regular rhythm Abdomen - soft, nontender, nondistended Fundal Height:  size equals dates nontender uterus Cervical Exam: Not evaluated. Extremities: extremities normal, atraumatic, no cyanosis or edema and Homans sign is negative, no sign of DVT with DTRs 2+ bilaterally Membranes:i ruptured  Fetal Monitoring:  daily, pending for this am.  Labs:  No results found for this or any previous visit (from the past 24 hour(s)).  Imaging Studies:     Currently EPIC will not allow sonographic studies to automatically populate into notes.  In the meantime, copy and paste results into note or free text.  Medications:  Scheduled . amoxicillin  500 mg Oral Q8H  . docusate sodium  100 mg Oral Daily  . erythromycin  250 mg Oral Q6H  . famotidine  20 mg Oral BID  . prenatal multivitamin  1 tablet Oral Q1200   I have reviewed the patient's current medications.  ASSESSMENT: Patient Active Problem List   Diagnosis Date Noted  . Preterm premature rupture of membranes (PPROM) delivered, current  hospitalization 09/14/2013  . Supervision of high-risk pregnancy 07/03/2013  . History of prior pregnancy with short cervix, currently pregnant in second trimester 07/03/2013    PLAN: inpt care til delivery.   Jessica Walton V 09/21/2013,8:01 AM

## 2013-09-22 NOTE — Progress Notes (Signed)
Patient ID: Jessica Walton, female   DOB: 18-Jan-1986, 28 y.o.   MRN: [id number] FACULTY PRACTICE ANTEPARTUM(COMPREHENSIVE) NOTE  Jessica Quantrlie E Behringer is a 28 y.o. G4P1021 at 870w5d by early ultrasound who is admitted for PROM.   Fetal presentation is cephalic. Length of Stay:  8  Days  Subjective: Stable, still with 6-8 pad/day Patient reports the fetal movement as active. Patient reports uterine contraction  activity as none. Patient reports  vaginal bleeding as none. Patient describes fluid per vagina as Clear.  Vitals:  Blood pressure 101/56, pulse 82, temperature 98.4 F (36.9 C), temperature source Oral, resp. rate 18, height 5\' 9"  (1.753 m), weight 87 kg (191 lb 12.8 oz), last menstrual period 02/26/2013, SpO2 100.00%. Physical Examination:  General appearance - alert, well appearing, and in no distress Heart - normal rate and regular rhythm Abdomen - soft, nontender, nondistended Fundal Height:  size equals dates Cervical Exam: Not evaluated.  Extremities: extremities normal, atraumatic, no cyanosis or edema and Homans sign is negative, no sign of DVT with DTRs 2+ bilaterally Membranes:ruptured  Fetal Monitoring:  pending this a.m.  Labs:  No results found for this or any previous visit (from the past 24 hour(s)).  Imaging Studies:     Currently EPIC will not allow sonographic studies to automatically populate into notes.  In the meantime, copy and paste results into note or free text.  Medications:  Scheduled . docusate sodium  100 mg Oral Daily  . famotidine  20 mg Oral BID  . prenatal multivitamin  1 tablet Oral Q1200   I have reviewed the patient's current medications.  ASSESSMENT: Patient Active Problem List   Diagnosis Date Noted  . Preterm premature rupture of membranes (PPROM) delivered, current hospitalization 09/14/2013  . Supervision of high-risk pregnancy 07/03/2013  . History of prior pregnancy with short cervix, currently pregnant in second trimester  07/03/2013    PLAN: Inpatient care till delivery, at 34 weeks or at time of labor, change in wellbeing of mother or fetus.  Zedekiah Hinderman V 09/22/2013,7:43 AM

## 2013-09-23 ENCOUNTER — Inpatient Hospital Stay (HOSPITAL_COMMUNITY): Payer: Medicaid Other

## 2013-09-23 LAB — CBC
HCT: 33.4 % — ABNORMAL LOW (ref 36.0–46.0)
HEMOGLOBIN: 11.4 g/dL — AB (ref 12.0–15.0)
MCH: 29.3 pg (ref 26.0–34.0)
MCHC: 34.1 g/dL (ref 30.0–36.0)
MCV: 85.9 fL (ref 78.0–100.0)
PLATELETS: 291 10*3/uL (ref 150–400)
RBC: 3.89 MIL/uL (ref 3.87–5.11)
RDW: 14.8 % (ref 11.5–15.5)
WBC: 13.8 10*3/uL — AB (ref 4.0–10.5)

## 2013-09-23 MED ORDER — OXYCODONE-ACETAMINOPHEN 5-325 MG PO TABS
2.0000 | ORAL_TABLET | ORAL | Status: DC | PRN
  Filled 2013-09-23: qty 2

## 2013-09-23 MED ORDER — OXYCODONE-ACETAMINOPHEN 5-325 MG PO TABS
1.0000 | ORAL_TABLET | Freq: Four times a day (QID) | ORAL | Status: DC | PRN
  Administered 2013-09-23: 2 via ORAL

## 2013-09-23 MED ORDER — ZOLPIDEM TARTRATE 5 MG PO TABS
10.0000 mg | ORAL_TABLET | Freq: Once | ORAL | Status: AC
  Administered 2013-09-23: 10 mg via ORAL

## 2013-09-23 MED ORDER — SODIUM CHLORIDE 0.9 % IV SOLN: Freq: Once | INTRAVENOUS | Status: DC

## 2013-09-23 MED ORDER — LACTATED RINGERS IV SOLN: INTRAVENOUS | Status: DC

## 2013-09-23 MED ORDER — LACTATED RINGERS IV BOLUS (SEPSIS): 1000.0000 mL | Freq: Once | INTRAVENOUS | Status: DC

## 2013-09-23 MED ORDER — SODIUM CHLORIDE 0.9 % IV SOLN: INTRAVENOUS | Status: DC

## 2013-09-23 NOTE — Progress Notes (Signed)
Pt off the monitor after reassurring FHR  

## 2013-09-23 NOTE — Progress Notes (Signed)
Patient ID: Jessica Walton, female   DOB: 1985-09-07, 28 y.o.   MRN: [id number] FACULTY PRACTICE ANTEPARTUM(COMPREHENSIVE) NOTE  Jessica Quantrlie E Kaczorowski is a 28 y.o. G4P1021 at 6677w6d by early ultrasound who is admitted for PPROM.   Fetal presentation is cephalic. Length of Stay:  9  Days  Subjective: Stable, still leaking clear fluid occasionally Patient reports the fetal movement as active. Patient reports uterine contraction  activity as none. Patient reports  vaginal bleeding as none. Patient describes fluid per vagina as Clear.  Vitals:  Blood pressure 142/91, pulse 104, temperature 98.7 F (37.1 C), temperature source Oral, resp. rate 18, height 5\' 9"  (1.753 m), weight 191 lb 12.8 oz (87 kg), last menstrual period 02/26/2013, SpO2 100.00%. Physical Examination:  General appearance - alert, well appearing, and in no distress Heart - normal rate and regular rhythm Abdomen - soft, nontender, nondistended Fundal Height:  size equals dates Cervical Exam: Not evaluated.  Extremities: extremities normal, atraumatic, no cyanosis or edema and Homans sign is negative, no sign of DVT with DTRs 2+ bilaterally Membranes:ruptured  Fetal Monitoring:  Baseline: 140 bpm, Variability: Moderate, Accelerations: Reactive and Decelerations: rare small variable decels  Labs:  Results for orders placed during the hospital encounter of 09/14/13 (from the past 24 hour(s))  CBC   Collection Time    09/23/13  5:20 AM      Result Value Ref Range   WBC 13.8 (*) 4.0 - 10.5 K/uL   RBC 3.89  3.87 - 5.11 MIL/uL   Hemoglobin 11.4 (*) 12.0 - 15.0 g/dL   HCT 40.933.4 (*) 81.136.0 - 91.446.0 %   MCV 85.9  78.0 - 100.0 fL   MCH 29.3  26.0 - 34.0 pg   MCHC 34.1  30.0 - 36.0 g/dL   RDW 78.214.8  95.611.5 - 21.315.5 %   Platelets 291  150 - 400 K/uL    Imaging Studies:    09/15/13  EFW 1298 g (2 lb 14oz)/54%, AFI 1.86 cm, cephalic, anterior placenta  Medications:  Scheduled . docusate sodium  100 mg Oral Daily  . famotidine  20 mg Oral  BID  . prenatal multivitamin  1 tablet Oral Q1200   I have reviewed the patient's current medications.  ASSESSMENT: Patient Active Problem List   Diagnosis Date Noted  . Preterm premature rupture of membranes (PPROM) delivered, current hospitalization 09/14/2013  . Supervision of high-risk pregnancy 07/03/2013  . History of prior pregnancy with short cervix, currently pregnant in second trimester 07/03/2013    PLAN: Inpatient care till delivery, at 34 weeks or at time of labor, change in wellbeing of mother or fetus. Will schedule weekly AFI today Routine antenatal care  Tereso NewcomerUgonna A Arieal Cuoco, MD 09/23/2013,7:15 AM

## 2013-09-23 NOTE — Progress Notes (Signed)
Patient ID: Jessica Quantrlie E Puzio, female   DOB: [birth year], 28 y.o.   MRN: [id number] ACULTY PRACTICE ANTEPARTUM COMPREHENSIVE PROGRESS NOTE  Jessica Walton is a 28 y.o. G4P1021 at 5967w6d  who is admitted for PROM.   Fetal presentation is cephalic. Length of Stay:  9  Days  Subjective: Called to see pt for c/o pain and 'I know this baby is coming', 'I know what it feels like to have a baby'.  Pt insisting on having her cervix checked.  She reports ctx q 1 1/2 min.  Reports increased vaginal pressure.  She denis vaginal bleeding.  She reports 'y'all better give me my medicine or I'm living' , 'you guys are doing anything for me'; pt was cursing.  i attempted to explain the reason for her admission and how it benefited the baby.  I also explained to her that I would not allow her to curse or make demands. I explained to her that we are not forcing her to stay but, recommend it for the health of her baby.  The FOB was present and appeared to express understanding.  His questions were answered.   I explained to them why we did not recommend an examination.     Vitals:  Blood pressure 135/81, pulse 110, temperature 98.7 F (37.1 C), temperature source Oral, resp. rate 18, height 5\' 9"  (1.753 m), weight 191 lb 12.8 oz (87 kg), last menstrual period 02/26/2013, SpO2 100.00%. Physical Examination: General appearance - alert, well appearing, and in no distress.  Appeared upset and angry but, was able to move without difficulty.  I palpated her belly during the entire first part of the conversation and could not palpate any contractions and did not elicit any tenderness Abdomen - soft, nontender, nondistended, no masses or organomegaly; gravid Cervical Exam: Not evaluated. Membranes:intact, ruptured  Fetal Monitoring:  Baseline: 150's bpm and Variability: Fair (1-6 bpm) toco: no ctx noted  Labs:  Results for orders placed during the hospital encounter of 09/14/13 (from the past 24 hour(s))  CBC   Collection  Time    09/23/13  5:20 AM      Result Value Ref Range   WBC 13.8 (*) 4.0 - 10.5 K/uL   RBC 3.89  3.87 - 5.11 MIL/uL   Hemoglobin 11.4 (*) 12.0 - 15.0 g/dL   HCT 40.933.4 (*) 81.136.0 - 91.446.0 %   MCV 85.9  78.0 - 100.0 fL   MCH 29.3  26.0 - 34.0 pg   MCHC 34.1  30.0 - 36.0 g/dL   RDW 78.214.8  95.611.5 - 21.315.5 %   Platelets 291  150 - 400 K/uL    Imaging Studies:    n/a  Medications:  Scheduled . sodium chloride   Intravenous Once  . docusate sodium  100 mg Oral Daily  . famotidine  20 mg Oral BID  . prenatal multivitamin  1 tablet Oral Q1200  . zolpidem  10 mg Oral Once   I have reviewed the patient's current medications.  ASSESSMENT: Patient Active Problem List   Diagnosis Date Noted  . Preterm premature rupture of membranes in third trimester 09/14/2013  . Supervision of high-risk pregnancy 07/03/2013  . History of prior pregnancy with short cervix, currently pregnant in second trimester 07/03/2013    PLAN: IVF blous Percocet prn Ambien 10mg  x1 now Continue routine antenatal care.   HARRAWAY-SMITH, Ivyanna Sibert 09/23/2013,10:39 PM

## 2013-09-23 NOTE — Progress Notes (Signed)
Ur chart review completed.  

## 2013-09-24 ENCOUNTER — Encounter (HOSPITAL_COMMUNITY): Payer: Self-pay | Admitting: *Deleted

## 2013-09-24 DIAGNOSIS — O3430 Maternal care for cervical incompetence, unspecified trimester: Secondary | ICD-10-CM

## 2013-09-24 DIAGNOSIS — Z3490 Encounter for supervision of normal pregnancy, unspecified, unspecified trimester: Secondary | ICD-10-CM

## 2013-09-24 DIAGNOSIS — O4290 Premature rupture of membranes, unspecified as to length of time between rupture and onset of labor, unspecified weeks of gestation: Secondary | ICD-10-CM

## 2013-09-24 DIAGNOSIS — O26879 Cervical shortening, unspecified trimester: Secondary | ICD-10-CM

## 2013-09-24 LAB — CBC
HCT: 37.6 % (ref 36.0–46.0)
HEMOGLOBIN: 13.2 g/dL (ref 12.0–15.0)
MCH: 29.9 pg (ref 26.0–34.0)
MCHC: 35.1 g/dL (ref 30.0–36.0)
MCV: 85.3 fL (ref 78.0–100.0)
Platelets: 291 10*3/uL (ref 150–400)
RBC: 4.41 MIL/uL (ref 3.87–5.11)
RDW: 14.4 % (ref 11.5–15.5)
WBC: 24.3 10*3/uL — AB (ref 4.0–10.5)

## 2013-09-24 LAB — RPR: RPR Ser Ql: NONREACTIVE

## 2013-09-24 MED ORDER — ACETAMINOPHEN 325 MG PO TABS: 650.0000 mg | ORAL_TABLET | ORAL | Status: DC | PRN

## 2013-09-24 MED ORDER — EPHEDRINE 5 MG/ML INJ: 10.0000 mg | INTRAVENOUS | Status: DC | PRN

## 2013-09-24 MED ORDER — FLEET ENEMA 7-19 GM/118ML RE ENEM: 1.0000 | ENEMA | RECTAL | Status: DC | PRN

## 2013-09-24 MED ORDER — LACTATED RINGERS IV SOLN: 500.0000 mL | Freq: Once | INTRAVENOUS | Status: DC

## 2013-09-24 MED ORDER — DIPHENHYDRAMINE HCL 50 MG/ML IJ SOLN: 12.5000 mg | INTRAMUSCULAR | Status: DC | PRN

## 2013-09-24 MED ORDER — OXYCODONE-ACETAMINOPHEN 5-325 MG PO TABS
1.0000 | ORAL_TABLET | ORAL | Status: DC | PRN
  Filled 2013-09-24 (×2): qty 1

## 2013-09-24 MED ORDER — BENZOCAINE-MENTHOL 20-0.5 % EX AERO
1.0000 "application " | INHALATION_SPRAY | CUTANEOUS | Status: DC | PRN
  Filled 2013-09-24 (×2): qty 56

## 2013-09-24 MED ORDER — OXYTOCIN 40 UNITS IN LACTATED RINGERS INFUSION - SIMPLE MED
INTRAVENOUS | Status: AC
  Filled 2013-09-24: qty 1000

## 2013-09-24 MED ORDER — FENTANYL 2.5 MCG/ML BUPIVACAINE 1/10 % EPIDURAL INFUSION (WH - ANES): 14.0000 mL/h | INTRAMUSCULAR | Status: DC | PRN

## 2013-09-24 MED ORDER — IBUPROFEN 600 MG PO TABS
600.0000 mg | ORAL_TABLET | Freq: Four times a day (QID) | ORAL | Status: DC | PRN
  Administered 2013-09-24: 600 mg via ORAL
  Filled 2013-09-24: qty 1

## 2013-09-24 MED ORDER — LACTATED RINGERS IV SOLN: INTRAVENOUS | Status: DC

## 2013-09-24 MED ORDER — OXYTOCIN BOLUS FROM INFUSION
500.0000 mL | INTRAVENOUS | Status: DC
  Administered 2013-09-24: 500 mL via INTRAVENOUS

## 2013-09-24 MED ORDER — LACTATED RINGERS IV SOLN: 500.0000 mL | INTRAVENOUS | Status: DC | PRN

## 2013-09-24 MED ORDER — ONDANSETRON HCL 4 MG/2ML IJ SOLN: 4.0000 mg | Freq: Four times a day (QID) | INTRAMUSCULAR | Status: DC | PRN

## 2013-09-24 MED ORDER — PHENYLEPHRINE 40 MCG/ML (10ML) SYRINGE FOR IV PUSH (FOR BLOOD PRESSURE SUPPORT): 80.0000 ug | PREFILLED_SYRINGE | INTRAVENOUS | Status: DC | PRN

## 2013-09-24 MED ORDER — OXYTOCIN 40 UNITS IN LACTATED RINGERS INFUSION - SIMPLE MED: 62.5000 mL/h | INTRAVENOUS | Status: DC

## 2013-09-24 MED ORDER — LIDOCAINE HCL (PF) 1 % IJ SOLN: 30.0000 mL | INTRAMUSCULAR | Status: DC | PRN

## 2013-09-24 MED ORDER — IBUPROFEN 600 MG PO TABS: 600.0000 mg | ORAL_TABLET | Freq: Four times a day (QID) | ORAL | Status: DC | PRN

## 2013-09-24 MED ORDER — ZOLPIDEM TARTRATE 5 MG PO TABS: 5.0000 mg | ORAL_TABLET | Freq: Every evening | ORAL | Status: DC | PRN

## 2013-09-24 MED ORDER — SODIUM CHLORIDE 0.9 % IV SOLN
2.0000 g | Freq: Once | INTRAVENOUS | Status: DC
  Filled 2013-09-24: qty 2000

## 2013-09-24 MED ORDER — CITRIC ACID-SODIUM CITRATE 334-500 MG/5ML PO SOLN
30.0000 mL | ORAL | Status: DC | PRN
Start: 2013-09-24 — End: 2013-09-24

## 2013-09-24 MED ORDER — CITRIC ACID-SODIUM CITRATE 334-500 MG/5ML PO SOLN
ORAL | Status: AC
  Filled 2013-09-24: qty 15

## 2013-09-24 NOTE — Progress Notes (Signed)
Patient taken off external fetal monitoring, refusing monitoring at this time.

## 2013-09-24 NOTE — Discharge Instructions (Signed)

## 2013-09-24 NOTE — Progress Notes (Signed)
RN in process of making delivery call, delivery imminent, code apgar called

## 2013-09-24 NOTE — Progress Notes (Addendum)
S:  Called by nursing to evaluate patient for persistent pain and refusing IV Fluids. Upon arrival, pt states she has been bleeding and it is getting worse. +FM.   O: Filed Vitals:   09/23/13 1205 09/23/13 1635 09/23/13 1951 09/23/13 2254  BP: 112/65 135/82 135/81 118/70  Pulse: 105 104 110 122  Temp: 98.8 F (37.1 C) 98.9 F (37.2 C) 98.7 F (37.1 C) 99.3 F (37.4 C)  TempSrc: Oral Oral Oral Oral  Resp: 20 20 18 18   Height:      Weight:      SpO2:       Gen: appears uncomfortable in bed ABD: soft, NT/ND GU: gross blood on legs and perineum, 2 clots on her pad.  On speculum exam, head noted with cervix not visualized all the way around.   SVE: 6/90/0  A/P Active preterm labor after PPROM  1) transfer to L&D 2) routine orders 3) epidural 4) FWB- reassuring.    Anticipate SVD. NICU notified.    Jaryn Hocutt, Redmond BasemanKELI L, MD
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
## 2013-09-24 NOTE — Progress Notes (Signed)
Patient refusing peripheral IV and IV fluids.  Patient complains of bleeding.  Pink tinged watery fluid noted on patients peripad.  MD in department and notified of patient refusing IV and the patients complaint of vaginal bleeding.  No orders received.

## 2013-09-24 NOTE — Progress Notes (Signed)
Pt discharged home ambulatory, discharge and follow up instructions given, family at bedside, pt verbalizes understanding with no questions at this time, pt escorted by RN to car
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
## 2013-09-24 NOTE — Progress Notes (Signed)
RN requested additional help

## 2013-09-24 NOTE — Progress Notes (Signed)
MD notified of patients complaints of pain level, vaginal pressure, and uterine contractions.  MD reviewed strip remotely.  No orders were received.  Patient took herself off the external fetal monitoring stating "I'm tired of this"

## 2013-09-27 NOTE — Discharge Summary (Signed)
i initially evaluated the patient as above.  She had no palpable contractions but was offered pain meds and IVF's and continued fetal monitoring. Pt refused fetal monitoring and IVF's.  She She subsequently began to have vaginal bleeding and a drop in FHR and was delivered.  The fetal did not respond to resuscitation and coded despite an cord pH of 7.3.  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  Autopsy is pending.  The patient requested discharge immediately after delivery but, remained ~4 hours.    Attestation of Attending Supervision of Fellow: Evaluation and management procedures were performed by the Fellow under my supervision and collaboration.  I have reviewed the Fellow's note and chart, and I agree with the management and plan.

## 2013-10-03 ENCOUNTER — Encounter: Payer: Medicaid Other | Admitting: Obstetrics and Gynecology

## 2013-10-12 ENCOUNTER — Emergency Department (HOSPITAL_COMMUNITY)
Admission: EM | Admit: 2013-10-12 | Discharge: 2013-10-12 | Disposition: A | Discharge status: Home / Self Care | Payer: Medicaid Other | Attending: Emergency Medicine | Admitting: Emergency Medicine

## 2013-10-12 ENCOUNTER — Encounter (HOSPITAL_COMMUNITY): Payer: Self-pay | Admitting: Emergency Medicine

## 2013-10-12 ENCOUNTER — Emergency Department (HOSPITAL_COMMUNITY): Payer: Medicaid Other

## 2013-10-12 DIAGNOSIS — K5900 Constipation, unspecified: Secondary | ICD-10-CM

## 2013-10-12 DIAGNOSIS — F17200 Nicotine dependence, unspecified, uncomplicated: Secondary | ICD-10-CM | POA: Insufficient documentation

## 2013-10-12 DIAGNOSIS — Y939 Activity, unspecified: Secondary | ICD-10-CM | POA: Insufficient documentation

## 2013-10-12 DIAGNOSIS — IMO0002 Reserved for concepts with insufficient information to code with codable children: Secondary | ICD-10-CM | POA: Insufficient documentation

## 2013-10-12 DIAGNOSIS — S8002XA Contusion of left knee, initial encounter: Secondary | ICD-10-CM

## 2013-10-12 DIAGNOSIS — J45909 Unspecified asthma, uncomplicated: Secondary | ICD-10-CM | POA: Insufficient documentation

## 2013-10-12 DIAGNOSIS — Z79899 Other long term (current) drug therapy: Secondary | ICD-10-CM | POA: Insufficient documentation

## 2013-10-12 DIAGNOSIS — S80212A Abrasion, left knee, initial encounter: Secondary | ICD-10-CM

## 2013-10-12 DIAGNOSIS — R296 Repeated falls: Secondary | ICD-10-CM | POA: Insufficient documentation

## 2013-10-12 DIAGNOSIS — S8000XA Contusion of unspecified knee, initial encounter: Secondary | ICD-10-CM | POA: Insufficient documentation

## 2013-10-12 DIAGNOSIS — Y929 Unspecified place or not applicable: Secondary | ICD-10-CM | POA: Insufficient documentation

## 2013-10-12 MED ORDER — OXYCODONE-ACETAMINOPHEN 5-325 MG PO TABS
1.0000 | ORAL_TABLET | Freq: Once | ORAL | Status: AC
  Administered 2013-10-12: 1 via ORAL
  Filled 2013-10-12: qty 1

## 2013-10-12 MED ORDER — POLYETHYLENE GLYCOL 3350 17 GM/SCOOP PO POWD: 17.0000 g | Freq: Every day | ORAL | Status: DC

## 2013-10-12 MED ORDER — POLYETHYLENE GLYCOL 3350 17 G PO PACK
17.0000 g | PACK | Freq: Every day | ORAL | Status: DC
  Administered 2013-10-12: 17 g via ORAL
  Filled 2013-10-12: qty 1

## 2013-10-12 MED ORDER — IBUPROFEN 600 MG PO TABS: 600.0000 mg | ORAL_TABLET | Freq: Three times a day (TID) | ORAL | Status: DC | PRN

## 2013-10-12 NOTE — ED Notes (Signed)
MD at bedside. 
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
## 2013-10-12 NOTE — Discharge Instructions (Signed)
Abrasion An abrasion is a cut or scrape of the skin. Abrasions do not extend through all layers of the skin and most heal within 10 days. It is important to care for your abrasion properly to prevent infection. CAUSES  Most abrasions are caused by falling on, or gliding across, the ground or other surface. When your skin rubs on something, the outer and inner layer of skin rubs off, causing an abrasion. DIAGNOSIS  Your caregiver will be able to diagnose an abrasion during a physical exam.  TREATMENT  Your treatment depends on how large and deep the abrasion is. Generally, your abrasion will be cleaned with water and a mild soap to remove any dirt or debris. An antibiotic ointment may be put over the abrasion to prevent an infection. A bandage (dressing) may be wrapped around the abrasion to keep it from getting dirty.  You may need a tetanus shot if:  You cannot remember when you had your last tetanus shot.  You have never had a tetanus shot.  The injury broke your skin. If you get a tetanus shot, your arm may swell, get red, and feel warm to the touch. This is common and not a problem. If you need a tetanus shot and you choose not to have one, there is a rare chance of getting tetanus. Sickness from tetanus can be serious.  HOME CARE INSTRUCTIONS   If a dressing was applied, change it at least once a day or as directed by your caregiver. If the bandage sticks, soak it off with warm water.   Wash the area with water and a mild soap to remove all the ointment 2 times a day. Rinse off the soap and pat the area dry with a clean towel.   Reapply any ointment as directed by your caregiver. This will help prevent infection and keep the bandage from sticking. Use gauze over the wound and under the dressing to help keep the bandage from sticking.   Change your dressing right away if it becomes wet or dirty.   Only take over-the-counter or prescription medicines for pain, discomfort, or fever as  directed by your caregiver.   Follow up with your caregiver within 24 48 hours for a wound check, or as directed. If you were not given a wound-check appointment, look closely at your abrasion for redness, swelling, or pus. These are signs of infection. SEEK IMMEDIATE MEDICAL CARE IF:   You have increasing pain in the wound.   You have redness, swelling, or tenderness around the wound.   You have pus coming from the wound.   You have a fever or persistent symptoms for more than 2 3 days.  You have a fever and your symptoms suddenly get worse.  You have a bad smell coming from the wound or dressing.  MAKE SURE YOU:   Understand these instructions.  Will watch your condition.  Will get help right away if you are not doing well or get worse. Document Released: 04/20/2005 Document Revised: 06/27/2012 Document Reviewed: 06/14/2011 [facility] Patient Information 2014 [place], [place].  Constipation, Adult Constipation is when a person has fewer than 3 bowel movements a week; has difficulty having a bowel movement; or has stools that are dry, hard, or larger than normal. As people grow older, constipation is more common. If you try to fix constipation with medicines that make you have a bowel movement (laxatives), the problem may get worse. Long-term laxative use may cause the muscles of the colon to  become weak. A low-fiber diet, not taking in enough fluids, and taking certain medicines may make constipation worse. CAUSES   Certain medicines, such as antidepressants, pain medicine, iron supplements, antacids, and water pills.   Certain diseases, such as diabetes, irritable bowel syndrome (IBS), thyroid disease, or depression.   Not drinking enough water.   Not eating enough fiber-rich foods.   Stress or travel.  Lack of physical activity or exercise.  Not going to the restroom when there is the urge to have a bowel movement.  Ignoring the urge to have a bowel  movement.  Using laxatives too much. SYMPTOMS   Having fewer than 3 bowel movements a week.   Straining to have a bowel movement.   Having hard, dry, or larger than normal stools.   Feeling full or bloated.   Pain in the lower abdomen.  Not feeling relief after having a bowel movement. DIAGNOSIS  Your caregiver will take a medical history and perform a physical exam. Further testing may be done for severe constipation. Some tests may include:   A barium enema X-ray to examine your rectum, colon, and sometimes, your small intestine.  A sigmoidoscopy to examine your lower colon.  A colonoscopy to examine your entire colon. TREATMENT  Treatment will depend on the severity of your constipation and what is causing it. Some dietary treatments include drinking more fluids and eating more fiber-rich foods. Lifestyle treatments may include regular exercise. If these diet and lifestyle recommendations do not help, your caregiver may recommend taking over-the-counter laxative medicines to help you have bowel movements. Prescription medicines may be prescribed if over-the-counter medicines do not work.  HOME CARE INSTRUCTIONS   Increase dietary fiber in your diet, such as fruits, vegetables, whole grains, and beans. Limit high-fat and processed sugars in your diet, such as [place] fries, hamburgers, cookies, candies, and soda.   A fiber supplement may be added to your diet if you cannot get enough fiber from foods.   Drink enough fluids to keep your urine clear or pale yellow.   Exercise regularly or as directed by your caregiver.   Go to the restroom when you have the urge to go. Do not hold it.  Only take medicines as directed by your caregiver. Do not take other medicines for constipation without talking to your caregiver first. SEEK IMMEDIATE MEDICAL CARE IF:   You have bright red blood in your stool.   Your constipation lasts for more than 4 days or gets worse.   You  have abdominal or rectal pain.   You have thin, pencil-like stools.  You have unexplained weight loss. MAKE SURE YOU:   Understand these instructions.  Will watch your condition.  Will get help right away if you are not doing well or get worse. Document Released: 04/08/2004 Document Revised: 10/03/2011 Document Reviewed: 04/22/2013 [facility] Patient Information 2014 [place].

## 2013-10-13 NOTE — ED Provider Notes (Signed)
CSN: [id number]     Arrival date & time 10/12/13  1214 History   First MD Initiated Contact with Patient 10/12/13 1238     Chief Complaint  Patient presents with  . Constipation     HPI She presents the emergency department because of constipation.  She states she has had a good solid bowel movement for the past 3-4 days.  She's never had constipation before.  She's tried laxatives without improvement in her symptoms.  She states that every time she tries to have a bowel movement she developed some pain and has a small amount of bleeding.  She denies abdominal pain.  She denies nausea or vomiting.  No fevers or chills.  No chest pain shortness of breath.  She does also report that she fell 3-4 days ago and has left knee pain.  She has not been taking any narcotic pain medications for her knee.  She denies use/abuse of narcotic pain medications.she denies weakness of his left lower extremity.  No other complaints.  No urinary complaints.    Past Medical History  Diagnosis Date  . Asthma     No inhaler use x 1 yr (2013)   Past Surgical History  Procedure Laterality Date  . Eye surgery     Family History  Problem Relation Age of Onset  . Hypertension Maternal Grandmother   . Diabetes Maternal Grandmother    History  Substance Use Topics  . Smoking status: Current Some Day Smoker -- 0.50 packs/day    Types: Cigarettes  . Smokeless tobacco: Never Used  . Alcohol Use: No   OB History   Grav Para Term Preterm Abortions TAB SAB Ect Mult Living   4 2 1 1 2 1 1  0 0 1     Review of Systems  All other systems reviewed and are negative.      Allergies  Review of patient's allergies indicates no known allergies.  Home Medications   Current Outpatient Rx  Name  Route  Sig  Dispense  Refill  . albuterol (PROVENTIL HFA;VENTOLIN HFA) 108 (90 BASE) MCG/ACT inhaler   Inhalation   Inhale 2 puffs into the lungs every 6 (six) hours as needed (asthma).         . ibuprofen  (ADVIL,MOTRIN) 600 MG tablet   Oral   Take 1 tablet (600 mg total) by mouth every 8 (eight) hours as needed.   15 tablet   0   . polyethylene glycol powder (MIRALAX) powder   Oral   Take 17 g by mouth daily.   255 g   0    BP 152/96  Pulse 66  Temp(Src) 98.7 F (37.1 C) (Oral)  Resp 14  SpO2 96% Physical Exam  Nursing note and vitals reviewed. Constitutional: She is oriented to person, place, and time. She appears well-developed and well-nourished. No distress.  HENT:  Head: Normocephalic and atraumatic.  Eyes: EOM are normal.  Neck: Normal range of motion.  Cardiovascular: Normal rate, regular rhythm and normal heart sounds.   Pulmonary/Chest: Effort normal and breath sounds normal.  Abdominal: Soft. She exhibits no distension. There is no tenderness.  Genitourinary:  No gross blood on gloved finger. The hemorrhage present.  No significant stool burden encountered  Musculoskeletal: Normal range of motion.  Mild pain with range of motion of the left knee.  Mild tenderness of the lateral joint line of the left knee.  Small abrasion overlying the patella.  No erythema or warmth to suggest cellulitis.  Neurological: She is alert and oriented to person, place, and time.  Skin: Skin is warm and dry.  Psychiatric: She has a normal mood and affect. Judgment normal.    ED Course  Procedures (including critical care time) Labs Review Labs Reviewed - No data to display Imaging Review Dg Knee Complete 4 Views Left  10/12/2013   CLINICAL DATA:  28 year old female with left knee injury and pain.  EXAM: LEFT KNEE - COMPLETE 4+ VIEW  COMPARISON:  None.  FINDINGS: No evidence of acute fracture, subluxation or dislocation identified.  No joint effusion noted.  No radio-opaque foreign bodies are present.  No focal bony lesions are noted.  The joint spaces are unremarkable.  IMPRESSION: Negative.   Electronically Signed   By: Laveda AbbeJeff  Hu M.D.   On: 10/12/2013 14:21   Dg Abd 2  Views  10/12/2013   CLINICAL DATA:  28 year old female with abdominal pain.  EXAM: ABDOMEN - 2 VIEW  COMPARISON:  None.  FINDINGS: A few scattered nondistended gas and fluid filled small bowel loops are identified within the abdomen.  No dilated bowel loops are noted.  Gas in the colon and rectum are present.  A small to moderate amount of colonic stool is present.  No suspicious calcifications are noted. There is no evidence of pneumoperitoneum.  IMPRESSION: Nonspecific nonobstructive bowel gas pattern - no evidence of pneumoperitoneum.   Electronically Signed   By: Laveda AbbeJeff  Hu M.D.   On: 10/12/2013 14:16  I personally reviewed the imaging tests through PACS system I reviewed available ER/hospitalization records through the EMR    EKG Interpretation None      MDM   Final diagnoses:  Constipation  Contusion of left knee  Abrasion of left knee    Constipation.  We'll start her MiraLAX.  Left knee contusion.  X-rays negative.  PCP followup.  She understands return to the ER for new or worsening symptoms.    Lyanne CoKevin M Anjolina Byrer, MD 10/13/13 [phone]

## 2013-10-18 ENCOUNTER — Ambulatory Visit: Payer: Medicaid Other | Admitting: Obstetrics & Gynecology

## 2013-10-18 NOTE — Progress Notes (Signed)
Called and was unable to leave message due to phone not ringing.  Sent letter.

## 2014-01-16 ENCOUNTER — Encounter (HOSPITAL_COMMUNITY): Payer: Self-pay | Admitting: Emergency Medicine

## 2014-01-16 ENCOUNTER — Emergency Department (HOSPITAL_COMMUNITY): Payer: Medicaid Other

## 2014-01-16 ENCOUNTER — Emergency Department (HOSPITAL_COMMUNITY)
Admission: EM | Admit: 2014-01-16 | Discharge: 2014-01-16 | Disposition: A | Discharge status: Home / Self Care | Payer: Medicaid Other | Attending: Emergency Medicine | Admitting: Emergency Medicine

## 2014-01-16 DIAGNOSIS — Z79899 Other long term (current) drug therapy: Secondary | ICD-10-CM | POA: Insufficient documentation

## 2014-01-16 DIAGNOSIS — F17200 Nicotine dependence, unspecified, uncomplicated: Secondary | ICD-10-CM | POA: Insufficient documentation

## 2014-01-16 DIAGNOSIS — R51 Headache: Secondary | ICD-10-CM | POA: Insufficient documentation

## 2014-01-16 DIAGNOSIS — Z791 Long term (current) use of non-steroidal anti-inflammatories (NSAID): Secondary | ICD-10-CM | POA: Insufficient documentation

## 2014-01-16 DIAGNOSIS — G8929 Other chronic pain: Secondary | ICD-10-CM | POA: Insufficient documentation

## 2014-01-16 DIAGNOSIS — J45909 Unspecified asthma, uncomplicated: Secondary | ICD-10-CM | POA: Insufficient documentation

## 2014-01-16 DIAGNOSIS — H538 Other visual disturbances: Secondary | ICD-10-CM | POA: Insufficient documentation

## 2014-01-16 MED ORDER — DIPHENHYDRAMINE HCL 50 MG/ML IJ SOLN
25.0000 mg | Freq: Once | INTRAMUSCULAR | Status: AC
  Administered 2014-01-16: 25 mg via INTRAVENOUS
  Filled 2014-01-16: qty 1

## 2014-01-16 MED ORDER — METOCLOPRAMIDE HCL 5 MG/ML IJ SOLN
10.0000 mg | Freq: Once | INTRAMUSCULAR | Status: AC
  Administered 2014-01-16: 10 mg via INTRAVENOUS
  Filled 2014-01-16: qty 2

## 2014-01-16 MED ORDER — KETOROLAC TROMETHAMINE 30 MG/ML IJ SOLN
30.0000 mg | Freq: Once | INTRAMUSCULAR | Status: AC
  Administered 2014-01-16: 30 mg via INTRAVENOUS
  Filled 2014-01-16: qty 1
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
## 2014-01-16 NOTE — ED Notes (Signed)
Pt alert, arrives from home, c/o headache, onset was several years ago, pt states pain behind left eye, ambulates to triage, speech clear, resp even unlabored, skin pwd

## 2014-01-16 NOTE — ED Notes (Signed)
Patient transported to CT 

## 2014-01-16 NOTE — ED Notes (Signed)
PA at bedside.

## 2014-01-17 NOTE — ED Provider Notes (Signed)
Medical screening examination/treatment/procedure(s) were performed by non-physician practitioner and as supervising physician I was immediately available for consultation/collaboration.   EKG Interpretation None        Scott T Goldston, MD 01/17/14 0022 

## 2014-02-27 ENCOUNTER — Encounter (HOSPITAL_COMMUNITY): Payer: Self-pay | Admitting: Emergency Medicine

## 2014-02-27 ENCOUNTER — Emergency Department (HOSPITAL_COMMUNITY)
Admission: EM | Admit: 2014-02-27 | Discharge: 2014-02-27 | Disposition: A | Discharge status: Home / Self Care | Payer: Medicaid Other | Attending: Emergency Medicine | Admitting: Emergency Medicine

## 2014-02-27 ENCOUNTER — Emergency Department (HOSPITAL_COMMUNITY): Payer: Medicaid Other

## 2014-02-27 DIAGNOSIS — R079 Chest pain, unspecified: Secondary | ICD-10-CM | POA: Insufficient documentation

## 2014-02-27 DIAGNOSIS — I10 Essential (primary) hypertension: Secondary | ICD-10-CM | POA: Diagnosis not present

## 2014-02-27 DIAGNOSIS — F17200 Nicotine dependence, unspecified, uncomplicated: Secondary | ICD-10-CM | POA: Diagnosis not present

## 2014-02-27 DIAGNOSIS — R071 Chest pain on breathing: Secondary | ICD-10-CM | POA: Insufficient documentation

## 2014-02-27 DIAGNOSIS — M546 Pain in thoracic spine: Secondary | ICD-10-CM | POA: Diagnosis not present

## 2014-02-27 DIAGNOSIS — J45909 Unspecified asthma, uncomplicated: Secondary | ICD-10-CM | POA: Diagnosis not present

## 2014-02-27 DIAGNOSIS — R0789 Other chest pain: Secondary | ICD-10-CM

## 2014-02-27 HISTORY — DX: Lordosis, unspecified, site unspecified: M40.50

## 2014-02-27 HISTORY — DX: Essential (primary) hypertension: I10

## 2014-02-27 LAB — BASIC METABOLIC PANEL
Anion gap: 13 (ref 5–15)
BUN: 11 mg/dL (ref 6–23)
CHLORIDE: 101 meq/L (ref 96–112)
CO2: 23 mEq/L (ref 19–32)
Calcium: 9.1 mg/dL (ref 8.4–10.5)
Creatinine, Ser: 1.01 mg/dL (ref 0.50–1.10)
GFR calc Af Amer: 88 mL/min — ABNORMAL LOW (ref >90)
GFR calc non Af Amer: 76 mL/min — ABNORMAL LOW (ref >90)
GLUCOSE: 99 mg/dL (ref 70–99)
POTASSIUM: 3.5 meq/L — AB (ref 3.7–5.3)
SODIUM: 137 meq/L (ref 137–147)

## 2014-02-27 LAB — CBC
HCT: 38 % (ref 36.0–46.0)
Hemoglobin: 12.8 g/dL (ref 12.0–15.0)
MCH: 29 pg (ref 26.0–34.0)
MCHC: 33.7 g/dL (ref 30.0–36.0)
MCV: 86 fL (ref 78.0–100.0)
Platelets: 288 10*3/uL (ref 150–400)
RBC: 4.42 MIL/uL (ref 3.87–5.11)
RDW: 15.3 % (ref 11.5–15.5)
WBC: 3.9 10*3/uL — ABNORMAL LOW (ref 4.0–10.5)

## 2014-02-27 LAB — PRO B NATRIURETIC PEPTIDE: Pro B Natriuretic peptide (BNP): 55.6 pg/mL (ref 0–125)

## 2014-02-27 LAB — I-STAT TROPONIN, ED: TROPONIN I, POC: 0.01 ng/mL (ref 0.00–0.08)

## 2014-02-27 MED ORDER — TRAMADOL HCL 50 MG PO TABS: 50.0000 mg | ORAL_TABLET | Freq: Four times a day (QID) | ORAL | Status: DC | PRN

## 2014-02-27 MED ORDER — METHOCARBAMOL 500 MG PO TABS: 500.0000 mg | ORAL_TABLET | Freq: Two times a day (BID) | ORAL | Status: DC

## 2014-02-27 NOTE — ED Notes (Signed)
Pt c/o intermittent central chest pain x 2 weeks and back pain.  Pt states that it comes and goes, sometimes last for an hour.  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

## 2014-02-27 NOTE — ED Provider Notes (Signed)
CSN: [id number]     Arrival date & time 02/27/14  1337 History   First MD Initiated Contact with Patient 02/27/14 1350     Chief Complaint  Patient presents with  . Chest Pain  . Back Pain     (Consider location/radiation/quality/duration/timing/severity/associated sxs/prior Treatment) HPI Comments: Patient presents today with a chief complaint of chest pain, back pain, bilateral ankle pain, and headache.  She reports that symptoms have been present for the past couple of weeks.  She reports that the pain is located across her chest and describes the pain as an ache.  Pain does not radiate.  Pain not associated with exertion.  She has not been taking anything for her symptoms.   She denies any change in symptoms over the past couple of weeks.  She denies fever, chills, nausea, vomiting, SOB, cough, or abdominal pain.  She denies prolonged travel or surgeries in the past 4 weeks.  Denies history of DVT or PE.  Denies LE edema.  Denies use of estrogen containing medications.  She denies any prior cardiac history.  Denies any recent trauma or injury.    The history is provided by the patient.    Past Medical History  Diagnosis Date  . Asthma     No inhaler use x 1 yr (2013)  . Lordosis   . Hypertension    Past Surgical History  Procedure Laterality Date  . Eye surgery     Family History  Problem Relation Age of Onset  . Hypertension Maternal Grandmother   . Diabetes Maternal Grandmother    History  Substance Use Topics  . Smoking status: Current Some Day Smoker -- 0.50 packs/day    Types: Cigarettes  . Smokeless tobacco: Never Used  . Alcohol Use: 0.0 oz/week   OB History   Grav Para Term Preterm Abortions TAB SAB Ect Mult Living   4 2 1 1 2 1 1  0 0 1     Review of Systems  All other systems reviewed and are negative.     Allergies  Review of patient's allergies indicates no known allergies.  Home Medications   Prior to Admission medications   Medication Sig Start  Date End Date Taking? Authorizing Provider  acetaminophen (TYLENOL) 500 MG tablet Take 500 mg by mouth every 6 (six) hours as needed for mild pain.   Yes Historical Provider, MD  naproxen (NAPROSYN) 500 MG tablet Take 500 mg by mouth as needed for headache.   Yes Historical Provider, MD   BP 148/96  Pulse 71  Resp 18  SpO2 100%  LMP 01/28/2014 Physical Exam  Nursing note and vitals reviewed. Constitutional: She appears well-developed and well-nourished. No distress.  HENT:  Head: Normocephalic and atraumatic.  Mouth/Throat: Oropharynx is clear and moist.  Eyes: EOM are normal. Pupils are equal, round, and reactive to light.  Neck: Normal range of motion. Neck supple.  Cardiovascular: Normal rate, regular rhythm and normal heart sounds.   Pulmonary/Chest: Effort normal and breath sounds normal. No respiratory distress. She has no wheezes. She has no rales. She exhibits tenderness.  Abdominal: Soft. She exhibits no distension and no mass. There is no tenderness. There is no rebound and no guarding.  Musculoskeletal: Normal range of motion.       Right ankle: She exhibits normal range of motion and no swelling. No tenderness.       Left ankle: She exhibits normal range of motion and no swelling. No tenderness.  No LE edema on  exam  Neurological: She is alert. She has normal strength. No cranial nerve deficit or sensory deficit. Coordination and gait normal.  Skin: Skin is warm and dry. She is not diaphoretic.  Psychiatric: She has a normal mood and affect.    ED Course  Procedures (including critical care time) Labs Review Labs Reviewed  CBC - Abnormal; Notable for the following:    WBC 3.9 (*)    All other components within normal limits  PRO B NATRIURETIC PEPTIDE  BASIC METABOLIC PANEL  I-STAT TROPOININ, ED  POC URINE PREG, ED    Imaging Review Dg Chest 2 View  02/27/2014   CLINICAL DATA:  Shortness of breath, asthma, smoker  EXAM: CHEST  2 VIEW  COMPARISON:  11/11/2010   FINDINGS: The heart size and mediastinal contours are within normal limits. Both lungs are clear. The visualized skeletal structures are unremarkable.  IMPRESSION: No active cardiopulmonary disease.   Electronically Signed   By: Natasha MeadLiviu  Pop M.D.   On: 02/27/2014 14:44     EKG Interpretation   Date/Time:  Thursday February 27 2014 13:42:58 EDT Ventricular Rate:  73 PR Interval:  148 QRS Duration: 86 QT Interval:  416 QTC Calculation: 458 R Axis:   53 Text Interpretation:  Normal sinus rhythm Possible Left atrial enlargement  Cannot rule out Anterior infarct , age undetermined Abnormal ECG No old  tracing to compare Confirmed by [facility] CenterWENTZ  MD, ELLIOTT [phone]([id number]) on 02/27/2014  3:18:51 PM      MDM   Final diagnoses:  None   Patient presenting with multiple complaints.  She reports that symptoms have been present for a couple of weeks and are unchanged.  She is complaining of chest pain.  Chest wall tender to palpation.  Chest pain is atypical.  No ischemic changes on EKG.  Troponin negative.  CXR negative.  Patient is also PERC negative.  Patient also complaining of bilateral ankle pain.  No injury.  No erythema or edema on exam.  Patient also with a headache.  Normal neurological exam.  Headache gradual in onset.  She had a recent CT of her head, which did not show any acute findings.  Therefore, do not feel that any further imaging is indicated.  Patient is afebrile.  VSS.  Feel that the patient is stable for discharge.  Patient instructed to follow up with PCP.  Return precautions given.      Santiago GladHeather Emmett Bracknell, PA-C 03/02/14 2313

## 2014-02-27 NOTE — ED Notes (Signed)
Pt aware we need urine sample. Unable to at this time.

## 2014-02-27 NOTE — ED Notes (Addendum)
Initial contact-A&Ox4. Moving all extremities, ambulatory. Reporting stabbing, consistent pain in the neck, back, and head. Reports that she experiences numbness and tingling in the arms and legs. Reports chest tightness at this time. Pain radiates to bilateral arms, especially left side. Reports dizziness. No recent injuries. No precipitating and alleviating factors. Hx asthma. Doesn't use inhaler. Reports pain with deep breaths. Feels SOB when pain comes and "I feel burn, not like a heartburn." Reports bilateral ankle pain. Reports ankle swelling recently. Denies sitting or standing for long periods of time recently. Also c/o eye socket pain and ear "leaking" starting recently. Reports blurry vision on left side and "memory loss." Denies D, V, F, chills, difficulty urinating but has had nausea. Denies there is a chance she could be pregnant. No hx cardiac problems. Hx HTN (diagnosed at 28 yo). Hx familial cardiac problems (CHF). In NAD. Awaiting PA/MD.

## 2014-02-27 NOTE — ED Notes (Signed)
Patient aware she is not to drive, operate machinery, or drink alcohol while taking prescribed medications given at discharge. RR even/unlabored. Patient in NAD. AVS explained in detail. No other questions/concerns noted.

## 2014-03-03 NOTE — ED Provider Notes (Signed)
Medical screening examination/treatment/procedure(s) were performed by non-physician practitioner and as supervising physician I was immediately available for consultation/collaboration.   EKG Interpretation   Date/Time:  Thursday February 27 2014 13:42:58 EDT Ventricular Rate:  73 PR Interval:  148 QRS Duration: 86 QT Interval:  416 QTC Calculation: 458 R Axis:   53 Text Interpretation:  Normal sinus rhythm Possible Left atrial enlargement  Cannot rule out Anterior infarct , age undetermined Abnormal ECG No old  tracing to compare Confirmed by [facility]  MD, Latravis Grine [phone]([id number] on 02/27/2014  3:18:51 PM       Flint MelterElliott L Rivkah Wolz, MD 03/03/14 [phone]

## 2014-05-13 ENCOUNTER — Encounter (HOSPITAL_COMMUNITY): Payer: Self-pay | Admitting: Emergency Medicine

## 2014-05-13 ENCOUNTER — Emergency Department (HOSPITAL_COMMUNITY): Payer: Medicaid Other

## 2014-05-13 ENCOUNTER — Emergency Department (HOSPITAL_COMMUNITY)
Admission: EM | Admit: 2014-05-13 | Discharge: 2014-05-13 | Disposition: A | Discharge status: Home / Self Care | Payer: Medicaid Other | Attending: Emergency Medicine | Admitting: Emergency Medicine

## 2014-05-13 DIAGNOSIS — J45909 Unspecified asthma, uncomplicated: Secondary | ICD-10-CM | POA: Diagnosis not present

## 2014-05-13 DIAGNOSIS — O2690 Pregnancy related conditions, unspecified, unspecified trimester: Secondary | ICD-10-CM

## 2014-05-13 DIAGNOSIS — O99511 Diseases of the respiratory system complicating pregnancy, first trimester: Secondary | ICD-10-CM | POA: Insufficient documentation

## 2014-05-13 DIAGNOSIS — O23591 Infection of other part of genital tract in pregnancy, first trimester: Secondary | ICD-10-CM | POA: Insufficient documentation

## 2014-05-13 DIAGNOSIS — O200 Threatened abortion: Secondary | ICD-10-CM

## 2014-05-13 DIAGNOSIS — Z8739 Personal history of other diseases of the musculoskeletal system and connective tissue: Secondary | ICD-10-CM | POA: Diagnosis not present

## 2014-05-13 DIAGNOSIS — M25561 Pain in right knee: Secondary | ICD-10-CM | POA: Insufficient documentation

## 2014-05-13 DIAGNOSIS — O99331 Smoking (tobacco) complicating pregnancy, first trimester: Secondary | ICD-10-CM | POA: Insufficient documentation

## 2014-05-13 DIAGNOSIS — Z3A01 Less than 8 weeks gestation of pregnancy: Secondary | ICD-10-CM | POA: Diagnosis not present

## 2014-05-13 DIAGNOSIS — F17210 Nicotine dependence, cigarettes, uncomplicated: Secondary | ICD-10-CM | POA: Diagnosis not present

## 2014-05-13 DIAGNOSIS — B9689 Other specified bacterial agents as the cause of diseases classified elsewhere: Secondary | ICD-10-CM | POA: Diagnosis not present

## 2014-05-13 DIAGNOSIS — O9989 Other specified diseases and conditions complicating pregnancy, childbirth and the puerperium: Secondary | ICD-10-CM | POA: Insufficient documentation

## 2014-05-13 DIAGNOSIS — O10011 Pre-existing essential hypertension complicating pregnancy, first trimester: Secondary | ICD-10-CM | POA: Diagnosis not present

## 2014-05-13 DIAGNOSIS — O208 Other hemorrhage in early pregnancy: Secondary | ICD-10-CM | POA: Diagnosis present

## 2014-05-13 DIAGNOSIS — N760 Acute vaginitis: Secondary | ICD-10-CM

## 2014-05-13 LAB — WET PREP, GENITAL
Trich, Wet Prep: NONE SEEN
Yeast Wet Prep HPF POC: NONE SEEN

## 2014-05-13 LAB — URINALYSIS, ROUTINE W REFLEX MICROSCOPIC
Bilirubin Urine: NEGATIVE
Glucose, UA: NEGATIVE mg/dL
Hgb urine dipstick: NEGATIVE
Ketones, ur: NEGATIVE mg/dL
Leukocytes, UA: NEGATIVE
Nitrite: NEGATIVE
Protein, ur: NEGATIVE mg/dL
Specific Gravity, Urine: 1.012 (ref 1.005–1.030)
UROBILINOGEN UA: 0.2 mg/dL (ref 0.0–1.0)
pH: 6.5 (ref 5.0–8.0)

## 2014-05-13 LAB — HIV ANTIBODY (ROUTINE TESTING W REFLEX): HIV 1&2 Ab, 4th Generation: NONREACTIVE

## 2014-05-13 LAB — RPR

## 2014-05-13 LAB — I-STAT BETA HCG BLOOD, ED (MC, WL, AP ONLY)

## 2014-05-13 LAB — POC URINE PREG, ED: PREG TEST UR: POSITIVE — AB

## 2014-05-13 MED ORDER — METRONIDAZOLE 500 MG PO TABS: 500.0000 mg | ORAL_TABLET | Freq: Two times a day (BID) | ORAL | Status: DC

## 2014-05-13 MED ORDER — ACETAMINOPHEN 325 MG PO TABS
650.0000 mg | ORAL_TABLET | Freq: Once | ORAL | Status: AC
  Administered 2014-05-13: 650 mg via ORAL
  Filled 2014-05-13: qty 2

## 2014-05-13 NOTE — ED Notes (Signed)
Patient transported to Ultrasound via stretcher Patient in NAD upon leaving for testing

## 2014-05-13 NOTE — ED Notes (Signed)
Pt reports she has recently  Had unprotected sex. Reports she may be on her period, unsure, having vaginal bleeding, also having vaginal odor and pain 8/10. Pt also c/o right knee, pt hit knee on wooden chair 3 days ago.

## 2014-05-13 NOTE — ED Notes (Signed)
Patient back from US Patient in NAD upon return from testing

## 2014-05-13 NOTE — ED Notes (Signed)
EDP at bedside  

## 2014-05-13 NOTE — Discharge Instructions (Signed)
Bacterial Vaginosis Bacterial vaginosis is a vaginal infection that occurs when the normal balance of bacteria in the vagina is disrupted. It results from an overgrowth of certain bacteria. This is the most common vaginal infection in women of childbearing age. Treatment is important to prevent complications, especially in pregnant women, as it can cause a premature delivery. CAUSES  Bacterial vaginosis is caused by an increase in harmful bacteria that are normally present in smaller amounts in the vagina. Several different kinds of bacteria can cause bacterial vaginosis. However, the reason that the condition develops is not fully understood. RISK FACTORS Certain activities or behaviors can put you at an increased risk of developing bacterial vaginosis, including:  Having a new sex partner or multiple sex partners.  Douching.  Using an intrauterine device (IUD) for contraception. Women do not get bacterial vaginosis from toilet seats, bedding, swimming pools, or contact with objects around them. SIGNS AND SYMPTOMS  Some women with bacterial vaginosis have no signs or symptoms. Common symptoms include:  Grey vaginal discharge.  A fishlike odor with discharge, especially after sexual intercourse.  Itching or burning of the vagina and vulva.  Burning or pain with urination. DIAGNOSIS  Your health care provider will take a medical history and examine the vagina for signs of bacterial vaginosis. A sample of vaginal fluid may be taken. Your health care provider will look at this sample under a microscope to check for bacteria and abnormal cells. A vaginal pH test may also be done.  TREATMENT  Bacterial vaginosis may be treated with antibiotic medicines. These may be given in the form of a pill or a vaginal cream. A second round of antibiotics may be prescribed if the condition comes back after treatment.  HOME CARE INSTRUCTIONS   Only take over-the-counter or prescription medicines as  directed by your health care provider.  If antibiotic medicine was prescribed, take it as directed. Make sure you finish it even if you start to feel better.  Do not have sex until treatment is completed.  Tell all sexual partners that you have a vaginal infection. They should see their health care provider and be treated if they have problems, such as a mild rash or itching.  Practice safe sex by using condoms and only having one sex partner. SEEK MEDICAL CARE IF:   Your symptoms are not improving after 3 days of treatment.  You have increased discharge or pain.  You have a fever. MAKE SURE YOU:   Understand these instructions.  Will watch your condition.  Will get help right away if you are not doing well or get worse. FOR MORE INFORMATION  Centers for Disease Control and Prevention, Division of STD Prevention: SolutionApps.co.zawww.cdc.gov/std American Sexual Health Association (ASHA): www.ashastd.org  Document Released: 07/11/2005 Document Revised: 05/01/2013 Document Reviewed: 02/20/2013 [facility] Patient Information 2015 [place], [place]. This information is not intended to replace advice given to you by your health care provider. Make sure you discuss any questions you have with your health care provider.  Threatened Miscarriage A threatened miscarriage is when you have vaginal bleeding during your first 20 weeks of pregnancy but the pregnancy has not ended. Your doctor will do tests to make sure you are still pregnant. The cause of the bleeding may not be known. This condition does not mean your pregnancy will end. It does increase the risk of it ending (complete miscarriage). HOME CARE   Make sure you keep all your doctor visits for prenatal care.  Get plenty of rest.  Do not have sex or use tampons if you have vaginal bleeding.  Do not douche.  Do not smoke or use drugs.  Do not drink alcohol.  Avoid caffeine. GET HELP IF:  You have light bleeding from your vagina.  You have  belly pain or cramping.  You have a fever. GET HELP RIGHT AWAY IF:   You have heavy bleeding from your vagina.  You have clots of blood coming from your vagina.  You have bad pain or cramps in your low back or belly.  You have fever, chills, and bad belly pain. MAKE SURE YOU:   Understand these instructions.  Will watch your condition.  Will get help right away if you are not doing well or get worse. Document Released: 06/23/2008 Document Revised: 07/16/2013 Document Reviewed: 05/07/2013 [facility] Patient Information 2015 [place], [place]. This information is not intended to replace advice given to you by your health care provider. Make sure you discuss any questions you have with your health care provider.

## 2014-05-13 NOTE — ED Notes (Signed)
Entered patient room to draw blood Patient asked, "What do you need blood for? I want to see the doctor." This nurse stated that I would go get Tiffany and the patient replied "Yeah, you do that." I left the room and shut the door behind me Patient then started yelling "Don't slam my fucking door just because I don't want blood drawn." Patient then opened the door and continued to yell and curse Security called

## 2014-05-13 NOTE — ED Provider Notes (Signed)
Medical screening examination/treatment/procedure(s) were performed by non-physician practitioner and as supervising physician I was immediately available for consultation/collaboration.   EKG Interpretation None       Arby BarretteMarcy Lashunta Frieden, MD 05/13/14 1620

## 2014-05-13 NOTE — ED Notes (Signed)
Patient now agrees to lab work--phlebotomy at bedside EDP at bedside

## 2014-05-13 NOTE — ED Provider Notes (Signed)
CSN: [id number]     Arrival date & time 05/13/14  0848 History   First MD Initiated Contact with Patient 05/13/14 [phone]     Chief Complaint  Patient presents with  . SEXUALLY TRANSMITTED DISEASE  . Vaginal Bleeding  . Knee Pain     (Consider location/radiation/quality/duration/timing/severity/associated sxs/prior Treatment) HPI  Patient to the ER with complaints of evaluation of vaginal bleeding. She reports having unprotected sex. Her last MP was on September 3 and she feels that she may be menstruating. She is G-1 P-2 to date. She is complaining of vaginal odor and pain. She is also concerned of right knee pain after hitting it on a wooden chair 3 days ago. She would like for this to be evaluated as well. She denies fevers, weakness, nausea, vomiting, diarrhea, weakness, or abdominal pains  Past Medical History  Diagnosis Date  . Asthma     No inhaler use x 1 yr (2013)  . Lordosis   . Hypertension    Past Surgical History  Procedure Laterality Date  . Eye surgery     Family History  Problem Relation Age of Onset  . Hypertension Maternal Grandmother   . Diabetes Maternal Grandmother    History  Substance Use Topics  . Smoking status: Current Some Day Smoker -- 1.00 packs/day for 10 years    Types: Cigarettes  . Smokeless tobacco: Never Used  . Alcohol Use: 0.0 oz/week   OB History   Grav Para Term Preterm Abortions TAB SAB Ect Mult Living   4 2 1 1 2 1 1  0 0 1     Review of Systems  All other systems reviewed and are negative.     Allergies  Review of patient's allergies indicates no known allergies.  Home Medications   Prior to Admission medications   Medication Sig Start Date End Date Taking? Authorizing Provider  ibuprofen (ADVIL,MOTRIN) 200 MG tablet Take 200 mg by mouth every 6 (six) hours as needed for moderate pain.   Yes Historical Provider, MD   BP 118/78  Pulse 74  Temp(Src) 97.8 F (36.6 C) (Oral)  Resp 16  SpO2 100%  LMP  05/13/2014 Physical Exam  Nursing note and vitals reviewed. Constitutional: She appears well-developed and well-nourished. No distress.  HENT:  Head: Normocephalic and atraumatic.  Eyes: Pupils are equal, round, and reactive to light.  Neck: Normal range of motion. Neck supple.  Cardiovascular: Normal rate and regular rhythm.   Pulmonary/Chest: Effort normal.  Abdominal: Soft.  Genitourinary: Uterus normal. Cervix exhibits no motion tenderness, no discharge and no friability. Right adnexum displays no mass and no tenderness. Left adnexum displays no mass and no tenderness. There is bleeding (scant blood in vaginal canal) around the vagina. No vaginal discharge found.  Cervix is closed  Musculoskeletal:       Right knee: She exhibits ecchymosis. She exhibits no swelling, no effusion, no deformity, no laceration and no erythema. Tenderness found.  Neurological: She is alert.  Skin: Skin is warm and dry.    ED Course  Procedures (including critical care time) Labs Review Labs Reviewed  WET PREP, GENITAL - Abnormal; Notable for the following:    Clue Cells Wet Prep HPF POC TOO NUMEROUS TO COUNT (*)    WBC, Wet Prep HPF POC FEW (*)    All other components within normal limits  POC URINE PREG, ED - Abnormal; Notable for the following:    Preg Test, Ur POSITIVE (*)    All other components  within normal limits  GC/CHLAMYDIA PROBE AMP  URINALYSIS, ROUTINE W REFLEX MICROSCOPIC  RPR  HIV ANTIBODY (ROUTINE TESTING)  I-STAT BETA HCG BLOOD, ED ([facility], [facility], [facility] ONLY)    Imaging Review [place] Ob Comp Less 14 Wks  05/13/2014   CLINICAL DATA:  Pregnancy with complication.  Vaginal bleeding.  EXAM: TRANSVAGINAL OB ULTRASOUND; OBSTETRIC <14 WK ULTRASOUND  TECHNIQUE: Transvaginal ultrasound was performed for complete evaluation of the gestation as well as the maternal uterus, adnexal regions, and pelvic cul-de-sac.  COMPARISON:  None.  FINDINGS: Intrauterine gestational sac: Visualized/normal in shape.   Yolk sac:  Visualized.  Embryo:  Visualized.  Cardiac Activity: Visualized.  Heart Rate: 132 bpm  CRL:   7.2  mm   6 w 4 d                  US EDC: January 02, 2015.  Maternal uterus/adnexae: 3.4 cm fibroid is noted in the uterus. 1 cm complex cyst is noted in right ovary. 1.6 cm complex cyst is noted in left ovary.  IMPRESSION: Single live intrauterine gestation of 6 weeks 4 days.   Electronically Signed   By: Roque LiasJames  Green M.D.   On: 05/13/2014 11:37   [place] Ob Transvaginal  05/13/2014   CLINICAL DATA:  Pregnancy with complication.  Vaginal bleeding.  EXAM: TRANSVAGINAL OB ULTRASOUND; OBSTETRIC <14 WK ULTRASOUND  TECHNIQUE: Transvaginal ultrasound was performed for complete evaluation of the gestation as well as the maternal uterus, adnexal regions, and pelvic cul-de-sac.  COMPARISON:  None.  FINDINGS: Intrauterine gestational sac: Visualized/normal in shape.  Yolk sac:  Visualized.  Embryo:  Visualized.  Cardiac Activity: Visualized.  Heart Rate: 132 bpm  CRL:   7.2  mm   6 w 4 d                  US EDC: January 02, 2015.  Maternal uterus/adnexae: 3.4 cm fibroid is noted in the uterus. 1 cm complex cyst is noted in right ovary. 1.6 cm complex cyst is noted in left ovary.  IMPRESSION: Single live intrauterine gestation of 6 weeks 4 days.   Electronically Signed   By: Roque LiasJames  Green M.D.   On: 05/13/2014 11:37     EKG Interpretation None      MDM   Final diagnoses:  Knee pain, right  Threatened miscarriage in early pregnancy  BV (bacterial vaginosis)    The patient is having a threatened abortion, single IUP confirmed on US. The nurse went back into to draw POC hcg quant when the pt felt disrespected and became hostile. Screaming at the nurse. Security ended up needing to be called and the patient was able to be calmed down and redirected. She asked to be discharged and does not want to wait for anymore of her tests to result.  She has BV, will treat with metronidazole. Pt makes it clear she does not want  the baby and wishes the baby would have already aborted. [facility]. GC, RPR, HIV still pending. She is hemodynamically stable- currently at risk of loosing her child. No cbc initially drawn but pt now refusing.  28 y.o.Jessica Walton's evaluation in the Emergency Department is complete.  The patient/guardian have been advised of the diagnosis and plan. We have discussed signs and symptoms that warrant return to the ED, such as changes or worsening in symptoms.  Vital signs are stable at discharge. Filed Vitals:   05/13/14 1201  BP: 130/80  Pulse: 73  Temp:   Resp: 16    Patient/guardian has voiced understanding and agreed to follow-up with the PCP or specialist.     Dorthula Matas, PA-C 05/13/14 1204

## 2014-05-13 NOTE — ED Notes (Signed)
Patient refusing blood work Patient wants to be DC Will make EDP aware

## 2014-05-14 LAB — GC/CHLAMYDIA PROBE AMP
CT Probe RNA: NEGATIVE
GC Probe RNA: NEGATIVE

## 2014-05-26 ENCOUNTER — Encounter (HOSPITAL_COMMUNITY): Payer: Self-pay | Admitting: Emergency Medicine

## 2014-09-20 ENCOUNTER — Encounter (HOSPITAL_COMMUNITY): Payer: Self-pay | Admitting: Emergency Medicine

## 2014-09-20 ENCOUNTER — Emergency Department (HOSPITAL_COMMUNITY)
Admission: EM | Admit: 2014-09-20 | Discharge: 2014-09-20 | Disposition: A | Discharge status: Home / Self Care | Payer: Medicaid Other | Attending: Emergency Medicine | Admitting: Emergency Medicine

## 2014-09-20 DIAGNOSIS — Z720 Tobacco use: Secondary | ICD-10-CM | POA: Insufficient documentation

## 2014-09-20 DIAGNOSIS — J45909 Unspecified asthma, uncomplicated: Secondary | ICD-10-CM | POA: Diagnosis not present

## 2014-09-20 DIAGNOSIS — M4050 Lordosis, unspecified, site unspecified: Secondary | ICD-10-CM | POA: Insufficient documentation

## 2014-09-20 DIAGNOSIS — I10 Essential (primary) hypertension: Secondary | ICD-10-CM | POA: Diagnosis not present

## 2014-09-20 DIAGNOSIS — J029 Acute pharyngitis, unspecified: Secondary | ICD-10-CM | POA: Diagnosis not present

## 2014-09-20 DIAGNOSIS — R51 Headache: Secondary | ICD-10-CM | POA: Diagnosis present

## 2014-09-20 DIAGNOSIS — Z792 Long term (current) use of antibiotics: Secondary | ICD-10-CM | POA: Diagnosis not present

## 2014-09-20 DIAGNOSIS — J0100 Acute maxillary sinusitis, unspecified: Secondary | ICD-10-CM | POA: Insufficient documentation

## 2014-09-20 LAB — RAPID STREP SCREEN (MED CTR MEBANE ONLY): Streptococcus, Group A Screen (Direct): NEGATIVE

## 2014-09-20 MED ORDER — IBUPROFEN 600 MG PO TABS: 600.0000 mg | ORAL_TABLET | Freq: Four times a day (QID) | ORAL | Status: DC | PRN

## 2014-09-20 MED ORDER — SALINE SPRAY 0.65 % NA SOLN: 1.0000 | NASAL | Status: DC | PRN

## 2014-09-20 MED ORDER — DEXAMETHASONE SODIUM PHOSPHATE 10 MG/ML IJ SOLN
10.0000 mg | Freq: Once | INTRAMUSCULAR | Status: AC
  Administered 2014-09-20: 10 mg via INTRAMUSCULAR
  Filled 2014-09-20: qty 1

## 2014-09-20 MED ORDER — AMOXICILLIN-POT CLAVULANATE 875-125 MG PO TABS: 1.0000 | ORAL_TABLET | Freq: Two times a day (BID) | ORAL | Status: DC

## 2014-09-20 MED ORDER — HYDROCODONE-ACETAMINOPHEN 5-325 MG PO TABS
2.0000 | ORAL_TABLET | Freq: Once | ORAL | Status: AC
  Administered 2014-09-20: 2 via ORAL
  Filled 2014-09-20: qty 2

## 2014-09-20 MED ORDER — IBUPROFEN 800 MG PO TABS: 800.0000 mg | ORAL_TABLET | Freq: Once | ORAL | Status: DC

## 2014-09-20 NOTE — Discharge Instructions (Signed)
Sinusitis °Sinusitis is redness, soreness, and inflammation of the paranasal sinuses. Paranasal sinuses are air pockets within the bones of your face (beneath the eyes, the middle of the forehead, or above the eyes). In healthy paranasal sinuses, mucus is able to drain out, and air is able to circulate through them by way of your nose. However, when your paranasal sinuses are inflamed, mucus and air can become trapped. This can allow bacteria and other germs to grow and cause infection. °Sinusitis can develop quickly and last only a short time (acute) or continue over a long period (chronic). Sinusitis that lasts for more than 12 weeks is considered chronic.  °CAUSES  °Causes of sinusitis include: °· Allergies. °· Structural abnormalities, such as displacement of the cartilage that separates your nostrils (deviated septum), which can decrease the air flow through your nose and sinuses and affect sinus drainage. °· Functional abnormalities, such as when the small hairs (cilia) that line your sinuses and help remove mucus do not work properly or are not present. °SIGNS AND SYMPTOMS  °Symptoms of acute and chronic sinusitis are the same. The primary symptoms are pain and pressure around the affected sinuses. Other symptoms include: °· Upper toothache. °· Earache. °· Headache. °· Bad breath. °· Decreased sense of smell and taste. °· A cough, which worsens when you are lying flat. °· Fatigue. °· Fever. °· Thick drainage from your nose, which often is green and may contain pus (purulent). °· Swelling and warmth over the affected sinuses. °DIAGNOSIS  °Your health care provider will perform a physical exam. During the exam, your health care provider may: °· Look in your nose for signs of abnormal growths in your nostrils (nasal polyps). °· Tap over the affected sinus to check for signs of infection. °· View the inside of your sinuses (endoscopy) using an imaging device that has a light attached (endoscope). °If your health  care provider suspects that you have chronic sinusitis, one or more of the following tests may be recommended: °· Allergy tests. °· Nasal culture. A sample of mucus is taken from your nose, sent to a lab, and screened for bacteria. °· Nasal cytology. A sample of mucus is taken from your nose and examined by your health care provider to determine if your sinusitis is related to an allergy. °TREATMENT  °Most cases of acute sinusitis are related to a viral infection and will resolve on their own within 10 days. Sometimes medicines are prescribed to help relieve symptoms (pain medicine, decongestants, nasal steroid sprays, or saline sprays).  °However, for sinusitis related to a bacterial infection, your health care provider will prescribe antibiotic medicines. These are medicines that will help kill the bacteria causing the infection.  °Rarely, sinusitis is caused by a fungal infection. In theses cases, your health care provider will prescribe antifungal medicine. °For some cases of chronic sinusitis, surgery is needed. Generally, these are cases in which sinusitis recurs more than 3 times per year, despite other treatments. °HOME CARE INSTRUCTIONS  °· Drink plenty of water. Water helps thin the mucus so your sinuses can drain more easily. °· Use a humidifier. °· Inhale steam 3 to 4 times a day (for example, sit in the bathroom with the shower running). °· Apply a warm, moist washcloth to your face 3 to 4 times a day, or as directed by your health care provider. °· Use saline nasal sprays to help moisten and clean your sinuses. °· Take medicines only as directed by your health care provider. °·   If you were prescribed either an antibiotic or antifungal medicine, finish it all even if you start to feel better. °SEEK IMMEDIATE MEDICAL CARE IF: °· You have increasing pain or severe headaches. °· You have nausea, vomiting, or drowsiness. °· You have swelling around your face. °· You have vision problems. °· You have a stiff  neck. °· You have difficulty breathing. °MAKE SURE YOU:  °· Understand these instructions. °· Will watch your condition. °· Will get help right away if you are not doing well or get worse. °Document Released: 07/11/2005 Document Revised: 11/25/2013 Document Reviewed: 07/26/2011 °ExitCare® Patient Information ©2015 [employer]. This information is not intended to replace advice given to you by your health care provider. Make sure you discuss any questions you have with your health care provider. ° °Upper Respiratory Infection, Adult °An upper respiratory infection (URI) is also sometimes known as the common cold. The upper respiratory tract includes the nose, sinuses, throat, trachea, and bronchi. Bronchi are the airways leading to the lungs. Most people improve within 1 week, but symptoms can last up to 2 weeks. A residual cough may last even longer.  °CAUSES °Many different viruses can infect the tissues lining the upper respiratory tract. The tissues become irritated and inflamed and often become very moist. Mucus production is also common. A cold is contagious. You can easily spread the virus to others by oral contact. This includes kissing, sharing a glass, coughing, or sneezing. Touching your mouth or nose and then touching a surface, which is then touched by another person, can also spread the virus. °SYMPTOMS  °Symptoms typically develop 1 to 3 days after you come in contact with a cold virus. Symptoms vary from person to person. They may include: °· Runny nose. °· Sneezing. °· Nasal congestion. °· Sinus irritation. °· Sore throat. °· Loss of voice (laryngitis). °· Cough. °· Fatigue. °· Muscle aches. °· Loss of appetite. °· Headache. °· Low-grade fever. °DIAGNOSIS  °You might diagnose your own cold based on familiar symptoms, since most people get a cold 2 to 3 times a year. Your caregiver can confirm this based on your exam. Most importantly, your caregiver can check that your symptoms are not due to  another disease such as strep throat, sinusitis, pneumonia, asthma, or epiglottitis. Blood tests, throat tests, and X-rays are not necessary to diagnose a common cold, but they may sometimes be helpful in excluding other more serious diseases. Your caregiver will decide if any further tests are required. °RISKS AND COMPLICATIONS  °You may be at risk for a more severe case of the common cold if you smoke cigarettes, have chronic heart disease (such as heart failure) or lung disease (such as asthma), or if you have a weakened immune system. The very young and very old are also at risk for more serious infections. Bacterial sinusitis, middle ear infections, and bacterial pneumonia can complicate the common cold. The common cold can worsen asthma and chronic obstructive pulmonary disease (COPD). Sometimes, these complications can require emergency medical care and may be life-threatening. °PREVENTION  °The best way to protect against getting a cold is to practice good hygiene. Avoid oral or hand contact with people with cold symptoms. Wash your hands often if contact occurs. There is no clear evidence that vitamin C, vitamin E, echinacea, or exercise reduces the chance of developing a cold. However, it is always recommended to get plenty of rest and practice good nutrition. °TREATMENT  °Treatment is directed at relieving symptoms. There is no   cure. Antibiotics are not effective, because the infection is caused by a virus, not by bacteria. Treatment may include: °· Increased fluid intake. Sports drinks offer valuable electrolytes, sugars, and fluids. °· Breathing heated mist or steam (vaporizer or shower). °· Eating chicken soup or other clear broths, and maintaining good nutrition. °· Getting plenty of rest. °· Using gargles or lozenges for comfort. °· Controlling fevers with ibuprofen or acetaminophen as directed by your caregiver. °· Increasing usage of your inhaler if you have asthma. °Zinc gel and zinc lozenges,  taken in the first 24 hours of the common cold, can shorten the duration and lessen the severity of symptoms. Pain medicines may help with fever, muscle aches, and throat pain. A variety of non-prescription medicines are available to treat congestion and runny nose. Your caregiver can make recommendations and may suggest nasal or lung inhalers for other symptoms.  °HOME CARE INSTRUCTIONS  °· Only take over-the-counter or prescription medicines for pain, discomfort, or fever as directed by your caregiver. °· Use a warm mist humidifier or inhale steam from a shower to increase air moisture. This may keep secretions moist and make it easier to breathe. °· Drink enough water and fluids to keep your urine clear or pale yellow. °· Rest as needed. °· Return to work when your temperature has returned to normal or as your caregiver advises. You may need to stay home longer to avoid infecting others. You can also use a face mask and careful hand washing to prevent spread of the virus. °SEEK MEDICAL CARE IF:  °· After the first few days, you feel you are getting worse rather than better. °· You need your caregiver's advice about medicines to control symptoms. °· You develop chills, worsening shortness of breath, or brown or red sputum. These may be signs of pneumonia. °· You develop yellow or brown nasal discharge or pain in the face, especially when you bend forward. These may be signs of sinusitis. °· You develop a fever, swollen neck glands, pain with swallowing, or white areas in the back of your throat. These may be signs of strep throat. °SEEK IMMEDIATE MEDICAL CARE IF:  °· You have a fever. °· You develop severe or persistent headache, ear pain, sinus pain, or chest pain. °· You develop wheezing, a prolonged cough, cough up blood, or have a change in your usual mucus (if you have chronic lung disease). °· You develop sore muscles or a stiff neck. °Document Released: 01/04/2001 Document Revised: 10/03/2011 Document  Reviewed: 10/16/2013 °ExitCare® Patient Information ©2015 [employer]. This information is not intended to replace advice given to you by your health care provider. Make sure you discuss any questions you have with your health care provider. ° °

## 2014-09-20 NOTE — ED Provider Notes (Signed)
CSN: [id number]     Arrival date & time 09/20/14  1300 History   This chart was scribed for non-physician practitioner Jinny SandersJoseph Jaun Galluzzo, PA-C working with No att. providers found by Conchita ParisNadim Abuhashem, ED Scribe. This patient was seen in [facility] and the patient's care was started at 2:04 PM.   Chief Complaint  Patient presents with  . Sore Throat  . Cough  . Headache    x 3 weeks   Patient is a 29 y.o. female presenting with pharyngitis, cough, and headaches. The history is provided by the patient. No language interpreter was used.  Sore Throat Pertinent negatives include no headaches.  Cough Associated symptoms: chills, rhinorrhea and sore throat   Associated symptoms: no fever and no headaches   Headache Associated symptoms: cough, sinus pressure and sore throat   Associated symptoms: no fever     HPI Comments: Jessica Walton is a 29 y.o. female who presents to the Emergency Department complaining of sore throat for 3 weeks, described as a throat is closing. Pt has trouble swallowing, HA which she describes as facial pressure, cough, rhinorrhea, sinus congestion, chills as associated symptoms. Pt is producing a green sputum. Patient denies inability to tolerate secretions, shortness of breath, blurred vision, dizziness, weakness, chest pain. She as also tried robitussin, motrin and tylenol which has not provided any relief. She denies fever. No chance of pregnancy.  Past Medical History  Diagnosis Date  . Asthma     No inhaler use x 1 yr (2013)  . Lordosis   . Hypertension    Past Surgical History  Procedure Laterality Date  . Eye surgery    . Wisdom tooth extraction     Family History  Problem Relation Age of Onset  . Hypertension Maternal Grandmother   . Diabetes Maternal Grandmother    History  Substance Use Topics  . Smoking status: Current Some Day Smoker -- 1.00 packs/day for 10 years    Types: Cigarettes  . Smokeless tobacco: Never Used  . Alcohol Use: 0.0 oz/week    OB History    Gravida Para Term Preterm AB TAB SAB Ectopic Multiple Living   4 2 1 1 2 1 1  0 0 1     Review of Systems  Constitutional: Positive for chills. Negative for fever.  HENT: Positive for rhinorrhea, sinus pressure, sneezing, sore throat and trouble swallowing.   Respiratory: Positive for cough.   Neurological: Negative for headaches.   Allergies  Review of patient's allergies indicates no known allergies.  Home Medications   Prior to Admission medications   Medication Sig Start Date End Date Taking? Authorizing Provider  dextromethorphan 15 MG/5ML syrup Take 10 mLs by mouth 4 (four) times daily as needed for cough.   Yes Historical Provider, MD  ALPRAZolam Prudy Feeler(XANAX) 1 MG tablet  09/10/14   Historical Provider, MD  amoxicillin-clavulanate (AUGMENTIN) 875-125 MG per tablet Take 1 tablet by mouth 2 (two) times daily. One po bid x 7 days 09/20/14   Monte FantasiaJoseph W Kalasia Crafton, PA-C  HYDROcodone-acetaminophen [facility](NORCO) 10-325 MG per tablet Take 1 tablet by mouth 3 (three) times daily as needed. 09/10/14   Historical Provider, MD  ibuprofen (ADVIL,MOTRIN) 200 MG tablet Take 200 mg by mouth every 6 (six) hours as needed for moderate pain.    Historical Provider, MD  ibuprofen (ADVIL,MOTRIN) 600 MG tablet Take 1 tablet (600 mg total) by mouth every 6 (six) hours as needed. 09/20/14   Monte FantasiaJoseph W Gabriele Zwilling, PA-C  metroNIDAZOLE (FLAGYL) 500 MG  tablet Take 1 tablet (500 mg total) by mouth 2 (two) times daily. 05/13/14   Tiffany Irine Seal, PA-C  sodium chloride (OCEAN) 0.65 % SOLN nasal spray Place 1 spray into both nostrils as needed for congestion. 09/20/14   Monte Fantasia, PA-C   BP 131/86 mmHg  Pulse 98  Temp(Src) 98.4 F (36.9 C) (Oral)  Resp 20  Wt 168 lb 2 oz (76.261 kg)  SpO2 99%  LMP 09/18/2014 Physical Exam  Constitutional: She is oriented to person, place, and time. She appears well-developed and well-nourished. No distress.  HENT:  Head: Normocephalic and atraumatic.  Nose: Mucosal edema  present.  Mouth/Throat: Uvula is midline and mucous membranes are normal. No oral lesions. No trismus in the jaw. No dental abscesses or uvula swelling. Posterior oropharyngeal erythema present. No oropharyngeal exudate, posterior oropharyngeal edema or tonsillar abscesses.  TMs clear bilaterally. Nasal turbinates erythematous bilaterally and slightly edematous. Mild erythema noted to posterior oropharynx. No tonsillar swelling. Uvula midline. No trismus.  Eyes: Pupils are equal, round, and reactive to light.  Neck: Normal range of motion.  Cardiovascular: Normal rate and regular rhythm.   Pulmonary/Chest: Effort normal. No respiratory distress.  Musculoskeletal: Normal range of motion.  Neurological: She is alert and oriented to person, place, and time.  Skin: Skin is warm and dry.  Psychiatric: She has a normal mood and affect. Her behavior is normal.  Nursing note and vitals reviewed.   ED Course  Procedures  DIAGNOSTIC STUDIES: Oxygen Saturation is 100% on room air, normal by my interpretation.    COORDINATION OF CARE: 2:14 PM Discussed treatment plan with pt at bedside and pt agreed to plan.  Labs Review Labs Reviewed  RAPID STREP SCREEN  CULTURE, GROUP A STREP   Imaging Review No results found.   EKG Interpretation None      MDM   Final diagnoses:  Acute maxillary sinusitis, recurrence not specified   Patient complaining of symptoms of sinusitis.    Severe symptoms have been present for greater than 10 days with purulent nasal discharge and maxillary sinus pain.  Concern for acute bacterial rhinosinusitis.  Patient discharged with Augmentin.  Instructions given for warm saline nasal wash and recommendations for follow-up with primary care physician.  Patient given Decadron in the ED for throat pain and swelling. No concern for PTA or retropharyngeal abscess. Patient able to tolerate secretions, tolerating fluids well in the ED, no signs of trismus. Patient noted to  have mildly erythematous posterior oropharynx. Strep test negative today. I discussed return for cautions with patient, patient verbalizes understanding and agreement with this plan. I encouraged patient to call or return to the ER should she have any questions or concerns.  I personally performed the services described in this documentation, which was scribed in my presence. The recorded information has been reviewed and is accurate.  BP 131/86 mmHg  Pulse 98  Temp(Src) 98.4 F (36.9 C) (Oral)  Resp 20  Wt 168 lb 2 oz (76.261 kg)  SpO2 99%  LMP 09/18/2014  Signed,  Ladona Mow, PA-C 10:30 PM     Monte Fantasia, PA-C 09/20/14 2230  Benny Lennert, MD 09/21/14 [id number]

## 2014-09-20 NOTE — ED Notes (Signed)
Pt reports 3 week hx of cough, sore throat, headache. Used OTC medicine with poor results. Chills x 1 day. Took Robitussin yesterday

## 2014-09-22 LAB — CULTURE, GROUP A STREP: STREP A CULTURE: POSITIVE — AB

## 2014-09-30 ENCOUNTER — Telehealth (HOSPITAL_BASED_OUTPATIENT_CLINIC_OR_DEPARTMENT_OTHER): Payer: Self-pay | Admitting: Emergency Medicine

## 2014-09-30 NOTE — Telephone Encounter (Signed)
Post ED Visit - Positive Culture Follow-up  Culture report reviewed by antimicrobial stewardship pharmacist: []  Wes Dulaney, Pharm.D., BCPS [x]  Celedonio MiyamotoJeremy Frens, Pharm.D., BCPS []  Georgina PillionElizabeth Martin, [address] BCPS []  WalnutMinh Pham, [address]Ph[address] BCPS, AAHIVP []  Estella HuskMichelle Turner, Pharm.D., BCPS, AAHIVP []  Elder CyphersLorie Poole, [address] BCPS  Positive strep culture Treated with augmentin, organism sensitive to the same and no further patient follow-up is required at this time.  Berle MullMiller, Tarren Sabree 09/30/2014, 2:49 PM

## 2014-10-27 ENCOUNTER — Emergency Department (HOSPITAL_COMMUNITY)
Admission: EM | Admit: 2014-10-27 | Discharge: 2014-10-27 | Disposition: A | Discharge status: Home / Self Care | Payer: Medicaid Other | Attending: Emergency Medicine | Admitting: Emergency Medicine

## 2014-10-27 ENCOUNTER — Emergency Department (HOSPITAL_COMMUNITY): Payer: Medicaid Other

## 2014-10-27 ENCOUNTER — Encounter (HOSPITAL_COMMUNITY): Payer: Self-pay | Admitting: Emergency Medicine

## 2014-10-27 DIAGNOSIS — R05 Cough: Secondary | ICD-10-CM | POA: Diagnosis present

## 2014-10-27 DIAGNOSIS — Z3202 Encounter for pregnancy test, result negative: Secondary | ICD-10-CM | POA: Insufficient documentation

## 2014-10-27 DIAGNOSIS — Z720 Tobacco use: Secondary | ICD-10-CM | POA: Diagnosis not present

## 2014-10-27 DIAGNOSIS — Z79899 Other long term (current) drug therapy: Secondary | ICD-10-CM | POA: Diagnosis not present

## 2014-10-27 DIAGNOSIS — R0982 Postnasal drip: Secondary | ICD-10-CM

## 2014-10-27 DIAGNOSIS — R059 Cough, unspecified: Secondary | ICD-10-CM

## 2014-10-27 DIAGNOSIS — I10 Essential (primary) hypertension: Secondary | ICD-10-CM | POA: Insufficient documentation

## 2014-10-27 DIAGNOSIS — Z792 Long term (current) use of antibiotics: Secondary | ICD-10-CM | POA: Diagnosis not present

## 2014-10-27 DIAGNOSIS — J309 Allergic rhinitis, unspecified: Secondary | ICD-10-CM

## 2014-10-27 DIAGNOSIS — J029 Acute pharyngitis, unspecified: Secondary | ICD-10-CM | POA: Diagnosis not present

## 2014-10-27 DIAGNOSIS — J45909 Unspecified asthma, uncomplicated: Secondary | ICD-10-CM | POA: Insufficient documentation

## 2014-10-27 LAB — POC URINE PREG, ED: PREG TEST UR: NEGATIVE

## 2014-10-27 MED ORDER — FEXOFENADINE HCL 60 MG PO TABS: 60.0000 mg | ORAL_TABLET | Freq: Two times a day (BID) | ORAL | Status: DC

## 2014-10-27 MED ORDER — AEROCHAMBER PLUS FLO-VU LARGE MISC
1.0000 | Status: AC
  Administered 2014-10-27: 1
  Filled 2014-10-27 (×2): qty 1

## 2014-10-27 MED ORDER — ALBUTEROL SULFATE HFA 108 (90 BASE) MCG/ACT IN AERS
4.0000 | INHALATION_SPRAY | RESPIRATORY_TRACT | Status: AC
  Administered 2014-10-27: 4 via RESPIRATORY_TRACT
  Filled 2014-10-27: qty 6.7

## 2014-10-27 MED ORDER — TRAMADOL HCL 50 MG PO TABS: 50.0000 mg | ORAL_TABLET | Freq: Four times a day (QID) | ORAL | Status: DC | PRN

## 2014-10-27 MED ORDER — ACETAMINOPHEN 325 MG PO TABS
650.0000 mg | ORAL_TABLET | Freq: Once | ORAL | Status: DC
  Filled 2014-10-27: qty 2

## 2014-10-27 NOTE — ED Notes (Signed)
Explained AVS to paperwork and to follow up with ENT or otolaryngology. Pt was appropriate and calm. Explained appropriate antibiotic uses to patient concerning symptoms and use for Allegra for current symptoms. Refused vital signs. Ambulatory. RR even/unlabored. No cough noted.

## 2014-10-27 NOTE — ED Notes (Signed)
Pt c/o headache, chest pain, and throat pain x 1 month. Pt states she was seen for the same 1 month ago, prescribed antibiotics, and that pain has not resolved.

## 2014-10-27 NOTE — ED Notes (Addendum)
Pt up to nursing station, screaming and yelling at this RN that she needs something for pain. Explained to pt that she will not be getting anything additional for pain as she has already refused the pain medication offered to her. Pt began to yell at this RN and cuss very loudly. [employer] and security called to bedside.

## 2014-10-27 NOTE — Discharge Instructions (Signed)
Allergic Rhinitis Allergic rhinitis is when the mucous membranes in the nose respond to allergens. Allergens are particles in the air that cause your body to have an allergic reaction. This causes you to release allergic antibodies. Through a chain of events, these eventually cause you to release histamine into the blood stream. Although meant to protect the body, it is this release of histamine that causes your discomfort, such as frequent sneezing, congestion, and an itchy, runny nose.  CAUSES  Seasonal allergic rhinitis (hay fever) is caused by pollen allergens that may come from grasses, trees, and weeds. Year-round allergic rhinitis (perennial allergic rhinitis) is caused by allergens such as house dust mites, pet dander, and mold spores.  SYMPTOMS   Nasal stuffiness (congestion).  Itchy, runny nose with sneezing and tearing of the eyes. DIAGNOSIS  Your health care provider can help you determine the allergen or allergens that trigger your symptoms. If you and your health care provider are unable to determine the allergen, skin or blood testing may be used. TREATMENT  Allergic rhinitis does not have a cure, but it can be controlled by:  Medicines and allergy shots (immunotherapy).  Avoiding the allergen. Hay fever may often be treated with antihistamines in pill or nasal spray forms. Antihistamines block the effects of histamine. There are over-the-counter medicines that may help with nasal congestion and swelling around the eyes. Check with your health care provider before taking or giving this medicine.  If avoiding the allergen or the medicine prescribed do not work, there are many new medicines your health care provider can prescribe. Stronger medicine may be used if initial measures are ineffective. Desensitizing injections can be used if medicine and avoidance does not work. Desensitization is when a patient is given ongoing shots until the body becomes less sensitive to the allergen.  Make sure you follow up with your health care provider if problems continue. HOME CARE INSTRUCTIONS It is not possible to completely avoid allergens, but you can reduce your symptoms by taking steps to limit your exposure to them. It helps to know exactly what you are allergic to so that you can avoid your specific triggers. SEEK MEDICAL CARE IF:   You have a fever.  You develop a cough that does not stop easily (persistent).  You have shortness of breath.  You start wheezing.  Symptoms interfere with normal daily activities. Document Released: 04/05/2001 Document Revised: 07/16/2013 Document Reviewed: 03/18/2013 [facility] CenterExit[facility] Patient Information 2015 [place], [place]. This information is not intended to replace advice given to you by your health care provider. Make sure you discuss any questions you have with your health care provider. With your history of asthma.  You've been provided with an inhaler.  Please uses as follows 2-4 puffs every 4-6 hours while awake for your cough.  Symptoms you've been given a prescription for Allegra for your rhinitis and allergy symptoms.  You've been given a prescription for Ultram for pain control since you state that ibuprofen and Tylenol do not work for you and you've been referred to Dr. Suszanne Connerseoh for further evaluation of your appears to be seasonal allergies

## 2014-10-27 NOTE — ED Provider Notes (Signed)
CSN: [id number]     Arrival date & time 10/27/14  1745 History   First MD Initiated Contact with Patient 10/27/14 1950     No chief complaint on file.  The history is provided by the patient. No language interpreter was used.   This chart was scribed for nurse practitioner Earley FavorGail Merly Hinkson, NP working with Rolland PorterMark James, MD, by Andrew Auaven Small, ED Scribe. This patient was seen in room WTR9/WTR9 and the patient's care was started at 7:55 PM.  Jessica Walton is a 29 y.o. female who presents to the Emergency Department complaining of throat pain onset 1 month. Pt states she was seen here a little over a month ago for similar complaint and was treated with an abx. She reports symptoms had never improved and has been taking OTC medication since visit without relief. Pt states she woke up this morning with hoarseness. She reports associated rhinorrhea, a dry cough, and chest tightness. Pt states she takes xanax 3 times daily prescribed by PCP at [facility]. she reports a f/u appointment in 2 days. Pt reports last menses was 09/20/14  Past Medical History  Diagnosis Date  . Asthma     No inhaler use x 1 yr (2013)  . Lordosis   . Hypertension    Past Surgical History  Procedure Laterality Date  . Eye surgery    . Wisdom tooth extraction     Family History  Problem Relation Age of Onset  . Hypertension Maternal Grandmother   . Diabetes Maternal Grandmother    History  Substance Use Topics  . Smoking status: Current Some Day Smoker -- 1.00 packs/day for 10 years    Types: Cigarettes  . Smokeless tobacco: Never Used  . Alcohol Use: 0.0 oz/week   OB History    Gravida Para Term Preterm AB TAB SAB Ectopic Multiple Living   4 2 1 1 2 1 1  0 0 1     Review of Systems  HENT: Positive for postnasal drip, rhinorrhea, sore throat and voice change.   Respiratory: Positive for cough and chest tightness. Negative for wheezing and stridor.   Cardiovascular: Positive for chest pain.  All other systems reviewed  and are negative.   Allergies  Review of patient's allergies indicates no known allergies.  Home Medications   Prior to Admission medications   Medication Sig Start Date End Date Taking? Authorizing Provider  ALPRAZolam Prudy Feeler(XANAX) 1 MG tablet  09/10/14   Historical Provider, MD  amoxicillin-clavulanate (AUGMENTIN) 875-125 MG per tablet Take 1 tablet by mouth 2 (two) times daily. One po bid x 7 days 09/20/14   Ladona MowJoe Mintz, PA-C  dextromethorphan 15 MG/5ML syrup Take 10 mLs by mouth 4 (four) times daily as needed for cough.    Historical Provider, MD  HYDROcodone-acetaminophen (NORCO) 10-325 MG per tablet Take 1 tablet by mouth 3 (three) times daily as needed. 09/10/14   Historical Provider, MD  ibuprofen (ADVIL,MOTRIN) 200 MG tablet Take 200 mg by mouth every 6 (six) hours as needed for moderate pain.    Historical Provider, MD  ibuprofen (ADVIL,MOTRIN) 600 MG tablet Take 1 tablet (600 mg total) by mouth every 6 (six) hours as needed. 09/20/14   Ladona MowJoe Mintz, PA-C  metroNIDAZOLE (FLAGYL) 500 MG tablet Take 1 tablet (500 mg total) by mouth 2 (two) times daily. 05/13/14   Tiffany Neva SeatGreene, PA-C  sodium chloride (OCEAN) 0.65 % SOLN nasal spray Place 1 spray into both nostrils as needed for congestion. 09/20/14   Ladona MowJoe Mintz, PA-C  BP 124/79 mmHg  Pulse 83  Temp(Src) 98.3 F (36.8 C) (Oral)  Resp 16  SpO2 100%  LMP 10/13/2014 Physical Exam  Constitutional: She is oriented to person, place, and time. She appears well-developed and well-nourished. No distress.  HENT:  Head: Normocephalic and atraumatic.  Mouth/Throat: Uvula is midline and oropharynx is clear and moist. No oropharyngeal exudate, posterior oropharyngeal edema or posterior oropharyngeal erythema.  Eyes: Conjunctivae and EOM are normal.  Neck: Neck supple.  Cardiovascular: Normal rate.   Pulmonary/Chest: Effort normal and breath sounds normal. She has no wheezes. She has no rales. She exhibits no tenderness.  Musculoskeletal: Normal range of  motion.  Neurological: She is alert and oriented to person, place, and time.  Skin: Skin is warm and dry.  Psychiatric: She has a normal mood and affect. Her behavior is normal.  Nursing note and vitals reviewed.   ED Course  Procedures (including critical care time) DIAGNOSTIC STUDIES: Oxygen Saturation is 100% on RA, normal by my interpretation.    COORDINATION OF CARE: 8:04 PM- Pt advised of plan for treatment and pt agrees.  Labs Review Labs Reviewed - No data to display  Imaging Review No results found.   EKG Interpretation None     patient refuses Tylenol, stating it [facility] to give her medications that don't work for her. x-ray is normal  MDM   Final diagnoses:  None    I personally performed the services described in this documentation, which was scribed in my presence. The recorded information has been reviewed and is accurate.   Earley Favor, NP 10/27/14 2207  Rolland Porter, MD 11/01/14 [phone]

## 2014-11-03 ENCOUNTER — Emergency Department (HOSPITAL_COMMUNITY)
Admission: EM | Admit: 2014-11-03 | Discharge: 2014-11-03 | Disposition: A | Discharge status: Home / Self Care | Payer: Medicaid Other | Attending: Emergency Medicine | Admitting: Emergency Medicine

## 2014-11-03 ENCOUNTER — Encounter (HOSPITAL_COMMUNITY): Payer: Self-pay | Admitting: *Deleted

## 2014-11-03 DIAGNOSIS — T402X5A Adverse effect of other opioids, initial encounter: Secondary | ICD-10-CM

## 2014-11-03 DIAGNOSIS — J45909 Unspecified asthma, uncomplicated: Secondary | ICD-10-CM | POA: Diagnosis not present

## 2014-11-03 DIAGNOSIS — K921 Melena: Secondary | ICD-10-CM | POA: Diagnosis not present

## 2014-11-03 DIAGNOSIS — K625 Hemorrhage of anus and rectum: Secondary | ICD-10-CM | POA: Diagnosis present

## 2014-11-03 DIAGNOSIS — F419 Anxiety disorder, unspecified: Secondary | ICD-10-CM | POA: Diagnosis not present

## 2014-11-03 DIAGNOSIS — I10 Essential (primary) hypertension: Secondary | ICD-10-CM | POA: Diagnosis not present

## 2014-11-03 DIAGNOSIS — K5909 Other constipation: Secondary | ICD-10-CM | POA: Insufficient documentation

## 2014-11-03 DIAGNOSIS — Z79899 Other long term (current) drug therapy: Secondary | ICD-10-CM | POA: Diagnosis not present

## 2014-11-03 DIAGNOSIS — Z720 Tobacco use: Secondary | ICD-10-CM | POA: Diagnosis not present

## 2014-11-03 DIAGNOSIS — K5903 Drug induced constipation: Secondary | ICD-10-CM

## 2014-11-03 DIAGNOSIS — IMO0001 Reserved for inherently not codable concepts without codable children: Secondary | ICD-10-CM

## 2014-11-03 LAB — I-STAT CHEM 8, ED
BUN: 11 mg/dL (ref 6–23)
CHLORIDE: 102 mmol/L (ref 96–112)
Calcium, Ion: 1.19 mmol/L (ref 1.12–1.23)
Creatinine, Ser: 0.8 mg/dL (ref 0.50–1.10)
Glucose, Bld: 90 mg/dL (ref 70–99)
HCT: 38 % (ref 36.0–46.0)
HEMOGLOBIN: 12.9 g/dL (ref 12.0–15.0)
POTASSIUM: 3.2 mmol/L — AB (ref 3.5–5.1)
Sodium: 141 mmol/L (ref 135–145)
TCO2: 21 mmol/L (ref 0–100)

## 2014-11-03 LAB — POC OCCULT BLOOD, ED: FECAL OCCULT BLD: POSITIVE — AB

## 2014-11-03 MED ORDER — LACTULOSE 10 GM/15ML PO SOLN
10.0000 g | Freq: Once | ORAL | Status: AC
  Administered 2014-11-03: 10 g via ORAL
  Filled 2014-11-03: qty 15

## 2014-11-03 MED ORDER — LACTULOSE 10 GM/15ML PO SOLN: 10.0000 g | Freq: Two times a day (BID) | ORAL | Status: DC | PRN

## 2014-11-03 MED ORDER — FLEET ENEMA 7-19 GM/118ML RE ENEM
1.0000 | ENEMA | Freq: Once | RECTAL | Status: AC
  Administered 2014-11-03: 1 via RECTAL
  Filled 2014-11-03: qty 1

## 2014-11-03 NOTE — ED Notes (Addendum)
Pt reports constipation x3 days, pt is only passing blood clots, denies anal sex, injury or trauma. Reports anal pressure and pain 10/10. Reports menstrual cycle started 3 days ago as well.

## 2014-11-03 NOTE — ED Notes (Signed)
POS Blood [place]clut reported to RN S. Gentry FitzBingham and Dr Jodi MourningZavitz

## 2014-11-03 NOTE — Discharge Instructions (Signed)
Continue to drink plenty of water, increase fiber in your diet with increased vegetables, decrease use of narcotics and benzos with assistance from your doctor's.  If you were given medicines take as directed.  If you are on coumadin or contraceptives realize their levels and effectiveness is altered by many different medicines.  If you have any reaction (rash, tongues swelling, other) to the medicines stop taking and see a physician.   Please follow up as directed and return to the ER or see a physician for new or worsening symptoms.  Thank you. Filed Vitals:   11/03/14 0820  BP: 131/96  Pulse: 77  Temp: 98.1 F (36.7 C)  TempSrc: Oral  Resp: 16  SpO2: 100%

## 2014-11-03 NOTE — ED Provider Notes (Signed)
CSN: [id number]     Arrival date & time 11/03/14  0813 History   First MD Initiated Contact with Patient 11/03/14 0825     Chief Complaint  Patient presents with  . Rectal Bleeding  . constipated      (Consider location/radiation/quality/duration/timing/severity/associated sxs/prior Treatment) HPI Comments: 29 year old female with history of high risk pregnancy, chronic pain on narcotics when necessary, anxiety since death of fianc on benzos  regularly presents with worsening constipation for last 3 days. No bowel movement since, mild bleeding and clots with trying to have a bowel movement. No syncope or abdominal pain or other symptoms. Patient is on her menstrual cycle. No history of colon issues or polyps.patient has an appointment psychiatrist this month for further management of her anxiety.   Patient is a 29 y.o. female presenting with hematochezia. The history is provided by the patient.  Rectal Bleeding Associated symptoms: no abdominal pain, no fever and no vomiting     Past Medical History  Diagnosis Date  . Asthma     No inhaler use x 1 yr (2013)  . Lordosis   . Hypertension    Past Surgical History  Procedure Laterality Date  . Eye surgery    . Wisdom tooth extraction     Family History  Problem Relation Age of Onset  . Hypertension Maternal Grandmother   . Diabetes Maternal Grandmother    History  Substance Use Topics  . Smoking status: Current Some Day Smoker -- 1.00 packs/day for 10 years    Types: Cigarettes  . Smokeless tobacco: Never Used  . Alcohol Use: 0.0 oz/week   OB History    Gravida Para Term Preterm AB TAB SAB Ectopic Multiple Living   4 2 1 1 2 1 1  0 0 1     Review of Systems  Constitutional: Negative for fever.  Gastrointestinal: Positive for constipation, blood in stool and hematochezia. Negative for vomiting and abdominal pain.  Genitourinary: Negative for dysuria.  Neurological: Negative for headaches.  Psychiatric/Behavioral: The  patient is nervous/anxious.       Allergies  Review of patient's allergies indicates no known allergies.  Home Medications   Prior to Admission medications   Medication Sig Start Date End Date Taking? Authorizing Provider  albuterol (PROVENTIL HFA;VENTOLIN HFA) 108 (90 BASE) MCG/ACT inhaler Inhale 2 puffs into the lungs every 6 (six) hours as needed for wheezing or shortness of breath.    Historical Provider, MD  ALPRAZolam Prudy Feeler(XANAX) 1 MG tablet Take 1 mg by mouth 3 (three) times daily as needed for anxiety.  09/10/14   Historical Provider, MD  amoxicillin-clavulanate (AUGMENTIN) 875-125 MG per tablet Take 1 tablet by mouth 2 (two) times daily. One po bid x 7 days Patient not taking: Reported on 10/27/2014 09/20/14   Ladona MowJoe Mintz, PA-C  dextromethorphan 15 MG/5ML syrup Take 10 mLs by mouth 4 (four) times daily as needed for cough.    Historical Provider, MD  fexofenadine (ALLEGRA) 60 MG tablet Take 1 tablet (60 mg total) by mouth 2 (two) times daily. 10/27/14   Earley FavorGail Schulz, NP  HYDROcodone-acetaminophen (NORCO) 10-325 MG per tablet Take 1 tablet by mouth 3 (three) times daily as needed. 09/10/14   Historical Provider, MD  ibuprofen (ADVIL,MOTRIN) 200 MG tablet Take 200 mg by mouth every 6 (six) hours as needed for moderate pain.    Historical Provider, MD  ibuprofen (ADVIL,MOTRIN) 600 MG tablet Take 1 tablet (600 mg total) by mouth every 6 (six) hours as needed. 09/20/14  Ladona Mow, PA-C  lactulose (CHRONULAC) 10 GM/15ML solution Take 15 mLs (10 g total) by mouth 2 (two) times daily as needed for moderate constipation. 11/03/14   Blane Ohara, MD  metroNIDAZOLE (FLAGYL) 500 MG tablet Take 1 tablet (500 mg total) by mouth 2 (two) times daily. Patient not taking: Reported on 10/27/2014 05/13/14   Marlon Pel, PA-C  sodium chloride (OCEAN) 0.65 % SOLN nasal spray Place 1 spray into both nostrils as needed for congestion. 09/20/14   Ladona Mow, PA-C  traMADol (ULTRAM) 50 MG tablet Take 1 tablet (50 mg total)  by mouth every 6 (six) hours as needed. 10/27/14   Earley Favor, NP   BP 154/98 mmHg  Pulse 70  Temp(Src) 98 F (36.7 C) (Oral)  Resp 16  SpO2 100%  LMP 10/10/2014 (Exact Date) Physical Exam  Constitutional: She appears well-developed and well-nourished. No distress.  HENT:  Head: Normocephalic and atraumatic.  Cardiovascular: Normal rate.   Pulmonary/Chest: Effort normal.  Abdominal: Soft. She exhibits no distension. There is no tenderness.  Genitourinary:  No fissures or external hemorrhoids appreciated, no gross blood per rectum. Normal tone.  Musculoskeletal: She exhibits no edema.  Nursing note and vitals reviewed.   ED Course  Procedures (including critical care time) Labs Review Labs Reviewed  I-STAT CHEM 8, ED - Abnormal; Notable for the following:    Potassium 3.2 (*)    All other components within normal limits  POC OCCULT BLOOD, ED - Abnormal; Notable for the following:    Fecal Occult Bld POSITIVE (*)    All other components within normal limits    Imaging Review No results found. Dg Chest 2 View  10/27/2014   CLINICAL DATA:  Patient with chest pain shortness of breath.  EXAM: CHEST  2 VIEW  COMPARISON:  02/27/2014  FINDINGS: Normal cardiac and mediastinal contours. No consolidative pulmonary opacities. No pleural effusion or pneumothorax. Regional skeleton is unremarkable.  IMPRESSION: No acute cardiopulmonary process.   Electronically Signed   By: Annia Belt M.D.   On: 10/27/2014 21:49    EKG Interpretation None      MDM   Final diagnoses:  Constipation due to opioid therapy  Blood stool   Patient presents with worsening constipation symptoms, patient is on mirilax without significant improvement. Patient has had these issues intermittent for 1 year, long discussion regarding secondary to use of narcotics. Discussed supportive care with change in diet, activity/fluids and when necessary use of stool softeners. Patient overall well-appearing, discussed  continued follow-up with primary doctor and psychiatrist to help decrease and eliminate use of benzos and narcotics.  Results and differential diagnosis were discussed with the patient/parent/guardian. Close follow up outpatient was discussed, comfortable with the plan.   Medications  sodium phosphate (FLEET) 7-19 GM/118ML enema 1 enema (1 enema Rectal Given 11/03/14 0853)  lactulose (CHRONULAC) 10 GM/15ML solution 10 g (10 g Oral Given 11/03/14 0854)    Filed Vitals:   11/03/14 0820 11/03/14 0928  BP: 131/96 154/98  Pulse: 77 70  Temp: 98.1 F (36.7 C) 98 F (36.7 C)  TempSrc: Oral Oral  Resp: 16   SpO2: 100% 100%    Final diagnoses:  Constipation due to opioid therapy  Blood stool    \    Blane Ohara, MD 11/07/14 1121

## 2014-11-09 ENCOUNTER — Encounter (HOSPITAL_COMMUNITY): Payer: Self-pay | Admitting: Emergency Medicine

## 2014-11-09 ENCOUNTER — Telehealth: Payer: Self-pay | Admitting: Neurology

## 2014-11-09 ENCOUNTER — Emergency Department (HOSPITAL_COMMUNITY): Payer: Medicaid Other

## 2014-11-09 ENCOUNTER — Emergency Department (HOSPITAL_COMMUNITY)
Admission: EM | Admit: 2014-11-09 | Discharge: 2014-11-09 | Disposition: A | Discharge status: Home / Self Care | Payer: Medicaid Other | Attending: Emergency Medicine | Admitting: Emergency Medicine

## 2014-11-09 DIAGNOSIS — Z3202 Encounter for pregnancy test, result negative: Secondary | ICD-10-CM | POA: Insufficient documentation

## 2014-11-09 DIAGNOSIS — J45909 Unspecified asthma, uncomplicated: Secondary | ICD-10-CM | POA: Insufficient documentation

## 2014-11-09 DIAGNOSIS — Z792 Long term (current) use of antibiotics: Secondary | ICD-10-CM | POA: Diagnosis not present

## 2014-11-09 DIAGNOSIS — R569 Unspecified convulsions: Secondary | ICD-10-CM

## 2014-11-09 DIAGNOSIS — F1310 Sedative, hypnotic or anxiolytic abuse, uncomplicated: Secondary | ICD-10-CM | POA: Diagnosis not present

## 2014-11-09 DIAGNOSIS — I10 Essential (primary) hypertension: Secondary | ICD-10-CM | POA: Diagnosis not present

## 2014-11-09 DIAGNOSIS — F1210 Cannabis abuse, uncomplicated: Secondary | ICD-10-CM | POA: Diagnosis not present

## 2014-11-09 DIAGNOSIS — Z720 Tobacco use: Secondary | ICD-10-CM | POA: Insufficient documentation

## 2014-11-09 DIAGNOSIS — R251 Tremor, unspecified: Secondary | ICD-10-CM | POA: Insufficient documentation

## 2014-11-09 LAB — BASIC METABOLIC PANEL
Anion gap: 11 (ref 5–15)
BUN: 11 mg/dL (ref 6–23)
CALCIUM: 9.2 mg/dL (ref 8.4–10.5)
CO2: 23 mmol/L (ref 19–32)
Chloride: 104 mmol/L (ref 96–112)
Creatinine, Ser: 1 mg/dL (ref 0.50–1.10)
GFR, EST AFRICAN AMERICAN: 89 mL/min — AB (ref >90)
GFR, EST NON AFRICAN AMERICAN: 76 mL/min — AB (ref >90)
Glucose, Bld: 104 mg/dL — ABNORMAL HIGH (ref 70–99)
Potassium: 3.3 mmol/L — ABNORMAL LOW (ref 3.5–5.1)
Sodium: 138 mmol/L (ref 135–145)

## 2014-11-09 LAB — PREGNANCY, URINE: PREG TEST UR: NEGATIVE

## 2014-11-09 LAB — CBC WITH DIFFERENTIAL/PLATELET
BASOS PCT: 0 % (ref 0–1)
Basophils Absolute: 0 10*3/uL (ref 0.0–0.1)
Eosinophils Absolute: 0 10*3/uL (ref 0.0–0.7)
Eosinophils Relative: 1 % (ref 0–5)
HEMATOCRIT: 39.1 % (ref 36.0–46.0)
Hemoglobin: 12.9 g/dL (ref 12.0–15.0)
LYMPHS ABS: 1.3 10*3/uL (ref 0.7–4.0)
Lymphocytes Relative: 23 % (ref 12–46)
MCH: 29.2 pg (ref 26.0–34.0)
MCHC: 33 g/dL (ref 30.0–36.0)
MCV: 88.5 fL (ref 78.0–100.0)
MONOS PCT: 5 % (ref 3–12)
Monocytes Absolute: 0.3 10*3/uL (ref 0.1–1.0)
Neutro Abs: 4.1 10*3/uL (ref 1.7–7.7)
Neutrophils Relative %: 71 % (ref 43–77)
PLATELETS: 317 10*3/uL (ref 150–400)
RBC: 4.42 MIL/uL (ref 3.87–5.11)
RDW: 14.8 % (ref 11.5–15.5)
WBC: 5.7 10*3/uL (ref 4.0–10.5)

## 2014-11-09 LAB — URINALYSIS, ROUTINE W REFLEX MICROSCOPIC
Bilirubin Urine: NEGATIVE
GLUCOSE, UA: NEGATIVE mg/dL
KETONES UR: NEGATIVE mg/dL
LEUKOCYTES UA: NEGATIVE
Nitrite: NEGATIVE
Protein, ur: 30 mg/dL — AB
Specific Gravity, Urine: 1.022 (ref 1.005–1.030)
UROBILINOGEN UA: 0.2 mg/dL (ref 0.0–1.0)
pH: 6 (ref 5.0–8.0)

## 2014-11-09 LAB — URINE MICROSCOPIC-ADD ON

## 2014-11-09 LAB — RAPID URINE DRUG SCREEN, HOSP PERFORMED
Amphetamines: NOT DETECTED
BARBITURATES: NOT DETECTED
BENZODIAZEPINES: POSITIVE — AB
Cocaine: NOT DETECTED
Opiates: POSITIVE — AB
Tetrahydrocannabinol: POSITIVE — AB

## 2014-11-09 LAB — ETHANOL: Alcohol, Ethyl (B): <5 mg/dL (ref 0–9)

## 2014-11-09 MED ORDER — IBUPROFEN 800 MG PO TABS
800.0000 mg | ORAL_TABLET | Freq: Once | ORAL | Status: AC
  Administered 2014-11-09: 800 mg via ORAL
  Filled 2014-11-09: qty 1

## 2014-11-09 NOTE — ED Notes (Signed)
Bed: [id number] Expected date: 11/09/14 Expected time: 11:43 AM Means of arrival: Ambulance Comments: Seizures

## 2014-11-09 NOTE — ED Notes (Signed)
Per EMS-states she had a 7 minute witnessed seizure by family which quit when EMS arrived on scene-does not appear to be post-ictal per EMS-patient has no history of seizure, neuro assessment negative-history of HTN, but not taking BP meds-on pain meds for headaches

## 2014-11-09 NOTE — Telephone Encounter (Signed)
[facility] on call physician was paged by the [facility]. Called back. This is not a patient we have seen in our practice. Was a mistake, they meant to page the neurohospitalist on call. Thanks.

## 2014-11-09 NOTE — ED Notes (Signed)
Off floor for testing 

## 2014-11-09 NOTE — Discharge Instructions (Signed)
Follow up with neurology as discussed. You are not allowed to drive until you have follow up. Seizure, Adult A seizure is abnormal electrical activity in the brain. Seizures usually last from 30 seconds to 2 minutes. There are various types of seizures. Before a seizure, you may have a warning sensation (aura) that a seizure is about to occur. An aura may include the following symptoms:   Fear or anxiety.  Nausea.  Feeling like the room is spinning (vertigo).  Vision changes, such as seeing flashing lights or spots. Common symptoms during a seizure include:  A change in attention or behavior (altered mental status).  Convulsions with rhythmic jerking movements.  Drooling.  Rapid eye movements.  Grunting.  Loss of bladder and bowel control.  Bitter taste in the mouth.  Tongue biting. After a seizure, you may feel confused and sleepy. You may also have an injury resulting from convulsions during the seizure. HOME CARE INSTRUCTIONS   If you are given medicines, take them exactly as prescribed by your health care provider.  Keep all follow-up appointments as directed by your health care provider.  Do not swim or drive or engage in risky activity during which a seizure could cause further injury to you or others until your health care provider says it is OK.  Get adequate rest.  Teach friends and family what to do if you have a seizure. They should:  Lay you on the ground to prevent a fall.  Put a cushion under your head.  Loosen any tight clothing around your neck.  Turn you on your side. If vomiting occurs, this helps keep your airway clear.  Stay with you until you recover.  Know whether or not you need emergency care. SEEK IMMEDIATE MEDICAL CARE IF:  The seizure lasts longer than 5 minutes.  The seizure is severe or you do not wake up immediately after the seizure.  You have an altered mental status after the seizure.  You are having more frequent or  worsening seizures. Someone should drive you to the emergency department or call local emergency services (911 in [place] MAKE SURE YOU:  Understand these instructions.  Will watch your condition.  Will get help right away if you are not doing well or get worse. Document Released: 07/08/2000 Document Revised: 05/01/2013 Document Reviewed: 02/20/2013 [facility] Patient Information 2015 [place]. This information is not intended to replace advice given to you by your health care provider. Make sure you discuss any questions you have with your health care provider.

## 2014-11-09 NOTE — ED Provider Notes (Signed)
CSN: [id number]     Arrival date & time 11/09/14  1153 History   First MD Initiated Contact with Patient 11/09/14 1215     No chief complaint on file.    (Consider location/radiation/quality/duration/timing/severity/associated sxs/prior Treatment) HPI Comments: 29 yo female with history of HTN, Asthma, Anxiety, and chronic pain presenting with reported seizure activity PTA.  No significant FHX or previous history of seizure.  Mother reports she was called to the living room by children and witnessed pt sitting in a recliner "having a seizure" described as full body shaking and stiffness of arm lasting 5-10 minutes. Further reports pt immediately sat up and asking what happened to her.  Denies urinary incontinence and states she bit her bottom lip.  She now has a headache associated with photsensitivity and dizziness described as the room spinning and feeling lightheaded. She has a history of migraines and take Vicodin.  This headache feels different from previous headaches.  Denies recent illness, fever, weakness or changes in speech.  Denies illicit substance abuse.  States she drinks ETOH on weekends, however last drink was 2 days ago.  States she takes Vicodin 2-3 times a week and Xanax several times a week depending on stress level.   Past Medical History  Diagnosis Date  . Asthma     No inhaler use x 1 yr (2013)  . Lordosis   . Hypertension    Past Surgical History  Procedure Laterality Date  . Eye surgery    . Wisdom tooth extraction     Family History  Problem Relation Age of Onset  . Hypertension Maternal Grandmother   . Diabetes Maternal Grandmother    History  Substance Use Topics  . Smoking status: Current Some Day Smoker -- 1.00 packs/day for 10 years    Types: Cigarettes  . Smokeless tobacco: Never Used  . Alcohol Use: 0.0 oz/week   OB History    Gravida Para Term Preterm AB TAB SAB Ectopic Multiple Living   4 2 1 1 2 1 1  0 0 1     Review of Systems  All other  systems reviewed and are negative.     Allergies  Review of patient's allergies indicates no known allergies.  Home Medications   Prior to Admission medications   Medication Sig Start Date End Date Taking? Authorizing Provider  ALPRAZolam Prudy Feeler(XANAX) 1 MG tablet Take 1 mg by mouth 3 (three) times daily as needed for anxiety.  09/10/14  Yes Historical Provider, MD  fexofenadine (ALLEGRA) 60 MG tablet Take 1 tablet (60 mg total) by mouth 2 (two) times daily. 10/27/14  Yes Earley FavorGail Schulz, NP  HYDROcodone-acetaminophen (NORCO) 10-325 MG per tablet Take 1 tablet by mouth 3 (three) times daily as needed for moderate pain (lower back pain).  09/10/14  Yes Historical Provider, MD  lactulose (CHRONULAC) 10 GM/15ML solution Take 15 mLs (10 g total) by mouth 2 (two) times daily as needed for moderate constipation. 11/03/14  Yes Blane OharaJoshua Zavitz, MD  amoxicillin-clavulanate (AUGMENTIN) 875-125 MG per tablet Take 1 tablet by mouth 2 (two) times daily. One po bid x 7 days Patient not taking: Reported on 10/27/2014 09/20/14   Ladona MowJoe Mintz, PA-C  metroNIDAZOLE (FLAGYL) 500 MG tablet Take 1 tablet (500 mg total) by mouth 2 (two) times daily. Patient not taking: Reported on 10/27/2014 05/13/14   Marlon Peliffany Greene, PA-C  sodium chloride (OCEAN) 0.65 % SOLN nasal spray Place 1 spray into both nostrils as needed for congestion. Patient not taking: Reported on 11/09/2014  09/20/14   Ladona Mow, PA-C  traMADol (ULTRAM) 50 MG tablet Take 1 tablet (50 mg total) by mouth every 6 (six) hours as needed. 10/27/14   Earley Favor, NP   BP 136/70 mmHg  Pulse 82  Temp(Src) 98 F (36.7 C) (Oral)  Resp 18  SpO2 100%  LMP 10/10/2014 (Exact Date) Physical Exam  Constitutional: She is oriented to person, place, and time. She appears well-developed and well-nourished. No distress.  HENT:  Head: Normocephalic and atraumatic.  Right Ear: External ear normal.  Left Ear: External ear normal.  Nose: Nose normal.  Eyes: Conjunctivae and EOM are normal.  Pupils are equal, round, and reactive to light.  Neck: Normal range of motion. Neck supple.  Cardiovascular: Normal rate, regular rhythm, normal heart sounds and intact distal pulses.   No murmur heard. Pulmonary/Chest: Effort normal and breath sounds normal. She has no wheezes.  Abdominal: Soft. Bowel sounds are normal. There is no tenderness.  Musculoskeletal: Normal range of motion.  Lymphadenopathy:    She has no cervical adenopathy.  Neurological: She is alert and oriented to person, place, and time. No cranial nerve deficit. She exhibits normal muscle tone. Coordination normal.  Skin: Skin is warm and dry.  Psychiatric: She has a normal mood and affect. Thought content normal.    ED Course  Procedures (including critical care time) Labs Review Labs Reviewed  URINE RAPID DRUG SCREEN (HOSP PERFORMED) - Abnormal; Notable for the following:    Opiates POSITIVE (*)    Benzodiazepines POSITIVE (*)    Tetrahydrocannabinol POSITIVE (*)    All other components within normal limits  URINALYSIS, ROUTINE W REFLEX MICROSCOPIC - Abnormal; Notable for the following:    APPearance CLOUDY (*)    Hgb urine dipstick TRACE (*)    Protein, ur 30 (*)    All other components within normal limits  BASIC METABOLIC PANEL - Abnormal; Notable for the following:    Potassium 3.3 (*)    Glucose, Bld 104 (*)    GFR calc non Af Amer 76 (*)    GFR calc Af Amer 89 (*)    All other components within normal limits  URINE MICROSCOPIC-ADD ON - Abnormal; Notable for the following:    Squamous Epithelial / LPF FEW (*)    All other components within normal limits  PREGNANCY, URINE  CBC WITH DIFFERENTIAL/PLATELET  ETHANOL    Imaging Review Ct Head Wo Contrast  11/09/2014   CLINICAL DATA:  Patient with witnessed seizure by family.  EXAM: CT HEAD WITHOUT CONTRAST  TECHNIQUE: Contiguous axial images were obtained from the base of the skull through the vertex without intravenous contrast.  COMPARISON:  Brain CT  01/16/2014  FINDINGS: Ventricles and sulci are appropriate for patient's age. No evidence for acute cortically based infarct, intracranial hemorrhage, mass lesion or mass effect. Orbits are unremarkable. Paranasal sinuses are well aerated. Mastoid air cells are unremarkable. Calvarium is intact. 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  IMPRESSION: No acute intracranial process.   Electronically Signed   By: Annia Belt M.D.   On: 11/09/2014 14:18     EKG Interpretation None      MDM   Final diagnoses:  Episode of shaking  Seizure-like activity  Marijuana abuse    Ct scan negative. Spoke with DR. Cyril Mourning with neurology and pt to have follow up on outpt basis. Pt is neurologically intact at this time. Pt instructed on no driving.     Teressa Lower,  NP 11/09/14 1546  Azalia Bilis, MD 11/09/14 1622

## 2014-12-10 ENCOUNTER — Encounter (HOSPITAL_COMMUNITY): Payer: Self-pay | Admitting: Emergency Medicine

## 2014-12-10 ENCOUNTER — Ambulatory Visit: Payer: Medicaid Other | Admitting: Neurology

## 2014-12-10 ENCOUNTER — Emergency Department (HOSPITAL_COMMUNITY)
Admission: EM | Admit: 2014-12-10 | Discharge: 2014-12-11 | Disposition: A | Discharge status: Home / Self Care | Payer: Medicaid Other | Attending: Emergency Medicine | Admitting: Emergency Medicine

## 2014-12-10 DIAGNOSIS — R519 Headache, unspecified: Secondary | ICD-10-CM

## 2014-12-10 DIAGNOSIS — F419 Anxiety disorder, unspecified: Secondary | ICD-10-CM | POA: Insufficient documentation

## 2014-12-10 DIAGNOSIS — R51 Headache: Secondary | ICD-10-CM | POA: Diagnosis not present

## 2014-12-10 DIAGNOSIS — Z79899 Other long term (current) drug therapy: Secondary | ICD-10-CM | POA: Diagnosis not present

## 2014-12-10 DIAGNOSIS — Z3202 Encounter for pregnancy test, result negative: Secondary | ICD-10-CM | POA: Diagnosis not present

## 2014-12-10 DIAGNOSIS — I10 Essential (primary) hypertension: Secondary | ICD-10-CM | POA: Insufficient documentation

## 2014-12-10 DIAGNOSIS — Z720 Tobacco use: Secondary | ICD-10-CM | POA: Insufficient documentation

## 2014-12-10 DIAGNOSIS — Z791 Long term (current) use of non-steroidal anti-inflammatories (NSAID): Secondary | ICD-10-CM | POA: Insufficient documentation

## 2014-12-10 DIAGNOSIS — Z8739 Personal history of other diseases of the musculoskeletal system and connective tissue: Secondary | ICD-10-CM | POA: Diagnosis not present

## 2014-12-10 DIAGNOSIS — J45909 Unspecified asthma, uncomplicated: Secondary | ICD-10-CM | POA: Insufficient documentation

## 2014-12-10 LAB — CBC
HEMATOCRIT: 38.6 % (ref 36.0–46.0)
Hemoglobin: 12.8 g/dL (ref 12.0–15.0)
MCH: 29.2 pg (ref 26.0–34.0)
MCHC: 33.2 g/dL (ref 30.0–36.0)
MCV: 88.1 fL (ref 78.0–100.0)
Platelets: 300 10*3/uL (ref 150–400)
RBC: 4.38 MIL/uL (ref 3.87–5.11)
RDW: 15.2 % (ref 11.5–15.5)
WBC: 6.3 10*3/uL (ref 4.0–10.5)

## 2014-12-10 LAB — CBG MONITORING, ED: Glucose-Capillary: 106 mg/dL — ABNORMAL HIGH (ref 65–99)

## 2014-12-10 LAB — BASIC METABOLIC PANEL
Anion gap: 10 (ref 5–15)
BUN: 12 mg/dL (ref 6–20)
CALCIUM: 9.3 mg/dL (ref 8.9–10.3)
CO2: 25 mmol/L (ref 22–32)
CREATININE: 1 mg/dL (ref 0.44–1.00)
Chloride: 104 mmol/L (ref 101–111)
GFR calc Af Amer: >60 mL/min (ref >60)
GFR calc non Af Amer: >60 mL/min (ref >60)
GLUCOSE: 106 mg/dL — AB (ref 65–99)
Potassium: 2.7 mmol/L — CL (ref 3.5–5.1)
Sodium: 139 mmol/L (ref 135–145)

## 2014-12-10 LAB — POC URINE PREG, ED: PREG TEST UR: NEGATIVE

## 2014-12-10 NOTE — ED Notes (Signed)
Informed the pt that a urine specimen is needed. 

## 2014-12-11 ENCOUNTER — Other Ambulatory Visit: Payer: Self-pay

## 2014-12-11 LAB — URINE MICROSCOPIC-ADD ON

## 2014-12-11 LAB — URINALYSIS, ROUTINE W REFLEX MICROSCOPIC
GLUCOSE, UA: NEGATIVE mg/dL
HGB URINE DIPSTICK: NEGATIVE
Ketones, ur: 15 mg/dL — AB
Leukocytes, UA: NEGATIVE
Nitrite: NEGATIVE
Protein, ur: 30 mg/dL — AB
Specific Gravity, Urine: 1.035 — ABNORMAL HIGH (ref 1.005–1.030)
UROBILINOGEN UA: 1 mg/dL (ref 0.0–1.0)
pH: 6 (ref 5.0–8.0)

## 2014-12-11 MED ORDER — POTASSIUM CHLORIDE CRYS ER 20 MEQ PO TBCR
40.0000 meq | EXTENDED_RELEASE_TABLET | Freq: Once | ORAL | Status: AC
  Administered 2014-12-11: 40 meq via ORAL
  Filled 2014-12-11: qty 2

## 2014-12-11 MED ORDER — KETOROLAC TROMETHAMINE 30 MG/ML IJ SOLN
30.0000 mg | Freq: Once | INTRAMUSCULAR | Status: AC
  Administered 2014-12-11: 30 mg via INTRAVENOUS
  Filled 2014-12-11: qty 1

## 2014-12-11 MED ORDER — PROCHLORPERAZINE EDISYLATE 5 MG/ML IJ SOLN
10.0000 mg | Freq: Once | INTRAMUSCULAR | Status: AC
  Administered 2014-12-11: 10 mg via INTRAVENOUS
  Filled 2014-12-11: qty 2

## 2014-12-11 MED ORDER — DIPHENHYDRAMINE HCL 50 MG/ML IJ SOLN
25.0000 mg | INTRAMUSCULAR | Status: AC
  Administered 2014-12-11: 25 mg via INTRAVENOUS
  Filled 2014-12-11: qty 1

## 2014-12-11 MED ORDER — SODIUM CHLORIDE 0.9 % IV BOLUS (SEPSIS)
1000.0000 mL | INTRAVENOUS | Status: AC
Start: 2014-12-11 — End: 2014-12-11
  Administered 2014-12-11: 1000 mL via INTRAVENOUS

## 2014-12-11 NOTE — ED Notes (Signed)
Pt mother states "she feels herself going to sleep and she needs something to help her sleep and something for pain". Explained to pt provider will have to order same. Pt became verbally upset, cursing at this Clinical research associatewriter.

## 2014-12-11 NOTE — Discharge Instructions (Signed)
Please follow directions provided. Be sure to follow-up your primary care doctor to ensure you're getting better. Be sure to drink clear fluids to stay well hydrated. You may want to call your neurologist regarding her ER visit today. Don't hesitate to return for any new, worsening, or concerning symptoms.   SEEK IMMEDIATE MEDICAL CARE IF:  Your headache becomes severe.  You have a fever.  You have a stiff neck.  You have loss of vision.  You have muscular weakness or loss of muscle control.  You start losing your balance or have trouble walking.  You feel faint or pass out.  You have severe symptoms that are different from your first symptoms.

## 2014-12-11 NOTE — ED Provider Notes (Signed)
CSN: [id number]     Arrival date & time 5/14098119148/16  2246 History   First MD Initiated Contact with Patient 12/11/14 0022     No chief complaint on file.  (Consider location/radiation/quality/duration/timing/severity/associated sxs/prior Treatment) HPI Jessica Walton is a 29 yo female presenting with report of anxiety induced seizures.  She was diagnosed with shaking/seizures a few weeks ago.  She states she has anxiety since several stressful events have occurred.  She used to take xanax from one prescriber but has not had any in 2 months.  She had a check up today and was told she had a UTI but was otherwise fine.  When she came home from her check-up, she was trying to take a nap, she reports her seizure activity every time she fell asleep.  She states her seizure activity would last a few seconds and she would "pull herself out of it".  She states the activity involved her left arm and her head.  Her LMP was the beginning of this month.  She denies recent head injury, fevers, chills, neck stiffness or vomiting.      Past Medical History  Diagnosis Date  . Asthma     No inhaler use x 1 yr (2013)  . Lordosis   . Hypertension    Past Surgical History  Procedure Laterality Date  . Eye surgery    . Wisdom tooth extraction     Family History  Problem Relation Age of Onset  . Hypertension Maternal Grandmother   . Diabetes Maternal Grandmother    History  Substance Use Topics  . Smoking status: Current Some Day Smoker -- 1.00 packs/day for 10 years    Types: Cigarettes  . Smokeless tobacco: Never Used  . Alcohol Use: 0.0 oz/week   OB History    Gravida Para Term Preterm AB TAB SAB Ectopic Multiple Living   4 2 1 1 2 1 1  0 0 1     Review of Systems  Constitutional: Negative for fever and chills.  HENT: Negative for sore throat.   Eyes: Negative for visual disturbance.  Respiratory: Negative for cough and shortness of breath.   Cardiovascular: Negative for chest pain and leg  swelling.  Gastrointestinal: Negative for nausea, vomiting and diarrhea.  Genitourinary: Negative for dysuria.  Musculoskeletal: Negative for myalgias.  Skin: Negative for rash.  Neurological: Positive for seizures ( reports of intermittent shaking spells with no LOC) and headaches. Negative for weakness and numbness.  Psychiatric/Behavioral: The patient is nervous/anxious.       Allergies  Review of patient's allergies indicates no known allergies.  Home Medications   Prior to Admission medications   Medication Sig Start Date End Date Taking? Authorizing Provider  albuterol (PROVENTIL HFA;VENTOLIN HFA) 108 (90 BASE) MCG/ACT inhaler Inhale 2 puffs into the lungs every 6 (six) hours as needed for wheezing or shortness of breath.   Yes Historical Provider, MD  ALPRAZolam Prudy Feeler(XANAX) 1 MG tablet Take 1 mg by mouth 3 (three) times daily as needed for anxiety.  09/10/14  Yes Historical Provider, MD  HYDROcodone-acetaminophen (NORCO) 10-325 MG per tablet Take 1 tablet by mouth 3 (three) times daily as needed for moderate pain (lower back pain).  09/10/14  Yes Historical Provider, MD  naproxen sodium (ANAPROX) 220 MG tablet Take 440 mg by mouth 2 (two) times daily with a meal.   Yes Historical Provider, MD  amoxicillin-clavulanate (AUGMENTIN) 875-125 MG per tablet Take 1 tablet by mouth 2 (two) times daily. One po bid  x 7 days Patient not taking: Reported on 10/27/2014 09/20/14   Ladona MowJoe Mintz, PA-C  fexofenadine (ALLEGRA) 60 MG tablet Take 1 tablet (60 mg total) by mouth 2 (two) times daily. Patient not taking: Reported on 12/10/2014 10/27/14   Earley FavorGail Schulz, NP  lactulose (CHRONULAC) 10 GM/15ML solution Take 15 mLs (10 g total) by mouth 2 (two) times daily as needed for moderate constipation. Patient not taking: Reported on 12/10/2014 11/03/14   Blane OharaJoshua Zavitz, MD  metroNIDAZOLE (FLAGYL) 500 MG tablet Take 1 tablet (500 mg total) by mouth 2 (two) times daily. Patient not taking: Reported on 10/27/2014 05/13/14    Marlon Peliffany Greene, PA-C  sodium chloride (OCEAN) 0.65 % SOLN nasal spray Place 1 spray into both nostrils as needed for congestion. Patient not taking: Reported on 11/09/2014 09/20/14   Ladona MowJoe Mintz, PA-C  traMADol (ULTRAM) 50 MG tablet Take 1 tablet (50 mg total) by mouth every 6 (six) hours as needed. Patient not taking: Reported on 12/10/2014 10/27/14   Earley FavorGail Schulz, NP   BP 155/88 mmHg  Pulse 82  Temp(Src) 98.8 F (37.1 C) (Oral)  Resp 33  SpO2 100%  LMP 11/29/2014 Physical Exam  Constitutional: She is oriented to person, place, and time. She appears well-developed and well-nourished. No distress.  HENT:  Head: Normocephalic and atraumatic.  Mouth/Throat: Oropharynx is clear and moist.  Eyes: Conjunctivae and EOM are normal. Pupils are equal, round, and reactive to light.  Neck: Neck supple. No thyromegaly present.  Cardiovascular: Normal rate, regular rhythm and intact distal pulses.   Pulmonary/Chest: Effort normal and breath sounds normal. No respiratory distress. She has no wheezes. She has no rales. She exhibits no tenderness.  Abdominal: Soft. There is no tenderness.  Musculoskeletal: She exhibits no tenderness.  Lymphadenopathy:    She has no cervical adenopathy.  Neurological: She is alert and oriented to person, place, and time. No cranial nerve deficit. Coordination normal.  Neurological: She is alert and oriented to person, place, and time. She has normal strength. No cranial nerve deficit or sensory deficit. Coordination normal.  Alert, oriented, thought content appropriate. Speech fluent without evidence of aphasia. Able to follow 2 step commands without difficulty.  Cranial Nerves:  II:  Peripheral visual fields grossly normal, pupils equal, round, reactive to light III,IV, VI: ptosis not present, extra-ocular motions intact bilaterally  V,VII: smile symmetric, facial light touch sensation equal VIII: hearing grossly normal bilaterally  IX,X: gag reflex present  XI: bilateral  shoulder shrug equal and strong XII: midline tongue extension  Motor:  5/5 in upper and lower extremities bilaterally including strong and equal grip strength and dorsiflexion/plantar flexion Sensory: Pinprick and light touch normal in all extremities.  Deep Tendon Reflexes: 2+ and symmetric  Cerebellar: normal finger-to-nose with bilateral upper extremities Gait: normal gait and balance CV: distal pulses palpable throughout     Skin: Skin is warm and dry. No rash noted. She is not diaphoretic.  Psychiatric: She has a normal mood and affect.  Pt seems irritated during exam with questions regarding description of seizure activity.   Nursing note and vitals reviewed.   ED Course  Procedures (including critical care time) Labs Review Labs Reviewed  BASIC METABOLIC PANEL - Abnormal; Notable for the following:    Potassium 2.7 (*)    Glucose, Bld 106 (*)    All other components within normal limits  URINALYSIS, ROUTINE W REFLEX MICROSCOPIC - Abnormal; Notable for the following:    Color, Urine AMBER (*)    APPearance TURBID (*)  Specific Gravity, Urine 1.035 (*)    Bilirubin Urine SMALL (*)    Ketones, ur 15 (*)    Protein, ur 30 (*)    All other components within normal limits  URINE MICROSCOPIC-ADD ON - Abnormal; Notable for the following:    Squamous Epithelial / LPF FEW (*)    Casts HYALINE CASTS (*)    All other components within normal limits  CBG MONITORING, ED - Abnormal; Notable for the following:    Glucose-Capillary 106 (*)    All other components within normal limits  CBC  POC URINE PREG, ED    Imaging Review No results found.   EKG Interpretation None      MDM   Final diagnoses:  Nonintractable headache, unspecified chronicity pattern, unspecified headache type   29 yo with reports of recurrence of "seizure" activity due to anxiety.  She is complaining of gradual onset headache and requesting medicine to help her sleep.  Her presentation is like pts  typical HA and non concerning for [facility], ICH, Meningitis, or temporal arteritis. She is afebrile with no focal neuro deficits, nuchal rigidity, or change in vision. Her headache was treated and improved while in ED.   Pt has follow-up appointment with a neuropsychiatrist.  Pt is well-appearing, in no acute distress and vital signs are stable.  They appear safe to be discharged.  Return precautions provided. Pt verbalizes understanding and is agreeable with plan to dc.    Filed Vitals:   12/10/14 2306 12/10/14 2312  BP: 172/111 155/88  Pulse: 79 82  Temp: 98.7 F (37.1 C) 98.8 F (37.1 C)  TempSrc: Oral Oral  Resp: 20 33  SpO2: 100% 100%   Meds given in ED:  Medications  sodium chloride 0.9 % bolus 1,000 mL (0 mLs Intravenous Stopped 12/11/14 0204)  ketorolac (TORADOL) 30 MG/ML injection 30 mg (30 mg Intravenous Given 12/11/14 0102)  prochlorperazine (COMPAZINE) injection 10 mg (10 mg Intravenous Given 12/11/14 0101)  diphenhydrAMINE (BENADRYL) injection 25 mg (25 mg Intravenous Given 12/11/14 0104)  potassium chloride SA (K-DUR,KLOR-CON) CR tablet 40 mEq (40 mEq Oral Given 12/11/14 0107)    Discharge Medication List as of 12/11/2014  1:51 AM         Harle Battiest, NP 12/12/14 1610  Loren Racer, MD 12/12/14 [phone]

## 2014-12-15 LAB — DRUG PROFILE, UR, 9 DRUGS (LABCORP)
Amphetamines, Urine: NEGATIVE ng/mL
Barbiturate, Ur: NEGATIVE ng/mL
COCAINE (METAB.): NEGATIVE ng/mL
METHADONE SCREEN, URINE: NEGATIVE ng/mL
Opiate Quant, Ur: NEGATIVE ng/mL
PROPOXYPHENE, URINE: NEGATIVE ng/mL
Phencyclidine, Ur: NEGATIVE ng/mL

## 2014-12-16 ENCOUNTER — Encounter: Payer: Self-pay | Admitting: Neurology

## 2015-01-14 ENCOUNTER — Ambulatory Visit: Payer: Medicaid Other | Admitting: Neurology

## 2015-01-15 ENCOUNTER — Telehealth: Payer: Self-pay | Admitting: *Deleted

## 2015-01-15 NOTE — Telephone Encounter (Signed)
Spoke with pt who is requesting the ED make a referral to [facility] as pt "unable to make 2 appointments d/t seizure activity at the time of both appointments". Instructed the pt to call PCP who has "changed from [facility] to [facility]" and make appt. At that appt she will get appointment for Neurological. Pt verbalized understanding.

## 2015-02-22 ENCOUNTER — Emergency Department (HOSPITAL_COMMUNITY)
Admission: EM | Admit: 2015-02-22 | Discharge: 2015-02-23 | Disposition: A | Discharge status: Home / Self Care | Payer: Medicaid Other | Attending: Emergency Medicine | Admitting: Emergency Medicine

## 2015-02-22 ENCOUNTER — Encounter (HOSPITAL_COMMUNITY): Payer: Self-pay | Admitting: Oncology

## 2015-02-22 DIAGNOSIS — W19XXXA Unspecified fall, initial encounter: Secondary | ICD-10-CM

## 2015-02-22 DIAGNOSIS — Y9389 Activity, other specified: Secondary | ICD-10-CM | POA: Insufficient documentation

## 2015-02-22 DIAGNOSIS — Z720 Tobacco use: Secondary | ICD-10-CM | POA: Insufficient documentation

## 2015-02-22 DIAGNOSIS — I10 Essential (primary) hypertension: Secondary | ICD-10-CM | POA: Insufficient documentation

## 2015-02-22 DIAGNOSIS — J45909 Unspecified asthma, uncomplicated: Secondary | ICD-10-CM | POA: Diagnosis not present

## 2015-02-22 DIAGNOSIS — S3992XA Unspecified injury of lower back, initial encounter: Secondary | ICD-10-CM | POA: Insufficient documentation

## 2015-02-22 DIAGNOSIS — S0990XA Unspecified injury of head, initial encounter: Secondary | ICD-10-CM | POA: Insufficient documentation

## 2015-02-22 DIAGNOSIS — Z202 Contact with and (suspected) exposure to infections with a predominantly sexual mode of transmission: Secondary | ICD-10-CM | POA: Insufficient documentation

## 2015-02-22 DIAGNOSIS — Y92009 Unspecified place in unspecified non-institutional (private) residence as the place of occurrence of the external cause: Secondary | ICD-10-CM | POA: Insufficient documentation

## 2015-02-22 DIAGNOSIS — M545 Low back pain, unspecified: Secondary | ICD-10-CM

## 2015-02-22 DIAGNOSIS — Y998 Other external cause status: Secondary | ICD-10-CM | POA: Diagnosis not present

## 2015-02-22 DIAGNOSIS — W1839XA Other fall on same level, initial encounter: Secondary | ICD-10-CM | POA: Insufficient documentation

## 2015-02-22 DIAGNOSIS — Z3202 Encounter for pregnancy test, result negative: Secondary | ICD-10-CM | POA: Diagnosis not present

## 2015-02-22 NOTE — ED Notes (Addendum)
Per pt she is concerned that her boyfriend gave her an STD he was treated for gonorhhea.  Also pt had a fall last week and hit her head.  +LOC.  Pt denies blurred/double vision.  States she does feel unsteady on her feet.  Pt rates her pain 10/10 all over her back nagging in nature.  Denies any incontinence.

## 2015-02-23 ENCOUNTER — Emergency Department (HOSPITAL_COMMUNITY): Payer: Medicaid Other

## 2015-02-23 LAB — WET PREP, GENITAL: TRICH WET PREP: NONE SEEN

## 2015-02-23 LAB — URINALYSIS, ROUTINE W REFLEX MICROSCOPIC
Glucose, UA: NEGATIVE mg/dL
Ketones, ur: 15 mg/dL — AB
NITRITE: NEGATIVE
Protein, ur: 30 mg/dL — AB
SPECIFIC GRAVITY, URINE: 1.04 — AB (ref 1.005–1.030)
Urobilinogen, UA: 1 mg/dL (ref 0.0–1.0)
pH: 6 (ref 5.0–8.0)

## 2015-02-23 LAB — URINE MICROSCOPIC-ADD ON

## 2015-02-23 LAB — POC URINE PREG, ED: PREG TEST UR: NEGATIVE

## 2015-02-23 LAB — GC/CHLAMYDIA PROBE AMP (~~LOC~~) NOT AT ARMC
Chlamydia: NEGATIVE
NEISSERIA GONORRHEA: NEGATIVE

## 2015-02-23 LAB — RPR: RPR Ser Ql: NONREACTIVE

## 2015-02-23 LAB — PREGNANCY, URINE: Preg Test, Ur: NEGATIVE

## 2015-02-23 LAB — HIV ANTIBODY (ROUTINE TESTING W REFLEX): HIV Screen 4th Generation wRfx: NONREACTIVE

## 2015-02-23 MED ORDER — CEFTRIAXONE SODIUM 250 MG IJ SOLR
250.0000 mg | Freq: Once | INTRAMUSCULAR | Status: AC
  Administered 2015-02-23: 250 mg via INTRAMUSCULAR
  Filled 2015-02-23: qty 250

## 2015-02-23 MED ORDER — AZITHROMYCIN 1 G PO PACK
1.0000 g | PACK | Freq: Once | ORAL | Status: AC
  Administered 2015-02-23: 1 g via ORAL
  Filled 2015-02-23: qty 1

## 2015-02-23 MED ORDER — LIDOCAINE HCL (PF) 1 % IJ SOLN
INTRAMUSCULAR | Status: AC
  Administered 2015-02-23: 2 mL
  Filled 2015-02-23: qty 5

## 2015-02-23 MED ORDER — ORPHENADRINE CITRATE ER 100 MG PO TB12: 100.0000 mg | ORAL_TABLET | Freq: Two times a day (BID) | ORAL | Status: DC

## 2015-02-23 MED ORDER — CYCLOBENZAPRINE HCL 10 MG PO TABS
10.0000 mg | ORAL_TABLET | Freq: Once | ORAL | Status: AC
  Administered 2015-02-23: 10 mg via ORAL

## 2015-02-23 MED ORDER — CYCLOBENZAPRINE HCL 10 MG PO TABS
ORAL_TABLET | ORAL | Status: AC
  Filled 2015-02-23: qty 1

## 2015-02-23 NOTE — ED Provider Notes (Signed)
CSN: [id number]     Arrival date & time 02/22/15  2326 History   First MD Initiated Contact with Patient 02/23/15 0059     Chief Complaint  Patient presents with  . Exposure to STD  . Fall     (Consider location/radiation/quality/duration/timing/severity/associated sxs/prior Treatment) Patient is a 29 y.o. female presenting with STD exposure and fall. The history is provided by the patient.  Exposure to STD  Fall  Her boyfriend was diagnosed with gonorrhea and treated yesterday. She does admit to a slight vaginal discharge but cannot tell me about its color. There's been some vaginal itching and she's also been having dysuria for about the last week which she thought was a urinary tract infection. She is currently on her menses but states that the cramps are worse than normal. She states that her normal cramps are rated at 10/10 so she continues to rate her current pain at 10/10 even though it is worse than her normal cramps. She denies fever, chills, sweats. There is no nausea or vomiting. Also, [place] 1 [place] and hit her head. She continues to have an occipital headache. She is also complaining of some pain in her lower back. She denies any ongoing dizziness or vision change and denies any weakness or numbness or tingling.  Past Medical History  Diagnosis Date  . Asthma     No inhaler use x 1 yr (2013)  . Lordosis   . Hypertension    Past Surgical History  Procedure Laterality Date  . Eye surgery    . Wisdom tooth extraction     Family History  Problem Relation Age of Onset  . Hypertension Maternal Grandmother   . Diabetes Maternal Grandmother    History  Substance Use Topics  . Smoking status: Current Some Day Smoker -- 1.00 packs/day for 10 years    Types: Cigarettes  . Smokeless tobacco: Never Used  . Alcohol Use: 0.0 oz/week   OB History    Gravida Para Term Preterm AB TAB SAB Ectopic Multiple Living   0 0 1     Review of  Systems  All other systems reviewed and are negative.     Allergies  Review of patient's allergies indicates no known allergies.  Home Medications   Prior to Admission medications   Medication Sig Start Date End Date Taking? Authorizing Provider  ibuprofen (ADVIL,MOTRIN) 200 MG tablet Take 400 mg by mouth every 6 (six) hours as needed for moderate pain.   Yes Historical Provider, MD  albuterol (PROVENTIL HFA;VENTOLIN HFA) 108 (90 BASE) MCG/ACT inhaler Inhale 2 puffs into the lungs every 6 (six) hours as needed for wheezing or shortness of breath.    Historical Provider, MD  amoxicillin-clavulanate (AUGMENTIN) 875-125 MG per tablet Take 1 tablet by mouth 2 (two) times daily. One po bid x 7 days Patient not taking: Reported on 10/27/2014 09/20/14   Ladona Mow, PA-C  fexofenadine (ALLEGRA) 60 MG tablet Take 1 tablet (60 mg total) by mouth 2 (two) times daily. Patient not taking: Reported on 12/10/2014 10/27/14   Earley Favor, NP  lactulose (CHRONULAC) 10 GM/15ML solution Take 15 mLs (10 g total) by mouth 2 (two) times daily as needed for moderate constipation. Patient not taking: Reported on 12/10/2014 11/03/14   Blane Ohara, MD  metroNIDAZOLE (FLAGYL) 500 MG tablet Take 1 tablet (500 mg total) by mouth 2 (two) times daily. Patient not taking: Reported on 10/27/2014  05/13/14   Tiffany Neva Seat, PA-C  sodium chloride (OCEAN) 0.65 % SOLN nasal spray Place 1 spray into both nostrils as needed for congestion. Patient not taking: Reported on 11/09/2014 09/20/14   Ladona Mow, PA-C  traMADol (ULTRAM) 50 MG tablet Take 1 tablet (50 mg total) by mouth every 6 (six) hours as needed. Patient not taking: Reported on 12/10/2014 10/27/14   Earley Favor, NP   BP 139/96 mmHg  Pulse 89  Temp(Src) 98.4 F (36.9 C) (Oral)  Resp 18  SpO2 98%  LMP 01/28/2015 Physical Exam  Nursing note and vitals reviewed.  29 year old female, resting comfortably and in no acute distress. Vital signs are significant for hypertension.  Oxygen saturation is 98%, which is normal. Head is normocephalic and atraumatic. PERRLA, EOMI. Oropharynx is clear. Neck is nontender and supple without adenopathy or JVD. Back is mildly tender in the mid lumbar area. There is no paralumbar spasm. There is no CVA tenderness. Pelvic: Normal external female genitalia. Small amount of menstrual flow present in the vaginal vault. Cervix is closed. There is no cervical motion tenderness. Fundus is normal size and position without tenderness. There are no adnexal masses or tenderness. Lungs are clear without rales, wheezes, or rhonchi. Chest is nontender. Heart has regular rate and rhythm without murmur. Abdomen is soft, flat, nontender without masses or hepatosplenomegaly and peristalsis is normoactive. Extremities have no cyanosis or edema, full range of motion is present. Skin is warm and dry without rash. Neurologic: Mental status is normal, cranial nerves are intact, there are no motor or sensory deficits.  ED Course  Procedures (including critical care time) Labs Review Results for orders placed or performed during the hospital encounter of 02/22/15  Wet prep, genital  Result Value Ref Range   Yeast Wet Prep HPF POC FEW (A) NONE SEEN   Trich, Wet Prep NONE SEEN NONE SEEN   Clue Cells Wet Prep HPF POC MODERATE (A) NONE SEEN   WBC, Wet Prep HPF POC FEW (A) NONE SEEN  Urinalysis, Routine w reflex microscopic (not at [facility])  Result Value Ref Range   Color, Urine AMBER (A) YELLOW   APPearance CLOUDY (A) CLEAR   Specific Gravity, Urine 1.040 (H) 1.005 - 1.030   pH 6.0 5.0 - 8.0   Glucose, UA NEGATIVE NEGATIVE mg/dL   Hgb urine dipstick MODERATE (A) NEGATIVE   Bilirubin Urine SMALL (A) NEGATIVE   Ketones, ur 15 (A) NEGATIVE mg/dL   Protein, ur 30 (A) NEGATIVE mg/dL   Urobilinogen, UA 1.0 0.0 - 1.0 mg/dL   Nitrite NEGATIVE NEGATIVE   Leukocytes, UA SMALL (A) NEGATIVE  Pregnancy, urine  Result Value Ref Range   Preg Test, Ur NEGATIVE  NEGATIVE  Urine microscopic-add on  Result Value Ref Range   Squamous Epithelial / LPF FEW (A) RARE   WBC, UA 3-6 <3 WBC/hpf   RBC / HPF 3-6 <3 RBC/hpf   Bacteria, UA RARE RARE   Urine-Other MUCOUS PRESENT   POC urine preg, ED  Result Value Ref Range   Preg Test, Ur NEGATIVE NEGATIVE   Imaging Review Ct Head Wo Contrast  02/23/2015   CLINICAL DATA:  Larey Seat from a standing position, striking her posterior head.  EXAM: CT HEAD WITHOUT CONTRAST  TECHNIQUE: Contiguous axial images were obtained from the base of the skull through the vertex without intravenous contrast.  COMPARISON:  11/09/2014  FINDINGS: There is no intracranial hemorrhage, mass or evidence of acute infarction. There is no extra-axial fluid collection. Wallace Cullens  matter and white matter appear normal. Cerebral volume is normal for age. Brainstem and posterior fossa are unremarkable. The CSF spaces appear normal.  The bony structures are intact. The visible portions of the paranasal sinuses are clear.  IMPRESSION: Normal brain   Electronically Signed   By: Ellery Plunk M.D.   On: 02/23/2015 02:14    MDM   Final diagnoses:  Fall at home, initial encounter  Exposure to STD  Midline low back pain without sciatica    Exposure to STD. STD panel is ordered and she is given treatment for gonorrhea with injection of ceftriaxone and oral azithromycin. Urinalysis is obtained to look for urinary tract infection. She'll be sent for CT of her head given her ongoing headaches.  As I was preparing to discharge her, she was requesting pain medication for her back. She has been tolerating back pain for a week and there is no sign of any serious injury on exam. Advised her to use over-the-counter analgesia 6. She is requesting a muscle relaxer, so she is given a prescription for orphenadrine.  Dione Booze, MD 02/23/15 [phone]

## 2015-02-23 NOTE — Discharge Instructions (Signed)
Cultures as well as HIV test and test for syphilis are pending. We will contact you if any are positive. Do not have sex with near partner until approximately 1 week after both of you have been treated.  For your back, apply ice several times a day. Take ibuprofen and/or acetaminophen for pain.  Sexually Transmitted Disease A sexually transmitted disease (STD) is a disease or infection that may be passed (transmitted) from person to person, usually during sexual activity. This may happen by way of saliva, semen, blood, vaginal mucus, or urine. Common STDs include:   Gonorrhea.   Chlamydia.   Syphilis.   HIV and AIDS.   Genital herpes.   Hepatitis B and C.   Trichomonas.   Human papillomavirus (HPV).   Pubic lice.   Scabies.  Mites.  Bacterial vaginosis. WHAT ARE CAUSES OF STDs? An STD may be caused by bacteria, a virus, or parasites. STDs are often transmitted during sexual activity if one person is infected. However, they may also be transmitted through nonsexual means. STDs may be transmitted after:   Sexual intercourse with an infected person.   Sharing sex toys with an infected person.   Sharing needles with an infected person or using unclean piercing or tattoo needles.  Having intimate contact with the genitals, mouth, or rectal areas of an infected person.   Exposure to infected fluids during birth. WHAT ARE THE SIGNS AND SYMPTOMS OF STDs? Different STDs have different symptoms. Some people may not have any symptoms. If symptoms are present, they may include:   Painful or bloody urination.   Pain in the pelvis, abdomen, vagina, anus, throat, or eyes.   A skin rash, itching, or irritation.  Growths, ulcerations, blisters, or sores in the genital and anal areas.  Abnormal vaginal discharge with or without bad odor.   Penile discharge in men.   Fever.   Pain or bleeding during sexual intercourse.   Swollen glands in the groin area.    Yellow skin and eyes (jaundice). This is seen with hepatitis.   Swollen testicles.  Infertility.  Sores and blisters in the mouth. HOW ARE STDs DIAGNOSED? To make a diagnosis, your health care provider may:   Take a medical history.   Perform a physical exam.   Take a sample of any discharge to examine.  Swab the throat, cervix, opening to the penis, rectum, or vagina for testing.  Test a sample of your first morning urine.   Perform blood tests.   Perform a Pap test, if this applies.   Perform a colposcopy.   Perform a laparoscopy.  HOW ARE STDs TREATED? Treatment depends on the STD. Some STDs may be treated but not cured.   Chlamydia, gonorrhea, trichomonas, and syphilis can be cured with antibiotic medicine.   Genital herpes, hepatitis, and HIV can be treated, but not cured, with prescribed medicines. The medicines lessen symptoms.   Genital warts from HPV can be treated with medicine or by freezing, burning (electrocautery), or surgery. Warts may come back.   HPV cannot be cured with medicine or surgery. However, abnormal areas may be removed from the cervix, vagina, or vulva.   If your diagnosis is confirmed, your recent sexual partners need treatment. This is true even if they are symptom-free or have a negative culture or evaluation. They should not have sex until their health care providers say it is okay. HOW CAN I REDUCE MY RISK OF GETTING AN STD? Take these steps to reduce your risk of  getting an STD:  Use latex condoms, dental dams, and water-soluble lubricants during sexual activity. Do not use petroleum jelly or oils.  Avoid having multiple sex partners.  Do not have sex with someone who has other sex partners.  Do not have sex with anyone you do not know or who is at high risk for an STD.  Avoid risky sex practices that can break your skin.  Do not have sex if you have open sores on your mouth or skin.  Avoid drinking too much  alcohol or taking illegal drugs. Alcohol and drugs can affect your judgment and put you in a vulnerable position.  Avoid engaging in oral and anal sex acts.  Get vaccinated for HPV and hepatitis. If you have not received these vaccines in the past, talk to your health care provider about whether one or both might be right for you.   If you are at risk of being infected with HIV, it is recommended that you take a prescription medicine daily to prevent HIV infection. This is called pre-exposure prophylaxis (PrEP). You are considered at risk if:  You are a man who has sex with other men (MSM).  You are a heterosexual man or woman and are sexually active with more than one partner.  You take drugs by injection.  You are sexually active with a partner who has HIV.  Talk with your health care provider about whether you are at high risk of being infected with HIV. If you choose to begin PrEP, you should first be tested for HIV. You should then be tested every 3 months for as long as you are taking PrEP.  WHAT SHOULD I DO IF I THINK I HAVE AN STD?  See your health care provider.   Tell your sexual partner(s). They should be tested and treated for any STDs.  Do not have sex until your health care provider says it is okay. WHEN SHOULD I GET IMMEDIATE MEDICAL CARE? Contact your health care provider right away if:   You have severe abdominal pain.  You are a man and notice swelling or pain in your testicles.  You are a woman and notice swelling or pain in your vagina. Document Released: 10/01/2002 Document Revised: 07/16/2013 Document Reviewed: 01/29/2013 [facility] Patient Information 2015 [place], [place]. This information is not intended to replace advice given to you by your health care provider. Make sure you discuss any questions you have with your health care provider.  Back Pain, Adult Low back pain is very common. About 1 in 5 people have back pain.The cause of low back pain is  rarely dangerous. The pain often gets better over time.About half of people with a sudden onset of back pain feel better in just 2 weeks. About 8 in 10 people feel better by 6 weeks.  CAUSES Some common causes of back pain include:  Strain of the muscles or ligaments supporting the spine.  Wear and tear (degeneration) of the spinal discs.  Arthritis.  Direct injury to the back. DIAGNOSIS Most of the time, the direct cause of low back pain is not known.However, back pain can be treated effectively even when the exact cause of the pain is unknown.Answering your caregiver's questions about your overall health and symptoms is one of the most accurate ways to make sure the cause of your pain is not dangerous. If your caregiver needs more information, he or she may order lab work or imaging tests (X-rays or MRIs).However, even if imaging tests  show changes in your back, this usually does not require surgery. HOME CARE INSTRUCTIONS For many people, back pain returns.Since low back pain is rarely dangerous, it is often a condition that people can learn to [facility] their own.   Remain active. It is stressful on the back to sit or stand in one place. Do not sit, drive, or stand in one place for more than 30 minutes at a time. Take short walks on level surfaces as soon as pain allows.Try to increase the length of time you walk each day.  Do not stay in bed.Resting more than 1 or 2 days can delay your recovery.  Do not avoid exercise or work.Your body is made to move.It is not dangerous to be active, even though your back may hurt.Your back will likely heal faster if you return to being active before your pain is gone.  Pay attention to your body when you bend and lift. Many people have less discomfortwhen lifting if they bend their knees, keep the load close to their bodies,and avoid twisting. Often, the most comfortable positions are those that put less stress on your recovering  back.  Find a comfortable position to sleep. Use a firm mattress and lie on your side with your knees slightly bent. If you lie on your back, put a pillow under your knees.  Only take over-the-counter or prescription medicines as directed by your caregiver. Over-the-counter medicines to reduce pain and inflammation are often the most helpful.Your caregiver may prescribe muscle relaxant drugs.These medicines help dull your pain so you can more quickly return to your normal activities and healthy exercise.  Put ice on the injured area.  Put ice in a plastic bag.  Place a towel between your skin and the bag.  Leave the ice on for 15-20 minutes, 03-04 times a day for the first 2 to 3 days. After that, ice and heat may be alternated to reduce pain and spasms.  Ask your caregiver about trying back exercises and gentle massage. This may be of some benefit.  Avoid feeling anxious or stressed.Stress increases muscle tension and can worsen back pain.It is important to recognize when you are anxious or stressed and learn ways to manage it.Exercise is a great option. SEEK MEDICAL CARE IF:  You have pain that is not relieved with rest or medicine.  You have pain that does not improve in 1 week.  You have new symptoms.  You are generally not feeling well. SEEK IMMEDIATE MEDICAL CARE IF:   You have pain that radiates from your back into your legs.  You develop new bowel or bladder control problems.  You have unusual weakness or numbness in your arms or legs.  You develop nausea or vomiting.  You develop abdominal pain.  You feel faint. Document Released: 07/11/2005 Document Revised: 01/10/2012 Document Reviewed: 11/12/2013 [facility] Patient Information 2015 [place], [place]. This information is not intended to replace advice given to you by your health care provider. Make sure you discuss any questions you have with your health care provider.  Orphenadrine tablets What is this  medicine? ORPHENADRINE (or FEN a dreen) helps to relieve pain and stiffness in muscles and can treat muscle spasms. This medicine may be used for other purposes; ask your health care provider or pharmacist if you have questions. COMMON BRAND NAME(S): Norflex What should I tell my health care provider before I take this medicine? They need to know if you have any of these conditions: -glaucoma -heart disease -kidney disease -  myasthenia gravis -peptic ulcer disease -prostate disease -stomach problems -an unusual or allergic reaction to orphenadrine, other medicines, foods, lactose, dyes, or preservatives -pregnant or trying to get pregnant -breast-feeding How should I use this medicine? Take this medicine by mouth with a full glass of water. Follow the directions on the prescription label. Take your medicine at regular intervals. Do not take your medicine more often than directed. Do not take more than you are told to take. Talk to your pediatrician regarding the use of this medicine in children. Special care may be needed. Patients over 46 years old may have a stronger reaction and need a smaller dose. Overdosage: If you think you have taken too much of this medicine contact a poison control center or emergency room at once. NOTE: This medicine is only for you. Do not share this medicine with others. What if I miss a dose? If you miss a dose, take it as soon as you can. If it is almost time for your next dose, take only that dose. Do not take double or extra doses. What may interact with this medicine? -alcohol -antihistamines -barbiturates, like phenobarbital -benzodiazepines -cyclobenzaprine -medicines for pain -phenothiazines like chlorpromazine, mesoridazine, prochlorperazine, thioridazine This list may not describe all possible interactions. Give your health care provider a list of all the medicines, herbs, non-prescription drugs, or dietary supplements you use. Also tell them if  you smoke, drink alcohol, or use illegal drugs. Some items may interact with your medicine. What should I watch for while using this medicine? Your mouth may get dry. Chewing sugarless gum or sucking hard candy, and drinking plenty of water may help. Contact your doctor if the problem does not go away or is severe. This medicine may cause dry eyes and blurred vision. If you wear contact lenses you may feel some discomfort. Lubricating drops may help. See your eye doctor if the problem does not go away or is severe. You may get drowsy or dizzy. Do not drive, use machinery, or do anything that needs mental alertness until you know how this medicine affects you. Do not stand or sit up quickly, especially if you are an older patient. This reduces the risk of dizzy or fainting spells. Alcohol may interfere with the effect of this medicine. Avoid alcoholic drinks. What side effects may I notice from receiving this medicine? Side effects that you should report to your doctor or health care professional as soon as possible: -allergic reactions like skin rash, itching or hives, swelling of the face, lips, or tongue -changes in vision -difficulty breathing -fast heartbeat or palpitations -hallucinations -light headedness, fainting spells -vomiting Side effects that usually do not require medical attention (report to your doctor or health care professional if they continue or are bothersome): -dizziness -drowsiness -headache -nausea This list may not describe all possible side effects. Call your doctor for medical advice about side effects. You may report side effects to FDA at 1-800-FDA-1088. Where should I keep my medicine? Keep out of the reach of children. Store at room temperature between 15 and 30 degrees C (59 and 86 degrees F). Protect from light. Keep container tightly closed. Throw away any unused medicine after the expiration date. NOTE: This sheet is a summary. It may not cover all possible  information. If you have questions about this medicine, talk to your doctor, pharmacist, or health care provider.  2015, [employer]. (2008-02-05 17:19:12)

## 2015-04-09 ENCOUNTER — Emergency Department (HOSPITAL_COMMUNITY)
Admission: EM | Admit: 2015-04-09 | Discharge: 2015-04-09 | Disposition: A | Discharge status: Home / Self Care | Payer: Medicaid Other | Attending: Emergency Medicine | Admitting: Emergency Medicine

## 2015-04-09 ENCOUNTER — Encounter (HOSPITAL_COMMUNITY): Payer: Self-pay

## 2015-04-09 DIAGNOSIS — I10 Essential (primary) hypertension: Secondary | ICD-10-CM | POA: Diagnosis not present

## 2015-04-09 DIAGNOSIS — Y998 Other external cause status: Secondary | ICD-10-CM | POA: Diagnosis not present

## 2015-04-09 DIAGNOSIS — Z8739 Personal history of other diseases of the musculoskeletal system and connective tissue: Secondary | ICD-10-CM | POA: Diagnosis not present

## 2015-04-09 DIAGNOSIS — Z720 Tobacco use: Secondary | ICD-10-CM | POA: Diagnosis not present

## 2015-04-09 DIAGNOSIS — Y92009 Unspecified place in unspecified non-institutional (private) residence as the place of occurrence of the external cause: Secondary | ICD-10-CM | POA: Insufficient documentation

## 2015-04-09 DIAGNOSIS — R51 Headache: Secondary | ICD-10-CM | POA: Diagnosis not present

## 2015-04-09 DIAGNOSIS — Z79899 Other long term (current) drug therapy: Secondary | ICD-10-CM | POA: Insufficient documentation

## 2015-04-09 DIAGNOSIS — Z3202 Encounter for pregnancy test, result negative: Secondary | ICD-10-CM | POA: Diagnosis not present

## 2015-04-09 DIAGNOSIS — R569 Unspecified convulsions: Secondary | ICD-10-CM | POA: Insufficient documentation

## 2015-04-09 DIAGNOSIS — S0502XA Injury of conjunctiva and corneal abrasion without foreign body, left eye, initial encounter: Secondary | ICD-10-CM | POA: Diagnosis not present

## 2015-04-09 DIAGNOSIS — J45909 Unspecified asthma, uncomplicated: Secondary | ICD-10-CM | POA: Insufficient documentation

## 2015-04-09 DIAGNOSIS — W01198A Fall on same level from slipping, tripping and stumbling with subsequent striking against other object, initial encounter: Secondary | ICD-10-CM | POA: Insufficient documentation

## 2015-04-09 DIAGNOSIS — Y9389 Activity, other specified: Secondary | ICD-10-CM | POA: Diagnosis not present

## 2015-04-09 HISTORY — DX: Unspecified convulsions: R56.9

## 2015-04-09 LAB — COMPREHENSIVE METABOLIC PANEL
ALT: 13 U/L — ABNORMAL LOW (ref 14–54)
AST: 20 U/L (ref 15–41)
Albumin: 4.1 g/dL (ref 3.5–5.0)
Alkaline Phosphatase: 45 U/L (ref 38–126)
Anion gap: 7 (ref 5–15)
BUN: 10 mg/dL (ref 6–20)
CHLORIDE: 103 mmol/L (ref 101–111)
CO2: 26 mmol/L (ref 22–32)
Calcium: 8.7 mg/dL — ABNORMAL LOW (ref 8.9–10.3)
Creatinine, Ser: 1.09 mg/dL — ABNORMAL HIGH (ref 0.44–1.00)
Glucose, Bld: 100 mg/dL — ABNORMAL HIGH (ref 65–99)
POTASSIUM: 3.1 mmol/L — AB (ref 3.5–5.1)
Sodium: 136 mmol/L (ref 135–145)
Total Bilirubin: 0.7 mg/dL (ref 0.3–1.2)
Total Protein: 7.3 g/dL (ref 6.5–8.1)

## 2015-04-09 LAB — CBC WITH DIFFERENTIAL/PLATELET
Basophils Absolute: 0 10*3/uL (ref 0.0–0.1)
Basophils Relative: 0 %
EOS PCT: 1 %
Eosinophils Absolute: 0 10*3/uL (ref 0.0–0.7)
HCT: 37.6 % (ref 36.0–46.0)
Hemoglobin: 12.8 g/dL (ref 12.0–15.0)
LYMPHS PCT: 16 %
Lymphs Abs: 1.2 10*3/uL (ref 0.7–4.0)
MCH: 29.8 pg (ref 26.0–34.0)
MCHC: 34 g/dL (ref 30.0–36.0)
MCV: 87.4 fL (ref 78.0–100.0)
MONO ABS: 0.5 10*3/uL (ref 0.1–1.0)
Monocytes Relative: 6 %
Neutro Abs: 6.2 10*3/uL (ref 1.7–7.7)
Neutrophils Relative %: 77 %
Platelets: 273 10*3/uL (ref 150–400)
RBC: 4.3 MIL/uL (ref 3.87–5.11)
RDW: 14.1 % (ref 11.5–15.5)
WBC: 8 10*3/uL (ref 4.0–10.5)

## 2015-04-09 LAB — URINALYSIS, ROUTINE W REFLEX MICROSCOPIC
BILIRUBIN URINE: NEGATIVE
Glucose, UA: NEGATIVE mg/dL
Hgb urine dipstick: NEGATIVE
KETONES UR: NEGATIVE mg/dL
Leukocytes, UA: NEGATIVE
NITRITE: NEGATIVE
PROTEIN: 30 mg/dL — AB
Specific Gravity, Urine: 1.023 (ref 1.005–1.030)
UROBILINOGEN UA: 0.2 mg/dL (ref 0.0–1.0)
pH: 5.5 (ref 5.0–8.0)

## 2015-04-09 LAB — URINE MICROSCOPIC-ADD ON

## 2015-04-09 LAB — I-STAT BETA HCG BLOOD, ED (MC, WL, AP ONLY): I-stat hCG, quantitative: <5 m[IU]/mL (ref <5)

## 2015-04-09 MED ORDER — SODIUM CHLORIDE 0.9 % IV BOLUS (SEPSIS)
1000.0000 mL | Freq: Once | INTRAVENOUS | Status: AC
  Administered 2015-04-09: 1000 mL via INTRAVENOUS

## 2015-04-09 NOTE — Progress Notes (Signed)
EDCM consulted to speak to patient as she has been seen in the ED with similar symptoms six times within six months.  EDCM spoke to patient and her mother at bedside.  Patient reports she has not seen her pcp in two months because "the car is in the shop."  Patient reports she doesn't drive, mother drives patient.  Discussed importance on following up with pcp upon discharged from the ED.  Patient reports she does not have transportation.    EDCM provided patient and patient's mother with phone number for Medicaid transport.  Discussed using the ED for emergencies only and not as pcp.  Discussed purpose of having a pcp.  Patient and mother verbalize understanding.  Patient's mother reports she will call Medicaid transport tomorrow and [facility] to schedule the patient an appointment.  Patient and her mother thankful for services.  No further EDCM needs at this time.  Discussed with EDP.

## 2015-04-09 NOTE — ED Provider Notes (Signed)
CSN: [id number]     Arrival date & time 04/09/15  1607 History   First MD Initiated Contact with Patient 04/09/15 1633     Chief Complaint  Patient presents with  . Seizures     (Consider location/radiation/quality/duration/timing/severity/associated sxs/prior Treatment) HPI Comments: 29 y.o. Female with history of HTN, possible seizures presents for seizure.  The patient and her mother report that the patient was feeling well today until she had a seizure like episode while doing hair.  The patient's mother says that she was in the other room but when she came to the patient's side the patient was stiff and jerking all over just like she has been during previous episodes.  The mother then put a cold towel on the patient's forehead and massaged her jaw.  The patient came right out of the episode and right away was talking to her mother without any apparent confusion after the episode.  The patient did not bite her tongue or lose control of her bowels/bladder.  The patient and her mother report this is just like previous episodes and that the episodes began shortly after the patient experienced the loss of her son and fiance followed by the loss of her other son's half brother.  The patient has had a very difficult time dealing and managing these losses since that time.  The patient has not been able to follow up outpatient for this issue secondary to social issues and difficulty with transportation.  Patient denies chest pain, shortness of breath, palpitations.  No family history of sudden unexplained death.   Past Medical History  Diagnosis Date  . Asthma     No inhaler use x 1 yr (2013)  . Lordosis   . Hypertension   . Seizures    Past Surgical History  Procedure Laterality Date  . Eye surgery    . Wisdom tooth extraction     Family History  Problem Relation Age of Onset  . Hypertension Maternal Grandmother   . Diabetes Maternal Grandmother    Social History  Substance Use Topics    . Smoking status: Current Some Day Smoker -- 1.00 packs/day for 10 years    Types: Cigarettes  . Smokeless tobacco: Never Used  . Alcohol Use: 0.0 oz/week   OB History    Gravida Para Term Preterm AB TAB SAB Ectopic Multiple Living   4 2 1 1 2 1 1  0 0 1     Review of Systems  Constitutional: Negative for fever, chills, appetite change and fatigue.  HENT: Negative for congestion, postnasal drip and rhinorrhea.   Eyes: Negative for pain, redness and visual disturbance.  Respiratory: Negative for cough, chest tightness and shortness of breath.   Cardiovascular: Negative for chest pain and palpitations.  Genitourinary: Negative for dysuria, flank pain and decreased urine volume.  Musculoskeletal: Negative for myalgias and back pain.  Skin: Negative for rash.  Neurological: Positive for seizures and headaches. Negative for dizziness, speech difficulty, weakness and numbness.  Hematological: Does not bruise/bleed easily.      Allergies  Review of patient's allergies indicates no known allergies.  Home Medications   Prior to Admission medications   Medication Sig Start Date End Date Taking? Authorizing Provider  albuterol (PROVENTIL HFA;VENTOLIN HFA) 108 (90 BASE) MCG/ACT inhaler Inhale 2 puffs into the lungs every 6 (six) hours as needed for wheezing or shortness of breath.   Yes Historical Provider, MD  ALPRAZolam Prudy Feeler) 1 MG tablet Take 1 mg by mouth 3 (three)  times daily as needed for anxiety.  02/24/15  Yes Historical Provider, MD  ibuprofen (ADVIL,MOTRIN) 200 MG tablet Take 400 mg by mouth every 6 (six) hours as needed for moderate pain.   Yes Historical Provider, MD  TRINESSA LO 0.18/0.215/0.25 MG-25 MCG tab Take 1 tablet by mouth daily. 02/08/15  Yes Historical Provider, MD  amoxicillin-clavulanate (AUGMENTIN) 875-125 MG per tablet Take 1 tablet by mouth 2 (two) times daily. One po bid x 7 days Patient not taking: Reported on 10/27/2014 09/20/14   Ladona Mow, PA-C  fexofenadine  (ALLEGRA) 60 MG tablet Take 1 tablet (60 mg total) by mouth 2 (two) times daily. Patient not taking: Reported on 12/10/2014 10/27/14   Earley Favor, NP  lactulose (CHRONULAC) 10 GM/15ML solution Take 15 mLs (10 g total) by mouth 2 (two) times daily as needed for moderate constipation. Patient not taking: Reported on 12/10/2014 11/03/14   Blane Ohara, MD  orphenadrine (NORFLEX) 100 MG tablet Take 1 tablet (100 mg total) by mouth 2 (two) times daily. Patient not taking: Reported on 04/09/2015 02/23/15   Dione Booze, MD  sodium chloride (OCEAN) 0.65 % SOLN nasal spray Place 1 spray into both nostrils as needed for congestion. Patient not taking: Reported on 11/09/2014 09/20/14   Ladona Mow, PA-C  traMADol (ULTRAM) 50 MG tablet Take 1 tablet (50 mg total) by mouth every 6 (six) hours as needed. Patient not taking: Reported on 12/10/2014 10/27/14   Earley Favor, NP   BP 158/93 mmHg  Pulse 88  Temp(Src) 97.6 F (36.4 C) (Oral)  Resp 18  SpO2 100%  LMP 03/23/2015 Physical Exam  Constitutional: She is oriented to person, place, and time. She appears well-developed and well-nourished. No distress.  HENT:  Head: Normocephalic. Head is with abrasion (small abrasion lateral to left eye as depicted). Head is without contusion and without laceration.    Right Ear: External ear normal.  Left Ear: External ear normal.  Mouth/Throat: Oropharynx is clear and moist. No oropharyngeal exudate.  Eyes: EOM are normal. Pupils are equal, round, and reactive to light. Right eye exhibits normal extraocular motion. Left eye exhibits normal extraocular motion.  Neck: Normal range of motion. Neck supple.  Cardiovascular: Normal rate, regular rhythm, normal heart sounds and intact distal pulses.   No murmur heard. Pulmonary/Chest: Effort normal. No respiratory distress. She has no wheezes. She has no rales.  Abdominal: Soft. She exhibits no distension. There is no tenderness.  Musculoskeletal: Normal range of motion. She  exhibits no edema or tenderness.  Neurological: She is alert and oriented to person, place, and time. No sensory deficit.  Normal facial sensation including over the left zygomatic arch  Skin: Skin is warm and dry. She is not diaphoretic.  Vitals reviewed.   ED Course  Procedures (including critical care time) Labs Review Labs Reviewed  COMPREHENSIVE METABOLIC PANEL - Abnormal; Notable for the following:    Potassium 3.1 (*)    Glucose, Bld 100 (*)    Creatinine, Ser 1.09 (*)    Calcium 8.7 (*)    ALT 13 (*)    All other components within normal limits  URINALYSIS, ROUTINE W REFLEX MICROSCOPIC (NOT AT [facility]) - Abnormal; Notable for the following:    APPearance CLOUDY (*)    Protein, ur 30 (*)    All other components within normal limits  URINE MICROSCOPIC-ADD ON - Abnormal; Notable for the following:    Squamous Epithelial / LPF MANY (*)    Bacteria, UA FEW (*)  All other components within normal limits  CBC WITH DIFFERENTIAL/PLATELET  I-STAT BETA HCG BLOOD, ED ([facility], [facility], [facility] ONLY)    Imaging Review No results found. I have personally reviewed and evaluated these images and lab results as part of my medical decision-making.   EKG Interpretation   Date/Time:  Thursday April 09 2015 16:22:36 EDT Ventricular Rate:  87 PR Interval:  147 QRS Duration: 76 QT Interval:  405 QTC Calculation: 487 R Axis:   89 Text Interpretation:  Sinus rhythm Biatrial enlargement Borderline  prolonged QT interval No significant change since last tracing Confirmed  by Legacy Lacivita ([id number]) on 04/09/2015 4:58:53 PM      MDM  Patient seen and evaluated in stable condition.  EKG with borderline prolonged QT but not long enough to be cause for episode.  Cr mildly elevated and patient given IV fluids.  Other laboratory studies unremarkable.  Patient remained stable in the ER.  It was discussed at length with patient and family the need for outpatient follow up as these episodes seem to have an  emotional/stress component and do not sound like a true seizure.  Social work and care management saw the patient to help facilitate use of resources to get to outpatient appointments.  Patient was discharged home in stable condition with strict return precautions and referral for outpatient neurology follow up. Final diagnoses:  Seizure-like activity    1. Seizure- like episode  2. Uncontrolled grief    Leta Baptist, MD 04/10/15 [phone]

## 2015-04-09 NOTE — Discharge Instructions (Signed)
Seizure Like Episode Your episode may have been a seizure but does not sound like a typical seizure as you woke up right away.  Many things can cause episodes like seizures.  This needs to be followed up with a neurologist.  You cannot drive or operate heavy machinery until cleared by a neurologist.   Nonepileptic seizures are seizures that are not caused by abnormal electrical signals in your brain. These seizures often seem like epileptic seizures, but they are not caused by epilepsy.  There are two types of nonepileptic seizures:  A physiologic nonepileptic seizure results from a disruption in your brain.  A psychogenic seizure results from emotional stress. These seizures are sometimes called pseudoseizures. CAUSES  Causes of physiologic nonepileptic seizures include:   Sudden drop in blood pressure.  Low blood sugar.  Low levels of salt (sodium) in your blood.  Low levels of calcium in your blood.  Migraine.  Heart rhythm problems.  Sleep disorders.  Drug and alcohol abuse. Common causes of psychogenic nonepileptic seizures include:  Stress.  Emotional trauma.  Sexual or physical abuse.  Major life events, such as divorce or the death of a loved one.  Mental health disorders, including panic attack and hyperactivity disorder. SIGNS AND SYMPTOMS A nonepileptic seizure can look like an epileptic seizure, including uncontrollable shaking (convulsions), or changes in attention, behavior, or the ability to remain awake and alert. However, there are some differences. Nonepileptic seizures usually:  Do not cause physical injuries.  Start slowly.  Include crying or shrieking.  Last longer than 2 minutes.  Have a short recovery time without headache or exhaustion. DIAGNOSIS  Your health care provider can usually diagnose nonepileptic seizures after taking your medical history and giving you a physical exam. Your health care provider may want to talk to your friends or  relatives who have seen you have a seizure.  You may also need to have tests to look for causes of physiologic nonepileptic seizures. This may include an electroencephalogram (EEG), which is a test that measures electrical activity in your brain. If you have had an epileptic seizure, the results of your EEG will be abnormal. If your health care provider thinks you have had a psychogenic nonepileptic seizure, you may need to see a mental health specialist for an evaluation. TREATMENT  Treatment depends on the type and cause of your seizures.  For physiologic nonepileptic seizures, treatment is aimed at addressing the underlying condition that caused the seizures. These seizures usually stop when the underlying condition is properly treated.  Nonepileptic seizures do not respond to the seizure medicines used to treat epilepsy.  For psychogenic seizures, you may need to work with a mental health specialist. HOME CARE INSTRUCTIONS Home care will depend on the type of nonepileptic seizures you have.   Follow all your health care provider's instructions.  Keep all your follow-up appointments. SEEK MEDICAL CARE IF: You continue to have seizures after treatment. SEEK IMMEDIATE MEDICAL CARE IF:  Your seizures change or become more frequent.  You injure yourself during a seizure.  You have one seizure after another.  You have trouble recovering from a seizure.  You have chest pain or trouble breathing. MAKE SURE YOU:  Understand these instructions.  Will watch your condition.  Will get help right away if you are not doing well or get worse. Document Released: 08/26/2005 Document Revised: 11/25/2013 Document Reviewed: 05/07/2013 [facility] Patient Information 2015 [place], [place]. This information is not intended to replace advice given to you by  your health care provider. Make sure you discuss any questions you have with your health care provider.

## 2015-04-09 NOTE — ED Notes (Signed)
Per EMS, pt from home.  Pt has hx of seizures.  Started last year.  Per family, pt had seizure at home.  Lasted 2 min.  Fell from standing position and hit corner of table.  Pt c/o headache, dizziness.  No blurred vision, no n/v or back pain.  Pt is alert and oriented.  Pt does not take seizure meds.  Only takes xanax for anxiety.  Vitals: 140/80, hr 90, resp 16, cbg 97

## 2015-04-20 ENCOUNTER — Ambulatory Visit: Payer: Medicaid Other | Admitting: Neurology

## 2015-04-30 ENCOUNTER — Ambulatory Visit (INDEPENDENT_AMBULATORY_CARE_PROVIDER_SITE_OTHER): Payer: Medicaid Other | Admitting: Neurology

## 2015-04-30 ENCOUNTER — Encounter: Payer: Self-pay | Admitting: Neurology

## 2015-04-30 VITALS — BP 121/80 | HR 80 | Ht 69.0 in | Wt 159.8 lb

## 2015-04-30 DIAGNOSIS — R569 Unspecified convulsions: Secondary | ICD-10-CM | POA: Diagnosis not present

## 2015-04-30 NOTE — Patient Instructions (Signed)
Overall you are doing fairly well but I do want to suggest a few things today:   Remember to drink plenty of fluid, eat healthy meals and do not skip any meals. Try to eat protein with a every meal and eat a healthy snack such as fruit or nuts in between meals. Try to keep a regular sleep-wake schedule and try to exercise daily, particularly in the form of walking, 20-30 minutes a day, if you can.   As far as your medications are concerned, I would like to suggest Continue Topamax  twice daily  As far as diagnostic testing: MRI of the brain, eeg  I would like to see you back in 3 months, sooner if we need to. Please call us with any interim questions, concerns, problems, updates or refill requests.   Our phone number is [phone]. We also have an after hours call service for urgent matters and there is a physician on-call for urgent questions. For any emergencies you know to call 911 or go to the nearest emergency room

## 2015-04-30 NOTE — Progress Notes (Signed)
[facility]    Provider:  Dr Lucia Gaskins Referring Provider: Jackie Plum, MD Primary Care Physician:  Jackie Plum, MD  CC:  seizures  HPI:  Jessica Walton is a 29 y.o. female here as a referral from Dr. Julio Sicks for seizure-like events. PMHx HTN, anxiety. She has a PMHx of chronic pain on narcotics, anxiety, high-risk pregnancy, anxiety-induced seizures. She has been having seizure-like events after stressful life events. Events started 5 months ago due to stress. A year and a half ago she had several deaths in the family and at that time she started having the events. It is different every time. Sometimes she is asleep, sometimes she is doing hair, sometimes she is very upset. The last event she was doing hair, she "fell out". She fell over a table, she doesn't remember, she hit her head ahd eyes on the table, had no clus what happened. Mother witnessed events, she ran in and she saw patient slumped over the table and jerking, she had hit her eye, had hit both eyes in the past. Patient says sometimes she can feel her body start jerking and she takes her medicine to make it stop. She has had 5 seizures total. In the last month she has had one seizure. She has had a few that are "controllable" but the others she has had to go to the ED. Beedn to the ED three times. The episodes last 2-3 minutes. She knows where she is when she wakes up, just wants to know what happened to her. She is in a lot of pain after the seizures, bad headache and pain. After the event, her legs feel very heavy. Takes a few days for legs to feel normal. She is tired afterwards. No history of seizures in the family. Mother has seen her shaking in her sleep, lips bleeding because she had bitten down so hard. She bi the whole side of her tongue and lip the last time. She is on topamax twice daily. Last seizure two weeks ago. Seems to be helping.   Reviewed notes, labs and imaging from outside physicians,  which showed:  CT head 02/2015 showed No acute intracranial abnormalities including mass lesion or mass effect, hydrocephalus, extra-axial fluid collection, midline shift, hemorrhage, or acute infarction, large ischemic events (personally reviewed images)  CBC nml CMP with elevated creatinine 1.09, potassium 3.1    Review of Systems: Patient complains of symptoms per HPI as well as the following symptomsL double vision, eye pain, ringing in ears, memory loss, confusion, headahce, numbness, seizure, passing out, insomnia, sleepiness, anxiety, not enough sleep. Pertinent negatives per HPI. All others negative.   Social History   Social History  . Marital Status: Single    Spouse Name: N/A  . Number of Children: N/A  . Years of Education: 14   Occupational History  . Not on file.   Social History Main Topics  . Smoking status: Current Some Day Smoker -- 0.25 packs/day for 10 years    Types: Cigarettes  . Smokeless tobacco: Never Used  . Alcohol Use: 0.0 oz/week    0 Standard drinks or equivalent per week  . Drug Use: No  . Sexual Activity: Yes    Birth Control/ Protection: Pill   Other Topics Concern  . Not on file   Social History Narrative   Lives at home with family.   Caffeine use: none    Family History  Problem Relation Age of Onset  . Hypertension Maternal Grandmother   .  Diabetes Maternal Grandmother   . Seizures Neg Hx     Past Medical History  Diagnosis Date  . Asthma     No inhaler use x 1 yr (2013)  . Lordosis   . Hypertension   . Seizures (HCC)   . Headache     Past Surgical History  Procedure Laterality Date  . Eye surgery    . Wisdom tooth extraction      x4    Current Outpatient Prescriptions  Medication Sig Dispense Refill  . albuterol (PROVENTIL HFA;VENTOLIN HFA) 108 (90 BASE) MCG/ACT inhaler Inhale 2 puffs into the lungs every 6 (six) hours as needed for wheezing or shortness of breath.    . ALPRAZolam (XANAX) 1 MG tablet Take 1 mg  by mouth 3 (three) times daily as needed for anxiety.   3  . amoxicillin-clavulanate (AUGMENTIN) 875-125 MG per tablet Take 1 tablet by mouth 2 (two) times daily. One po bid x 7 days (Patient not taking: Reported on 10/27/2014) 14 tablet 0  . fexofenadine (ALLEGRA) 60 MG tablet Take 1 tablet (60 mg total) by mouth 2 (two) times daily. (Patient not taking: Reported on 12/10/2014) 60 tablet 0  . ibuprofen (ADVIL,MOTRIN) 200 MG tablet Take 400 mg by mouth every 6 (six) hours as needed for moderate pain.    Marland Kitchen lactulose (CHRONULAC) 10 GM/15ML solution Take 15 mLs (10 g total) by mouth 2 (two) times daily as needed for moderate constipation. (Patient not taking: Reported on 12/10/2014) 240 mL 0  . orphenadrine (NORFLEX) 100 MG tablet Take 1 tablet (100 mg total) by mouth 2 (two) times daily. (Patient not taking: Reported on 04/09/2015) 30 tablet 0  . sodium chloride (OCEAN) 0.65 % SOLN nasal spray Place 1 spray into both nostrils as needed for congestion. (Patient not taking: Reported on 11/09/2014) 30 mL 0  . traMADol (ULTRAM) 50 MG tablet Take 1 tablet (50 mg total) by mouth every 6 (six) hours as needed. (Patient not taking: Reported on 12/10/2014) 12 tablet 0  . TRINESSA LO 0.18/0.215/0.25 MG-25 MCG tab Take 1 tablet by mouth daily.  1   No current facility-administered medications for this visit.    Allergies as of 04/30/2015  . (No Known Allergies)    Vitals: BP 121/80 mmHg  Pulse 80  Ht  (1.753 m)  Wt 159 lb 12.8 oz (72.485 kg)  BMI 23.59 kg/m2  LMP 03/23/2015 Last Weight:  Wt Readings from Last 1 Encounters:  04/30/15 159 lb 12.8 oz (72.485 kg)   Last Height:   Ht Readings from Last 1 Encounters:  04/30/15  (1.753 m)    Physical exam: Exam: Gen: NAD, conversant                     CV: RRR, no MRG. No Carotid Bruits. No peripheral edema, warm, nontender Eyes: Conjunctivae clear without exudates or hemorrhage  Neuro: Detailed Neurologic Exam  Speech:    Speech is normal;  fluent and spontaneous with normal comprehension.  Cognition:    The patient is oriented to person, place, and time;     recent and remote memory intact;     language fluent;     normal attention, concentration,     fund of knowledge Cranial Nerves:    The pupils are equal, round, and reactive to light. Attempted fundoscopic exam could not visualze. Visual fields are full to finger confrontation. Extraocular movements are intact. Trigeminal sensation is intact and the muscles of mastication  are normal. The face is symmetric. The palate elevates in the midline. Hearing intact. Voice is normal. Shoulder shrug is normal. The tongue has normal motion without fasciculations.   Coordination:    Normal finger to nose and heel to shin. Normal rapid alternating movements.   Gait:    Heel-toe and tandem gait are normal.   Motor Observation:    No asymmetry, no atrophy, and no involuntary movements noted. Tone:    Normal muscle tone.    Posture:    Posture is normal. normal erect    Strength:    Strength is V/V in the upper and lower limbs.      Sensation: intact to LT     Reflex Exam:  DTR's:    Deep tendon reflexes in the upper and lower extremities are normal bilaterally.   Toes:    The toes are downgoing bilaterally.   Clonus:    Clonus is absent.      Assessment/Plan:   29 y.o. female here as a referral from Dr. Julio Sicks for seizure-like events. PMHx HTN, anxiety. She has a PMHx of chronic pain on narcotics, anxiety, high-risk pregnancy, anxiety-induced seizures per records. She has been having seizure-like events after stressful life events per records. She has witnessed generalized shaking for 2 minutes and has bitten her tongue and lips with bleeding. She is on topamax 50mg  bid. Will order MRi of the brain, EEG, labs. Also consider 72 hour long-term video eeg if events persist.  Naomie Dean, MD  [facility] [address] [place], [place]  [address]  Phone [phone] Fax [phone]

## 2015-05-01 ENCOUNTER — Telehealth: Payer: Self-pay | Admitting: *Deleted

## 2015-05-01 LAB — BASIC METABOLIC PANEL
BUN / CREAT RATIO: 19 (ref 8–20)
BUN: 20 mg/dL (ref 6–20)
CO2: 19 mmol/L (ref 18–29)
CREATININE: 1.04 mg/dL — AB (ref 0.57–1.00)
Calcium: 9.8 mg/dL (ref 8.7–10.2)
Chloride: 104 mmol/L (ref 97–108)
GFR calc Af Amer: 85 mL/min/{1.73_m2} (ref >59)
GFR, EST NON AFRICAN AMERICAN: 74 mL/min/{1.73_m2} (ref >59)
Glucose: 89 mg/dL (ref 65–99)
Potassium: 4.9 mmol/L (ref 3.5–5.2)
SODIUM: 141 mmol/L (ref 134–144)

## 2015-05-01 LAB — TOPIRAMATE LEVEL: Topiramate Lvl: 5.9 ug/mL (ref 2.0–25.0)

## 2015-05-01 NOTE — Telephone Encounter (Signed)
-----   Message from Anson Fret, MD sent at 05/01/2015 10:00 AM EDT ----- Let patient know her BMP was unremarkable. Still waiting on topiramate levels. thanks

## 2015-05-01 NOTE — Telephone Encounter (Signed)
-----   Message from Anson Fret, MD sent at 05/01/2015  1:04 PM EDT ----- Topamax level is within normal limits, thanks

## 2015-05-01 NOTE — Telephone Encounter (Signed)
LVM for pt to call back about lab results. Gave [facility] phone number and hours. Ok to inform her topamax level is within normal limits.

## 2015-05-01 NOTE — Telephone Encounter (Signed)
Called and spoke to pt about BMP being unremarkable per Dr. Lucia Gaskins. Still waiting on topiramate levels and I will call her once this comes back. Pt verbalized understanding

## 2015-05-05 ENCOUNTER — Inpatient Hospital Stay: Admission: RE | Admit: 2015-05-05 | Payer: Medicaid Other | Source: Ambulatory Visit

## 2015-05-05 NOTE — Telephone Encounter (Signed)
Called and spoke with pt about normal topamax level. She verbalized understanding. Told her to call office if she has further questions.

## 2015-05-21 ENCOUNTER — Other Ambulatory Visit: Payer: Medicaid Other

## 2015-05-29 ENCOUNTER — Other Ambulatory Visit: Payer: Medicaid Other

## 2015-06-02 ENCOUNTER — Inpatient Hospital Stay: Admission: RE | Admit: 2015-06-02 | Payer: Medicaid Other | Source: Ambulatory Visit

## 2015-06-21 ENCOUNTER — Emergency Department (HOSPITAL_COMMUNITY)
Admission: EM | Admit: 2015-06-21 | Discharge: 2015-06-22 | Disposition: A | Discharge status: Home / Self Care | Payer: Medicaid Other | Attending: Emergency Medicine | Admitting: Emergency Medicine

## 2015-06-21 ENCOUNTER — Encounter (HOSPITAL_COMMUNITY): Payer: Self-pay | Admitting: Emergency Medicine

## 2015-06-21 DIAGNOSIS — E876 Hypokalemia: Secondary | ICD-10-CM | POA: Insufficient documentation

## 2015-06-21 DIAGNOSIS — J45909 Unspecified asthma, uncomplicated: Secondary | ICD-10-CM | POA: Diagnosis not present

## 2015-06-21 DIAGNOSIS — F17210 Nicotine dependence, cigarettes, uncomplicated: Secondary | ICD-10-CM | POA: Diagnosis not present

## 2015-06-21 DIAGNOSIS — H748X3 Other specified disorders of middle ear and mastoid, bilateral: Secondary | ICD-10-CM | POA: Insufficient documentation

## 2015-06-21 DIAGNOSIS — Z792 Long term (current) use of antibiotics: Secondary | ICD-10-CM | POA: Diagnosis not present

## 2015-06-21 DIAGNOSIS — Z79899 Other long term (current) drug therapy: Secondary | ICD-10-CM | POA: Diagnosis not present

## 2015-06-21 DIAGNOSIS — R531 Weakness: Secondary | ICD-10-CM | POA: Diagnosis present

## 2015-06-21 DIAGNOSIS — Z3202 Encounter for pregnancy test, result negative: Secondary | ICD-10-CM | POA: Insufficient documentation

## 2015-06-21 DIAGNOSIS — I10 Essential (primary) hypertension: Secondary | ICD-10-CM | POA: Diagnosis not present

## 2015-06-21 LAB — URINALYSIS, ROUTINE W REFLEX MICROSCOPIC
Glucose, UA: NEGATIVE mg/dL
Hgb urine dipstick: NEGATIVE
Ketones, ur: 15 mg/dL — AB
LEUKOCYTES UA: NEGATIVE
Nitrite: NEGATIVE
PROTEIN: NEGATIVE mg/dL
Specific Gravity, Urine: 1.028 (ref 1.005–1.030)
pH: 5.5 (ref 5.0–8.0)

## 2015-06-21 LAB — CBC
HCT: 42.8 % (ref 36.0–46.0)
Hemoglobin: 14.8 g/dL (ref 12.0–15.0)
MCH: 30.6 pg (ref 26.0–34.0)
MCHC: 34.6 g/dL (ref 30.0–36.0)
MCV: 88.6 fL (ref 78.0–100.0)
PLATELETS: 374 10*3/uL (ref 150–400)
RBC: 4.83 MIL/uL (ref 3.87–5.11)
RDW: 14.9 % (ref 11.5–15.5)
WBC: 5.5 10*3/uL (ref 4.0–10.5)

## 2015-06-21 LAB — BASIC METABOLIC PANEL
Anion gap: 9 (ref 5–15)
BUN: 9 mg/dL (ref 6–20)
CALCIUM: 9.5 mg/dL (ref 8.9–10.3)
CO2: 23 mmol/L (ref 22–32)
CREATININE: 1.11 mg/dL — AB (ref 0.44–1.00)
Chloride: 106 mmol/L (ref 101–111)
Glucose, Bld: 109 mg/dL — ABNORMAL HIGH (ref 65–99)
Potassium: 3 mmol/L — ABNORMAL LOW (ref 3.5–5.1)
SODIUM: 138 mmol/L (ref 135–145)

## 2015-06-21 LAB — CBG MONITORING, ED: GLUCOSE-CAPILLARY: 86 mg/dL (ref 65–99)

## 2015-06-21 LAB — POC URINE PREG, ED: Preg Test, Ur: NEGATIVE

## 2015-06-21 MED ORDER — SODIUM CHLORIDE 0.9 % IV BOLUS (SEPSIS)
1000.0000 mL | Freq: Once | INTRAVENOUS | Status: AC
  Administered 2015-06-21: 1000 mL via INTRAVENOUS

## 2015-06-21 MED ORDER — CEPHALEXIN 250 MG PO CAPS
500.0000 mg | ORAL_CAPSULE | Freq: Once | ORAL | Status: AC
  Administered 2015-06-21: 500 mg via ORAL
  Filled 2015-06-21: qty 2

## 2015-06-21 MED ORDER — POTASSIUM CHLORIDE 10 MEQ/100ML IV SOLN
10.0000 meq | Freq: Once | INTRAVENOUS | Status: AC
  Administered 2015-06-21: 10 meq via INTRAVENOUS
  Filled 2015-06-21: qty 100

## 2015-06-21 NOTE — ED Notes (Signed)
EDP at bedside  

## 2015-06-21 NOTE — ED Notes (Signed)
Patient with weakness, feels like she is going to pass out.  Patient with chest pain and shortness of breath.  No nausea or vomiting.  She states she has been feeling like this all day.

## 2015-06-21 NOTE — ED Notes (Signed)
CBG was 86 

## 2015-06-21 NOTE — ED Provider Notes (Signed)
CSN: [id number]     Arrival date & time 06/21/15  1941 History   First MD Initiated Contact with Patient 06/21/15 2128     Chief Complaint  Patient presents with  . Weakness     (Consider location/radiation/quality/duration/timing/severity/associated sxs/prior Treatment) Patient is a 29 y.o. female presenting with weakness.  Weakness This is a new problem. The current episode started 2 days ago. The problem occurs constantly. The problem has been gradually worsening. Pertinent negatives include no chest pain, no abdominal pain, no headaches and no shortness of breath. Nothing aggravates the symptoms.    Past Medical History  Diagnosis Date  . Asthma     No inhaler use x 1 yr (2013)  . Lordosis   . Hypertension   . Seizures (HCC)   . Headache    Past Surgical History  Procedure Laterality Date  . Eye surgery    . Wisdom tooth extraction      x4   Family History  Problem Relation Age of Onset  . Hypertension Maternal Grandmother   . Diabetes Maternal Grandmother   . Seizures Neg Hx    Social History  Substance Use Topics  . Smoking status: Current Some Day Smoker -- 0.25 packs/day for 10 years    Types: Cigarettes  . Smokeless tobacco: Never Used  . Alcohol Use: 0.0 oz/week    0 Standard drinks or equivalent per week   OB History    Gravida Para Term Preterm AB TAB SAB Ectopic Multiple Living   0 0 1     Review of Systems  HENT: Positive for ear pain. Negative for ear discharge.   Eyes: Negative for photophobia and pain.  Respiratory: Negative for shortness of breath.   Cardiovascular: Negative for chest pain.  Gastrointestinal: Positive for nausea. Negative for vomiting and abdominal pain.  Neurological: Positive for weakness. Negative for headaches.  All other systems reviewed and are negative.     Allergies  Review of patient's allergies indicates no known allergies.  Home Medications   Prior to Admission medications   Medication Sig  Start Date End Date Taking? Authorizing Provider  albuterol (PROVENTIL HFA;VENTOLIN HFA) 108 (90 BASE) MCG/ACT inhaler Inhale 2 puffs into the lungs every 6 (six) hours as needed for wheezing or shortness of breath.   Yes Historical Provider, MD  ALPRAZolam Prudy Feeler) 1 MG tablet Take 1 mg by mouth 3 (three) times daily as needed for anxiety.  02/24/15  Yes Historical Provider, MD  topiramate (TOPAMAX) 100 MG tablet Take 100 mg by mouth 2 (two) times daily.   Yes Historical Provider, MD  cephALEXin (KEFLEX) 500 MG capsule Take 1 capsule (500 mg total) by mouth 3 (three) times daily. 06/22/15   Marily Memos, MD  potassium chloride SA (K-DUR,KLOR-CON) 20 MEQ tablet Take 1 tablet (20 mEq total) by mouth daily. 06/22/15 06/29/15  Marily Memos, MD   BP 117/81 mmHg  Pulse 76  Temp(Src) 98.5 F (36.9 C) (Oral)  Resp 20  SpO2 100%  LMP 06/03/2015 (Exact Date) Physical Exam  Constitutional: She is oriented to person, place, and time. She appears well-developed and well-nourished.  HENT:  Head: Normocephalic and atraumatic.  Right Ear: Tympanic membrane is not erythematous and not bulging. A middle ear effusion is present.  Left Ear: Tympanic membrane is erythematous and bulging. A middle ear effusion is present.  Neck: Normal range of motion.  Cardiovascular: Normal rate and regular rhythm.   Pulmonary/Chest: No stridor. No  respiratory distress.  Abdominal: She exhibits no distension.  Neurological: She is alert and oriented to person, place, and time.  No altered mental status, able to give full seemingly accurate history.  Face is symmetric, EOM's intact, pupils equal and reactive, vision intact, tongue and uvula midline without deviation Upper and Lower extremity motor 5/5, intact pain perception in distal extremities, 2+ reflexes in biceps, patella and achilles tendons. Finger to nose normal, heel to shin normal. Walks without assistance or evident ataxia.  Nursing note and vitals reviewed.   ED  Course  Procedures (including critical care time) Labs Review Labs Reviewed  BASIC METABOLIC PANEL - Abnormal; Notable for the following:    Potassium 3.0 (*)    Glucose, Bld 109 (*)    Creatinine, Ser 1.11 (*)    All other components within normal limits  URINALYSIS, ROUTINE W REFLEX MICROSCOPIC (NOT AT [facility]) - Abnormal; Notable for the following:    Color, Urine AMBER (*)    APPearance CLOUDY (*)    Bilirubin Urine SMALL (*)    Ketones, ur 15 (*)    All other components within normal limits  CBC  CBG MONITORING, ED  POC URINE PREG, ED    Imaging Review No results found. I have personally reviewed and evaluated these images and lab results as part of my medical decision-making.   EKG Interpretation   Date/Time:  Sunday June 21 2015 19:46:45 EST Ventricular Rate:  104 PR Interval:  190 QRS Duration: 74 QT Interval:  326 QTC Calculation: 428 R Axis:   81 Text Interpretation:  Sinus tachycardia Right atrial enlargement Cannot  rule out Anterior infarct , age undetermined T wave abnormality, consider  inferolateral ischemia Abnormal ECG ED PHYSICIAN INTERPRETATION AVAILABLE  IN [facility] Confirmed by TEST, Record ([id number]) on 06/22/2015 7:13:55  AM      MDM   Final diagnoses:  Hypokalemia   Weakness recently with ear pain. Likely left AOM, no e/o complication. Also hypokalemic wich may be contributing to her weakness. Started abx and started repleting potassium. Stable for dc with pcp follow up for both.      Marily MemosJason Shwanda Soltis, MD 06/23/15 1240

## 2015-06-22 ENCOUNTER — Other Ambulatory Visit: Payer: Medicaid Other

## 2015-06-22 MED ORDER — POTASSIUM CHLORIDE CRYS ER 20 MEQ PO TBCR: 20.0000 meq | EXTENDED_RELEASE_TABLET | Freq: Every day | ORAL | Status: DC

## 2015-06-22 MED ORDER — CEPHALEXIN 500 MG PO CAPS: 500.0000 mg | ORAL_CAPSULE | Freq: Three times a day (TID) | ORAL | Status: DC

## 2015-06-22 NOTE — ED Notes (Signed)
Pt called out reporting IV site hurts, I assessed and found no infiltration or phlebitis informed pt that I would check with EDP to ensure no more med admin before removing. Pt then stated she was getting irritated with how long it was taking to be DC. EDP also notified

## 2015-07-14 ENCOUNTER — Other Ambulatory Visit: Payer: Medicaid Other

## 2015-07-15 ENCOUNTER — Encounter: Payer: Self-pay | Admitting: Neurology

## 2015-07-21 ENCOUNTER — Emergency Department (HOSPITAL_COMMUNITY)
Admission: EM | Admit: 2015-07-21 | Discharge: 2015-07-22 | Discharge status: Against Medical Advice | Payer: Medicaid Other | Attending: Emergency Medicine | Admitting: Emergency Medicine

## 2015-07-21 ENCOUNTER — Encounter (HOSPITAL_COMMUNITY): Payer: Self-pay | Admitting: Emergency Medicine

## 2015-07-21 DIAGNOSIS — F17210 Nicotine dependence, cigarettes, uncomplicated: Secondary | ICD-10-CM | POA: Diagnosis not present

## 2015-07-21 DIAGNOSIS — R1013 Epigastric pain: Secondary | ICD-10-CM | POA: Insufficient documentation

## 2015-07-21 DIAGNOSIS — R51 Headache: Secondary | ICD-10-CM | POA: Insufficient documentation

## 2015-07-21 DIAGNOSIS — I10 Essential (primary) hypertension: Secondary | ICD-10-CM | POA: Insufficient documentation

## 2015-07-21 DIAGNOSIS — M549 Dorsalgia, unspecified: Secondary | ICD-10-CM | POA: Insufficient documentation

## 2015-07-21 DIAGNOSIS — J45909 Unspecified asthma, uncomplicated: Secondary | ICD-10-CM | POA: Diagnosis not present

## 2015-07-21 LAB — COMPREHENSIVE METABOLIC PANEL
ALBUMIN: 4.2 g/dL (ref 3.5–5.0)
ALT: 16 U/L (ref 14–54)
ANION GAP: 7 (ref 5–15)
AST: 20 U/L (ref 15–41)
Alkaline Phosphatase: 49 U/L (ref 38–126)
BUN: 20 mg/dL (ref 6–20)
CHLORIDE: 105 mmol/L (ref 101–111)
CO2: 27 mmol/L (ref 22–32)
Calcium: 9.2 mg/dL (ref 8.9–10.3)
Creatinine, Ser: 0.98 mg/dL (ref 0.44–1.00)
GFR calc Af Amer: >60 mL/min (ref >60)
Glucose, Bld: 94 mg/dL (ref 65–99)
POTASSIUM: 4.2 mmol/L (ref 3.5–5.1)
Sodium: 139 mmol/L (ref 135–145)
Total Bilirubin: 0.3 mg/dL (ref 0.3–1.2)
Total Protein: 7.6 g/dL (ref 6.5–8.1)

## 2015-07-21 LAB — CBC
HEMATOCRIT: 39.8 % (ref 36.0–46.0)
HEMOGLOBIN: 13.3 g/dL (ref 12.0–15.0)
MCH: 29.6 pg (ref 26.0–34.0)
MCHC: 33.4 g/dL (ref 30.0–36.0)
MCV: 88.6 fL (ref 78.0–100.0)
Platelets: 349 10*3/uL (ref 150–400)
RBC: 4.49 MIL/uL (ref 3.87–5.11)
RDW: 15.3 % (ref 11.5–15.5)
WBC: 5.9 10*3/uL (ref 4.0–10.5)

## 2015-07-21 LAB — URINALYSIS, ROUTINE W REFLEX MICROSCOPIC
Bilirubin Urine: NEGATIVE
GLUCOSE, UA: NEGATIVE mg/dL
Hgb urine dipstick: NEGATIVE
Ketones, ur: NEGATIVE mg/dL
LEUKOCYTES UA: NEGATIVE
Nitrite: NEGATIVE
PH: 6 (ref 5.0–8.0)
Protein, ur: NEGATIVE mg/dL
Specific Gravity, Urine: 1.023 (ref 1.005–1.030)

## 2015-07-21 LAB — LIPASE, BLOOD: LIPASE: 42 U/L (ref 11–51)

## 2015-07-21 NOTE — ED Notes (Signed)
Patient c/o epigastric pain, "all over" back pain, headache x1 day. Denies N/V/D, fever, chills, urinary symptoms, numbness or tingling. Rates pain 9/10.

## 2015-07-22 NOTE — ED Notes (Signed)
Attempted to call pt from lobby without answer.  

## 2015-07-22 NOTE — ED Notes (Signed)
Attempted to call patient in the lobby. No answer.  

## 2015-08-12 ENCOUNTER — Ambulatory Visit: Payer: Medicaid Other | Admitting: Neurology

## 2015-08-25 ENCOUNTER — Ambulatory Visit: Payer: Medicaid Other | Admitting: Neurology

## 2015-08-26 ENCOUNTER — Encounter: Payer: Self-pay | Admitting: Neurology

## 2016-02-16 ENCOUNTER — Encounter (HOSPITAL_COMMUNITY): Payer: Self-pay | Admitting: Emergency Medicine

## 2016-02-16 ENCOUNTER — Emergency Department (HOSPITAL_COMMUNITY): Payer: Medicaid Other

## 2016-02-16 ENCOUNTER — Emergency Department (HOSPITAL_COMMUNITY)
Admission: EM | Admit: 2016-02-16 | Discharge: 2016-02-17 | Discharge status: Against Medical Advice | Payer: Medicaid Other | Attending: Emergency Medicine | Admitting: Emergency Medicine

## 2016-02-16 DIAGNOSIS — O23599 Infection of other part of genital tract in pregnancy, unspecified trimester: Secondary | ICD-10-CM | POA: Insufficient documentation

## 2016-02-16 DIAGNOSIS — M25561 Pain in right knee: Secondary | ICD-10-CM | POA: Insufficient documentation

## 2016-02-16 DIAGNOSIS — J45909 Unspecified asthma, uncomplicated: Secondary | ICD-10-CM | POA: Insufficient documentation

## 2016-02-16 DIAGNOSIS — Z7951 Long term (current) use of inhaled steroids: Secondary | ICD-10-CM | POA: Diagnosis not present

## 2016-02-16 DIAGNOSIS — I10 Essential (primary) hypertension: Secondary | ICD-10-CM | POA: Insufficient documentation

## 2016-02-16 DIAGNOSIS — O26899 Other specified pregnancy related conditions, unspecified trimester: Secondary | ICD-10-CM | POA: Insufficient documentation

## 2016-02-16 DIAGNOSIS — M25562 Pain in left knee: Secondary | ICD-10-CM | POA: Diagnosis not present

## 2016-02-16 DIAGNOSIS — Z79899 Other long term (current) drug therapy: Secondary | ICD-10-CM | POA: Diagnosis not present

## 2016-02-16 DIAGNOSIS — F17210 Nicotine dependence, cigarettes, uncomplicated: Secondary | ICD-10-CM | POA: Diagnosis not present

## 2016-02-16 DIAGNOSIS — R51 Headache: Secondary | ICD-10-CM | POA: Insufficient documentation

## 2016-02-16 DIAGNOSIS — Z3A00 Weeks of gestation of pregnancy not specified: Secondary | ICD-10-CM | POA: Insufficient documentation

## 2016-02-16 DIAGNOSIS — Z791 Long term (current) use of non-steroidal anti-inflammatories (NSAID): Secondary | ICD-10-CM | POA: Diagnosis not present

## 2016-02-16 DIAGNOSIS — K0889 Other specified disorders of teeth and supporting structures: Secondary | ICD-10-CM

## 2016-02-16 DIAGNOSIS — M25569 Pain in unspecified knee: Secondary | ICD-10-CM

## 2016-02-16 DIAGNOSIS — N898 Other specified noninflammatory disorders of vagina: Secondary | ICD-10-CM | POA: Diagnosis not present

## 2016-02-16 DIAGNOSIS — R1030 Lower abdominal pain, unspecified: Secondary | ICD-10-CM | POA: Insufficient documentation

## 2016-02-16 DIAGNOSIS — R079 Chest pain, unspecified: Secondary | ICD-10-CM | POA: Diagnosis not present

## 2016-02-16 DIAGNOSIS — R109 Unspecified abdominal pain: Secondary | ICD-10-CM

## 2016-02-16 DIAGNOSIS — R52 Pain, unspecified: Secondary | ICD-10-CM

## 2016-02-16 LAB — CBC WITH DIFFERENTIAL/PLATELET
Basophils Absolute: 0 10*3/uL (ref 0.0–0.1)
Basophils Relative: 0 %
EOS ABS: 0.1 10*3/uL (ref 0.0–0.7)
Eosinophils Relative: 2 %
HCT: 35.9 % — ABNORMAL LOW (ref 36.0–46.0)
Hemoglobin: 12.2 g/dL (ref 12.0–15.0)
LYMPHS ABS: 2.7 10*3/uL (ref 0.7–4.0)
LYMPHS PCT: 53 %
MCH: 30.2 pg (ref 26.0–34.0)
MCHC: 34 g/dL (ref 30.0–36.0)
MCV: 88.9 fL (ref 78.0–100.0)
MONOS PCT: 5 %
Monocytes Absolute: 0.3 10*3/uL (ref 0.1–1.0)
NEUTROS PCT: 40 %
Neutro Abs: 2 10*3/uL (ref 1.7–7.7)
Platelets: 315 10*3/uL (ref 150–400)
RBC: 4.04 MIL/uL (ref 3.87–5.11)
RDW: 14.8 % (ref 11.5–15.5)
WBC: 5.1 10*3/uL (ref 4.0–10.5)

## 2016-02-16 LAB — COMPREHENSIVE METABOLIC PANEL
ALT: 8 U/L — AB (ref 14–54)
ANION GAP: 5 (ref 5–15)
AST: 14 U/L — ABNORMAL LOW (ref 15–41)
Albumin: 4.1 g/dL (ref 3.5–5.0)
Alkaline Phosphatase: 38 U/L (ref 38–126)
BILIRUBIN TOTAL: 0.6 mg/dL (ref 0.3–1.2)
BUN: 14 mg/dL (ref 6–20)
CALCIUM: 8.6 mg/dL — AB (ref 8.9–10.3)
CO2: 27 mmol/L (ref 22–32)
CREATININE: 0.73 mg/dL (ref 0.44–1.00)
Chloride: 105 mmol/L (ref 101–111)
Glucose, Bld: 106 mg/dL — ABNORMAL HIGH (ref 65–99)
Potassium: 3.4 mmol/L — ABNORMAL LOW (ref 3.5–5.1)
SODIUM: 137 mmol/L (ref 135–145)
TOTAL PROTEIN: 7.1 g/dL (ref 6.5–8.1)

## 2016-02-16 LAB — I-STAT TROPONIN, ED: TROPONIN I, POC: 0.01 ng/mL (ref 0.00–0.08)

## 2016-02-16 LAB — PREGNANCY, URINE: PREG TEST UR: POSITIVE — AB

## 2016-02-16 LAB — LIPASE, BLOOD: LIPASE: 27 U/L (ref 11–51)

## 2016-02-16 MED ORDER — KETOROLAC TROMETHAMINE 30 MG/ML IJ SOLN
30.0000 mg | Freq: Once | INTRAMUSCULAR | Status: AC
  Administered 2016-02-16: 30 mg via INTRAVENOUS
  Filled 2016-02-16: qty 1

## 2016-02-16 NOTE — ED Triage Notes (Signed)
Per EMS: Pt reports chest wall pain x 4 days.  Reproducible and intermittent.  Not present currently. Chest pain comes about when she feels anxious. Also, headache x 2 years.  Intermittently since fiance and her son died.  Headaches last about 10 mins before subsiding and are described as sharp and pulsating on lt side.  Hx of fractured eye socket.

## 2016-02-16 NOTE — ED Notes (Signed)
Pt refusing IV placement. States that she was traumatized from a previous IV. Pt agrees to IM medication and blood draw. PA made aware.
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
## 2016-02-16 NOTE — ED Notes (Signed)
Pt states that they have someone waiting outside that has to go to work. They state that they cannot wait any longer. Pt informed that the provider is making her rounds.

## 2016-02-16 NOTE — ED Notes (Signed)
PA made aware of pt's statements about leaving.

## 2016-02-17 ENCOUNTER — Emergency Department (HOSPITAL_COMMUNITY): Payer: Medicaid Other

## 2016-02-17 LAB — URINE MICROSCOPIC-ADD ON: Bacteria, UA: NONE SEEN

## 2016-02-17 LAB — URINALYSIS, ROUTINE W REFLEX MICROSCOPIC
BILIRUBIN URINE: NEGATIVE
Glucose, UA: NEGATIVE mg/dL
KETONES UR: NEGATIVE mg/dL
NITRITE: NEGATIVE
PH: 7 (ref 5.0–8.0)
Protein, ur: NEGATIVE mg/dL
Specific Gravity, Urine: 1.024 (ref 1.005–1.030)

## 2016-02-17 LAB — WET PREP, GENITAL
Clue Cells Wet Prep HPF POC: NONE SEEN
SPERM: NONE SEEN
Trich, Wet Prep: NONE SEEN
Yeast Wet Prep HPF POC: NONE SEEN

## 2016-02-17 LAB — HIV ANTIBODY (ROUTINE TESTING W REFLEX): HIV SCREEN 4TH GENERATION: NONREACTIVE

## 2016-02-17 LAB — RPR: RPR Ser Ql: NONREACTIVE

## 2016-02-17 MED ORDER — CEFTRIAXONE SODIUM 250 MG IJ SOLR
250.0000 mg | Freq: Once | INTRAMUSCULAR | Status: AC
  Administered 2016-02-17: 250 mg via INTRAMUSCULAR
  Filled 2016-02-17: qty 250

## 2016-02-17 MED ORDER — OXYCODONE-ACETAMINOPHEN 5-325 MG PO TABS
1.0000 | ORAL_TABLET | Freq: Once | ORAL | Status: AC
Start: 2016-02-17 — End: 2016-02-17
  Administered 2016-02-17: 1 via ORAL
  Filled 2016-02-17: qty 1

## 2016-02-17 MED ORDER — STERILE WATER FOR INJECTION IJ SOLN
INTRAMUSCULAR | Status: AC
  Administered 2016-02-17: 1 mL
  Filled 2016-02-17: qty 10

## 2016-02-17 MED ORDER — AZITHROMYCIN 250 MG PO TABS
ORAL_TABLET | ORAL | Status: AC
  Filled 2016-02-17: qty 1

## 2016-02-17 MED ORDER — AZITHROMYCIN 250 MG PO TABS
1000.0000 mg | ORAL_TABLET | Freq: Once | ORAL | Status: AC
  Administered 2016-02-17: 1000 mg via ORAL
  Filled 2016-02-17: qty 4

## 2016-02-17 NOTE — Discharge Instructions (Signed)
I recommend returning to the Emergency Department tomorrow to have an ultrasound performed for further evaluation of your abdominal pain after having your abortion last week. Please return to the Emergency Department if symptoms worsen or new onset of fever, worsening abdominal pain, vomiting, vaginal bleeding, vaginal d/c, difficulty breathing, chest pain.

## 2016-02-17 NOTE — ED Notes (Signed)
Pt refused d/c vitals.

## 2016-02-17 NOTE — ED Notes (Signed)
Pt has called out 4 times within the last 5 minutes for an update. Pt made aware that the provider would be in there as soon as possible.

## 2016-02-17 NOTE — ED Notes (Signed)
PA at bedside.

## 2016-02-17 NOTE — ED Notes (Signed)
Pt chose to leave without [place].

## 2016-02-18 LAB — GC/CHLAMYDIA PROBE AMP (~~LOC~~) NOT AT ARMC
CHLAMYDIA, DNA PROBE: NEGATIVE
NEISSERIA GONORRHEA: NEGATIVE

## 2016-03-21 ENCOUNTER — Other Ambulatory Visit (HOSPITAL_COMMUNITY): Payer: Self-pay | Admitting: Respiratory Therapy

## 2016-03-21 DIAGNOSIS — R569 Unspecified convulsions: Secondary | ICD-10-CM

## 2016-04-05 ENCOUNTER — Ambulatory Visit (HOSPITAL_COMMUNITY): Payer: Medicaid Other

## 2016-04-12 ENCOUNTER — Ambulatory Visit (HOSPITAL_COMMUNITY)
Admission: RE | Admit: 2016-04-12 | Discharge: 2016-04-12 | Disposition: A | Discharge status: Home / Self Care | Payer: Medicaid Other | Source: Ambulatory Visit | Attending: Neurology | Admitting: Neurology

## 2016-04-12 DIAGNOSIS — G8929 Other chronic pain: Secondary | ICD-10-CM | POA: Insufficient documentation

## 2016-04-12 DIAGNOSIS — I10 Essential (primary) hypertension: Secondary | ICD-10-CM | POA: Insufficient documentation

## 2016-04-12 DIAGNOSIS — F419 Anxiety disorder, unspecified: Secondary | ICD-10-CM | POA: Insufficient documentation

## 2016-04-12 DIAGNOSIS — R569 Unspecified convulsions: Secondary | ICD-10-CM

## 2016-04-12 NOTE — Procedures (Signed)
ELECTROENCEPHALOGRAM REPORT  Date of Study: 04/12/2016  Patient's Name: Jessica Walton MRN: [id number] Date of Birth: 01-17-1986  Referring Provider: Caprice Redlukayode O. Ellan Lambertnasanya, MD  Clinical History49: 30 y.o. female with hypertension, chronic pain and anxiety here for seizure-like events.  Medications:  acetaminophen (TYLENOL) 500 MG tablet Take 1,000 mg by mouth every 6 (six) hours as needed for mild pain.  albuterol (PROVENTIL HFA;VENTOLIN HFA) 108 (90 BASE) MCG/ACT inhaler Inhale 2 puffs into the lungs every 6 (six) hours as needed for wheezing or shortness of breath.  ALPRAZolam (XANAX) 1 MG tablet Take 1 mg by mouth 3 (three) times daily as needed for anxiety.   cephALEXin (KEFLEX) 500 MG capsule Take 1 capsule (500 mg total) by mouth 3 (three) times daily. Patient not taking: Reported on 07/21/2015  ibuprofen (ADVIL,MOTRIN) 200 MG tablet Take 400 mg by mouth every 6 (six) hours as needed for moderate pain.  Technical Summary: A multichannel digital EEG recording measured by the international 10-20 system with electrodes applied with paste and impedances below 5000 ohms performed in our laboratory with EKG monitoring in an awake patient.  Hyperventilation and photic stimulation were performed.  The digital EEG was referentially recorded, reformatted, and digitally filtered in a variety of bipolar and referential montages for optimal display.    Description: The patient is awake during the recording.  During maximal wakefulness, there is a symmetric, medium voltage 11 Hz posterior dominant rhythm that attenuates with eye opening.  The record is symmetric.  Hyperventilation and photic stimulation did not elicit any abnormalities.  There were no epileptiform discharges or electrographic seizures seen.    EKG lead was unremarkable.  Impression: This awake EEG is normal.    Clinical Correlation: A normal EEG does not exclude a clinical diagnosis of epilepsy.  If further clinical questions  remain, prolonged EEG may be helpful.  Clinical correlation is advised.   Shon MilletAdam Perina Salvaggio, DO

## 2016-04-12 NOTE — Progress Notes (Signed)
EEG completed; results pending.    

## 2016-07-25 DIAGNOSIS — F1410 Cocaine abuse, uncomplicated: Secondary | ICD-10-CM

## 2016-07-25 DIAGNOSIS — F1110 Opioid abuse, uncomplicated: Secondary | ICD-10-CM

## 2016-07-25 DIAGNOSIS — F1120 Opioid dependence, uncomplicated: Secondary | ICD-10-CM | POA: Diagnosis present

## 2016-07-25 HISTORY — DX: Opioid abuse, uncomplicated: F11.10

## 2016-07-25 HISTORY — DX: Cocaine abuse, uncomplicated: F14.10

## 2016-07-25 HISTORY — DX: Opioid dependence, uncomplicated: F11.20

## 2016-07-28 ENCOUNTER — Encounter (HOSPITAL_COMMUNITY): Payer: Self-pay | Admitting: Emergency Medicine

## 2016-07-28 ENCOUNTER — Emergency Department (HOSPITAL_COMMUNITY)
Admission: EM | Admit: 2016-07-28 | Discharge: 2016-07-28 | Disposition: A | Discharge status: Home / Self Care | Payer: Medicaid Other | Attending: Emergency Medicine | Admitting: Emergency Medicine

## 2016-07-28 ENCOUNTER — Emergency Department (HOSPITAL_COMMUNITY): Payer: Medicaid Other

## 2016-07-28 DIAGNOSIS — Z7982 Long term (current) use of aspirin: Secondary | ICD-10-CM | POA: Diagnosis not present

## 2016-07-28 DIAGNOSIS — W01190A Fall on same level from slipping, tripping and stumbling with subsequent striking against furniture, initial encounter: Secondary | ICD-10-CM | POA: Insufficient documentation

## 2016-07-28 DIAGNOSIS — Y999 Unspecified external cause status: Secondary | ICD-10-CM | POA: Insufficient documentation

## 2016-07-28 DIAGNOSIS — T797XXA Traumatic subcutaneous emphysema, initial encounter: Secondary | ICD-10-CM | POA: Insufficient documentation

## 2016-07-28 DIAGNOSIS — F17210 Nicotine dependence, cigarettes, uncomplicated: Secondary | ICD-10-CM | POA: Diagnosis not present

## 2016-07-28 DIAGNOSIS — I10 Essential (primary) hypertension: Secondary | ICD-10-CM | POA: Insufficient documentation

## 2016-07-28 DIAGNOSIS — Y929 Unspecified place or not applicable: Secondary | ICD-10-CM | POA: Diagnosis not present

## 2016-07-28 DIAGNOSIS — M549 Dorsalgia, unspecified: Secondary | ICD-10-CM

## 2016-07-28 DIAGNOSIS — S3992XA Unspecified injury of lower back, initial encounter: Secondary | ICD-10-CM | POA: Diagnosis present

## 2016-07-28 DIAGNOSIS — Y939 Activity, unspecified: Secondary | ICD-10-CM | POA: Diagnosis not present

## 2016-07-28 DIAGNOSIS — J45909 Unspecified asthma, uncomplicated: Secondary | ICD-10-CM | POA: Diagnosis not present

## 2016-07-28 DIAGNOSIS — G44319 Acute post-traumatic headache, not intractable: Secondary | ICD-10-CM | POA: Insufficient documentation

## 2016-07-28 DIAGNOSIS — S39012A Strain of muscle, fascia and tendon of lower back, initial encounter: Secondary | ICD-10-CM | POA: Insufficient documentation

## 2016-07-28 DIAGNOSIS — R079 Chest pain, unspecified: Secondary | ICD-10-CM | POA: Insufficient documentation

## 2016-07-28 DIAGNOSIS — W19XXXA Unspecified fall, initial encounter: Secondary | ICD-10-CM

## 2016-07-28 LAB — CBC WITH DIFFERENTIAL/PLATELET
BASOS ABS: 0 10*3/uL (ref 0.0–0.1)
Basophils Relative: 0 %
Eosinophils Absolute: 0.1 10*3/uL (ref 0.0–0.7)
Eosinophils Relative: 1 %
HEMATOCRIT: 35.4 % — AB (ref 36.0–46.0)
HEMOGLOBIN: 12 g/dL (ref 12.0–15.0)
LYMPHS PCT: 42 %
Lymphs Abs: 2.3 10*3/uL (ref 0.7–4.0)
MCH: 29.8 pg (ref 26.0–34.0)
MCHC: 33.9 g/dL (ref 30.0–36.0)
MCV: 87.8 fL (ref 78.0–100.0)
Monocytes Absolute: 0.3 10*3/uL (ref 0.1–1.0)
Monocytes Relative: 5 %
NEUTROS PCT: 52 %
Neutro Abs: 2.8 10*3/uL (ref 1.7–7.7)
Platelets: 312 10*3/uL (ref 150–400)
RBC: 4.03 MIL/uL (ref 3.87–5.11)
RDW: 15.4 % (ref 11.5–15.5)
WBC: 5.5 10*3/uL (ref 4.0–10.5)

## 2016-07-28 LAB — COMPREHENSIVE METABOLIC PANEL
ALBUMIN: 3.9 g/dL (ref 3.5–5.0)
ALT: 13 U/L — ABNORMAL LOW (ref 14–54)
ANION GAP: 5 (ref 5–15)
AST: 16 U/L (ref 15–41)
Alkaline Phosphatase: 40 U/L (ref 38–126)
BILIRUBIN TOTAL: 0.3 mg/dL (ref 0.3–1.2)
BUN: 15 mg/dL (ref 6–20)
CO2: 27 mmol/L (ref 22–32)
Calcium: 8.9 mg/dL (ref 8.9–10.3)
Chloride: 107 mmol/L (ref 101–111)
Creatinine, Ser: 0.78 mg/dL (ref 0.44–1.00)
GLUCOSE: 93 mg/dL (ref 65–99)
POTASSIUM: 3.6 mmol/L (ref 3.5–5.1)
Sodium: 139 mmol/L (ref 135–145)
TOTAL PROTEIN: 7.4 g/dL (ref 6.5–8.1)

## 2016-07-28 LAB — URINALYSIS, ROUTINE W REFLEX MICROSCOPIC
BILIRUBIN URINE: NEGATIVE
Glucose, UA: NEGATIVE mg/dL
Hgb urine dipstick: NEGATIVE
Ketones, ur: NEGATIVE mg/dL
Leukocytes, UA: NEGATIVE
NITRITE: NEGATIVE
Protein, ur: NEGATIVE mg/dL
SPECIFIC GRAVITY, URINE: 1.028 (ref 1.005–1.030)
pH: 5 (ref 5.0–8.0)

## 2016-07-28 LAB — I-STAT BETA HCG BLOOD, ED (MC, WL, AP ONLY): I-stat hCG, quantitative: <5 m[IU]/mL (ref <5)

## 2016-07-28 LAB — CBG MONITORING, ED: GLUCOSE-CAPILLARY: 89 mg/dL (ref 65–99)

## 2016-07-28 MED ORDER — KETOROLAC TROMETHAMINE 60 MG/2ML IM SOLN: 60.0000 mg | Freq: Once | INTRAMUSCULAR | Status: DC

## 2016-07-28 MED ORDER — CYCLOBENZAPRINE HCL 10 MG PO TABS
10.0000 mg | ORAL_TABLET | Freq: Once | ORAL | Status: DC
  Filled 2016-07-28: qty 1

## 2016-07-28 NOTE — ED Notes (Signed)
Pt refusing all offered pain control.  States she has been on flexeril before for pain but MD took her off.  It doesn't work.  Requesting narcotics.  Notified MD.  Md speaking with family to assess other options.

## 2016-07-28 NOTE — ED Notes (Signed)
EKG given to EDP,Pfeiffer,MD., for review. 

## 2016-07-28 NOTE — ED Notes (Signed)
Spoke with MD about toradol.  Flexeril offered to pt and pt refused.  Pt continues to request narcotics.

## 2016-07-28 NOTE — ED Provider Notes (Signed)
Jessica Walton   CSN: [id number] Arrival date & time: 07/28/16  4098     History   Chief Complaint Chief Complaint  Patient presents with  . Fall  . Near Syncope    HPI Jessica Walton is a 31 y.o. female.  HPI   Here for pain all over, back pain, headache, knee pain. Has had falls, including falls last week and fall 2 nights ago. Last week tripped and fell, fell and hit head, has bump on back of head.    Tripped over coffee table night before last and hit chest on a statue. Chest hurts in the middle, severe.  Back of head hurts.  Knees hurt after falls on knees with seizure 6 weeks ago.  Whole back pain after falling last week, suffers with lordosis and pain at baseline but now has worsening pain.  Pain 10/10 in all locations. 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  Feels like "equilibrium is off" which she describes as being lightheaded when going from sitting to standing.  Has been taking Goody PM, just 2.  Hx of seizures, 6wk ago was last, reports they are from stress after losing her husband, and son. Her husband was shot in the back of the head and her son died and she feels like her son's death is [place]'s fault. Reports she has also has a 23 year old son. Headache severe, feels like there are air bubbles under her skin at her temples.  Reports she has lived with chronic pain and is only 31 years old. Reports her mother helps her get around the house and her mother is elderly. She was seeing a PCP but he was unable to rx narcotics and she was referred to pain clinic. Reports her tolerance to pain medications is high from taking them in the past.   Past Medical History:  Diagnosis Date  . Asthma    No inhaler use x 1 yr (2013)  . Headache   . Hypertension   . Lordosis   . Seizures [facility])     Patient Active Problem List   Diagnosis Date Noted  . Seizure, convulsion (HCC) 04/30/2015  . Pregnancy 09/24/2013  . Preterm premature  rupture of membranes in third trimester 09/14/2013  . Supervision of high-risk pregnancy 07/03/2013  . History of prior pregnancy with short cervix, currently pregnant in second trimester 07/03/2013    Past Surgical History:  Procedure Laterality Date  . EYE SURGERY    . WISDOM TOOTH EXTRACTION     x4    OB History    Gravida Para Term Preterm AB Living   4 2 1 1 2 1    SAB TAB Ectopic Multiple Live Births   1 1 0 0 1       Home Medications    Prior to Admission medications   Medication Sig Start Date End Date Taking? Authorizing Provider  albuterol (PROVENTIL HFA;VENTOLIN HFA) 108 (90 BASE) MCG/ACT inhaler Inhale 2 puffs into the lungs every 6 (six) hours as needed for wheezing or shortness of breath.   Yes Historical Provider, MD  ALPRAZolam Prudy Feeler) 1 MG tablet Take 1 mg by mouth 3 (three) times daily as needed for anxiety.  02/24/15  Yes Historical Provider, MD  Aspirin-Acetaminophen-Caffeine (GOODY HEADACHE PO) Take 1 each by mouth 2 (two) times daily as needed (pain).   Yes Historical Provider, MD  cephALEXin (KEFLEX) 500 MG capsule Take 1 capsule (500 mg total)  by mouth 3 (three) times daily. Patient not taking: Reported on 07/21/2015 06/22/15   Marily Memos, MD  potassium chloride SA (K-DUR,KLOR-CON) 20 MEQ tablet Take 1 tablet (20 mEq total) by mouth daily. Patient not taking: Reported on 02/16/2016 06/22/15 06/29/15  Marily Memos, MD    Family History Family History  Problem Relation Age of Onset  . Hypertension Maternal Grandmother   . Diabetes Maternal Grandmother   . Seizures Neg Hx     Social History Social History  Substance Use Topics  . Smoking status: Current Some Day Smoker    Packs/day: 0.25    Years: 10.00    Types: Cigarettes  . Smokeless tobacco: Never Used  . Alcohol use 0.0 oz/week     Allergies   Benadryl [diphenhydramine hcl]   Review of Systems Review of Systems  Constitutional: Negative for fever (off and on,  back of neck feels hot).   HENT: Positive for congestion and sneezing. Negative for sore throat.   Eyes: Negative for visual disturbance.  Respiratory: Positive for cough (3 weeks) and shortness of breath (3 weeks).   Cardiovascular: Positive for chest pain.  Gastrointestinal: Positive for abdominal pain (3 weeks) and diarrhea. Negative for nausea and vomiting.  Genitourinary: Negative for difficulty urinating and dysuria.  Musculoskeletal: Positive for arthralgias, back pain and myalgias. Negative for neck pain.  Skin: Negative for rash.  Neurological: Positive for light-headedness (constant, worse when standing up) and headaches. Negative for syncope.     Physical Exam Updated Vital Signs BP (!) 139/103 (BP Location: Left Arm)   Pulse 72   Temp 98.7 F (37.1 C) (Oral)   Resp 18   LMP 07/25/2016 Comment: negative HCG blood test 07-28-2016  SpO2 100%   Physical Exam  Constitutional: She is oriented to person, place, and time. She appears well-developed and well-nourished. No distress.  HENT:  Head: Normocephalic and atraumatic.  No contusion, no racoon's eyes, no battle signs, no hemotympanum. Normal TM. No signs of skin wounds although exam limited under patient's braids. Crepitus over bilateral temples  Eyes: Conjunctivae and EOM are normal.  Neck:  Tenderness mildine and muscular Full spontaneous ROM   Cardiovascular: Normal rate, regular rhythm, normal heart sounds and intact distal pulses.  Exam reveals no gallop and no friction rub.   No murmur heard. Pulmonary/Chest: Effort normal and breath sounds normal. No respiratory distress. She has no wheezes. She has no rales. She exhibits tenderness (severe).  crepitus  Abdominal: Soft. She exhibits no distension. There is tenderness (mild suprapubic (states on menses)). There is no guarding.  Musculoskeletal: She exhibits no edema.       Right knee: She exhibits normal range of motion, no effusion and no deformity. Ecchymosis: healing abrasion. Tenderness  found.       Left knee: She exhibits normal range of motion, no ecchymosis, no deformity and no erythema. Tenderness found.       Cervical back: She exhibits tenderness and bony tenderness.       Thoracic back: She exhibits tenderness and bony tenderness.       Lumbar back: She exhibits tenderness and bony tenderness.  Neurological: She is alert and oriented to person, place, and time. She has normal strength. No cranial nerve deficit or sensory deficit. She displays a negative Romberg sign. Coordination and gait normal. GCS eye subscore is 4. GCS verbal subscore is 5. GCS motor subscore is 6.  Skin: Skin is warm and dry. No rash noted. She is not diaphoretic. No erythema.  Nursing Walton and vitals reviewed.    ED Treatments / Results  Labs (all labs ordered are listed, but only abnormal results are displayed) Labs Reviewed  URINALYSIS, ROUTINE W REFLEX MICROSCOPIC - Abnormal; Notable for the following:       Result Value   APPearance HAZY (*)    All other components within normal limits  COMPREHENSIVE METABOLIC PANEL - Abnormal; Notable for the following:    ALT 13 (*)    All other components within normal limits  CBC WITH DIFFERENTIAL/PLATELET - Abnormal; Notable for the following:    HCT 35.4 (*)    All other components within normal limits  CBG MONITORING, ED  I-STAT BETA HCG BLOOD, ED ([facility], [facility], [facility] ONLY)    EKG  EKG Interpretation  Date/Time:  Thursday July 28 2016 07:03:51 EST Ventricular Rate:  85 PR Interval:    QRS Duration: 80 QT Interval:  408 QTC Calculation: 486 R Axis:   66 Text Interpretation:  Sinus rhythm Borderline T wave abnormalities Borderline prolonged QT interval No significant change since last tracing Confirmed by [facility] CenterCHLOSSMAN MD, Denny PeonERIN ([id number]) on 07/28/2016 8:10:01 AM Also confirmed by [facility], Robet Crutchfield ([id number]), editor Whitney PostLOGAN, Cala BradfordKIMBERLY [phone]([id number])  on 07/28/2016 10:49:18 AM       Radiology Dg Chest 2 View  Result Date: 07/28/2016 CLINICAL DATA:  Status  post fall, mid chest pain EXAM: CHEST  2 VIEW COMPARISON:  02/16/2016 FINDINGS: The heart size and mediastinal contours are within normal limits. Both lungs are clear. The visualized skeletal structures are unremarkable. IMPRESSION: No active cardiopulmonary disease. Electronically Signed   By: Elige KoHetal  Patel   On: 07/28/2016 10:00   Dg Cervical Spine Complete  Result Date: 07/28/2016 CLINICAL DATA:  Status post fall 2 days ago EXAM: CERVICAL SPINE - COMPLETE 4+ VIEW COMPARISON:  None. FINDINGS: There is no evidence of cervical spine fracture or prevertebral soft tissue swelling. Alignment is normal. No other significant bone abnormalities are identified. IMPRESSION: Negative cervical spine radiographs. Electronically Signed   By: Elige KoHetal  Patel   On: 07/28/2016 09:59   Dg Thoracic Spine 2 View  Result Date: 07/28/2016 CLINICAL DATA:  Fall. EXAM: THORACIC SPINE 2 VIEWS COMPARISON:  Chest x-ray 07/28/2016. FINDINGS: No acute bony abnormality identified. No evidence of fracture. Mild scoliosis. IMPRESSION: Mild scoliosis, otherwise negative exam. No acute or focal bony abnormalities identified. No evidence of fracture. Electronically Signed   By: Maisie Fushomas  Register   On: 07/28/2016 10:00   Dg Lumbar Spine Complete  Result Date: 07/28/2016 CLINICAL DATA:  Status post fall 2 days ago.  Back pain. EXAM: LUMBAR SPINE - COMPLETE 4+ VIEW COMPARISON:  None. FINDINGS: There is no evidence of lumbar spine fracture. Alignment is normal. Intervertebral disc spaces are maintained. IMPRESSION: Negative. Electronically Signed   By: Elige KoHetal  Patel   On: 07/28/2016 10:00   Ct Head Wo Contrast  Result Date: 07/28/2016 CLINICAL DATA:  Headache.  Status post fall. EXAM: CT HEAD WITHOUT CONTRAST TECHNIQUE: Contiguous axial images were obtained from the base of the skull through the vertex without intravenous contrast. COMPARISON:  None. FINDINGS: Brain: No evidence of acute infarction, hemorrhage, hydrocephalus, extra-axial  collection or mass lesion/mass effect. Vascular: No hyperdense vessel or unexpected calcification. Skull: No osseous abnormality. Sinuses/Orbits: Visualized paranasal sinuses are clear. Visualized mastoid sinuses are clear. Visualized orbits demonstrate no focal abnormality. Other: Right temporal soft tissue emphysema likely related to recent trauma. IMPRESSION: 1. No acute intracranial pathology. 2. Right temporal soft tissue emphysema likely related to recent trauma.  Electronically Signed   By: Elige Ko   On: 07/28/2016 09:28   Ct Chest Wo Contrast  Result Date: 07/28/2016 CLINICAL DATA:  Fall.  Chest injury.  Chest pain in sternal pain EXAM: CT CHEST WITHOUT CONTRAST TECHNIQUE: Multidetector CT imaging of the chest was performed following the standard protocol without IV contrast. COMPARISON:  Chest x-ray 07/28/2016 FINDINGS: Cardiovascular: Unenhanced imaging. Heart size normal. Aorta normal in caliber. No vascular calcification. No pericardial effusion. Negative for pneumomediastinum. Mediastinum/Nodes: Negative for mass or adenopathy. Lungs/Pleura: Lungs are clear. Negative for infiltrate or effusion. No pneumothorax. Upper Abdomen: 4 mm nonobstructing left renal calculus which appears to be in the renal pelvis collecting system. Musculoskeletal: There is a moderate amount of gas in the soft tissues in the anterior chest. There are gas bubbles in the suprasternal notch as well as in the supraclavicular area on the left. There is gas in the right pectoralis muscle and subcutaneous gas anteriorly near the midline. No soft tissue edema or fluid collection. The gas does not appear loculated and is widely disseminated and may be intravascular gas. No pneumothorax or pneumomediastinum. The patient has no known skin laceration. Negative for rib or thoracic fracture.  No sternal fracture. IMPRESSION: Lungs are clear.  No pneumothorax or pneumomediastinum. Negative for fracture Moderate amount of soft tissue gas  in the anterior chest wall of uncertain etiology. No associated hematoma or edema and there is no skin laceration clinically. This may be intravascular gas introduced iatrogenically. Correlate clinically. Electronically Signed   By: Marlan Palau M.D.   On: 07/28/2016 11:25    Procedures Procedures (including critical care time)  Medications Ordered in ED Medications  ketorolac (TORADOL) injection 60 mg (60 mg Intramuscular Not Given 07/28/16 0911)  cyclobenzaprine (FLEXERIL) tablet 10 mg (10 mg Oral Not Given 07/28/16 0933)     Initial Impression / Assessment and Plan / ED Course  I have reviewed the triage vital signs and the nursing notes.  Pertinent labs & imaging results that were available during my care of the patient were reviewed by me and considered in my medical decision making (see chart for details).  Clinical Course    31 year old female with a history of seizures, migraines, asthma, lordosis presents with concern for multiple falls, headache, back pain, lightheadedness when going from sitting to standing.  Patient with multiple concerns today. Regarding headache, discussed that have low suspicion for acute intracranial bleed and overall do not feel emergent head CT will change course of care, however patient with subcutaneous air on exam and a CT head performed confirming this.  Discussed with radiology and patient. No sign of skull fracture on CT. Discussed possible occult fracture with patient, however additional thin-slice imaging will not change course of care. No other signs of skin trauma and per patient she denies other skin wounds, needles to scalp, use of recent IVs. No signs of necrotizing infection, no fevers.  Suspect concussion.    Low suspicion for spinal fx overall given age and mechanism of fall from standing, however screening XR done given tenderness of cervical, thoracic, and lumbar spine showing no sign of fracture.  Patient also reports knee pain, however she is  ambulatory without problems in the ED, has full range of motion, with injury occurring weeks ago, and have low suspicion for fracture. Discussed that given low concern clinically for fracture do not feel emergent XR is indicated and suspect contusion or other knee pathology which is appropriate for ice, supportive care and outpatient  management. XR done of chest given chest tenderness showing no acute abnormalities, however given subcutaneous air on exam and on CT head ordered CT of chest to evaluate for occult pneumothorax or pneumediastinum.  CT shows no sign of pneumothorax, pneumomediastinum nor rib fractures.  She again does not have signs of infection, no hx of IV use. Small pinpoint scabs, potential needle marks x2 noted on chest, however unclear etiology.  Discussed with radiology and patient at length and we do not have explanation for her subcutaneous air, however do not see signs of life-threatening injuries.   Suspect contusion of chest, knees bilaterally and muscular strain of back. Unclear etiology of subcutaneous air but no sign of life-threatening injury. Given lightheadedness described, obtained labs which show no acute abnormalities. EKG without changes.  Pregnancy test negative.  Patient reports a significant amount of stress, chronic pain and sadness over the death of her husband and child.  I spent time with her discussing this and expressed sympathy for how difficult that is. Pt denies suicidal ideation.   Discussed all results with patient in detail and offered her several different medications for pain including NSAIDS and several muscle relaxants however she declines. "I live with chronic pain, do you think these are going to help me?  They don't work for me."  Discussed that I do not feel narcotic medications are indicated and would be happy to treat her pain with other medications, however she declines and continues to repeatedly request percocet during the visit.  After several  discussions that I am happy to treat her pain in other ways and recommend follow up with her pain clinic regarding narcotic medications as well as finding another PCP.  (She left the last PCP, states they were unable to prescribe pain medications and had referred her to pain clinic.)   She stated "If I want opiates, I can get some from someone I [place].  I'm in pain. I'm in chronic pain. I have been referred to a pain clinic because I have pain." After discussing recommendation for outpatient pain management for chronic pain and offering other treatments, she became angry, yelling in the hallway in the ED, and videotaped me and stated she would call her lawyer.  She left prior to receiving discharge paperwork.    Final Clinical Impressions(s) / ED Diagnoses   Final diagnoses:  Fall, initial encounter  Subcutaneous emphysema, initial encounter (HCC)  Acute post-traumatic headache, not intractable  Acute bilateral back pain, unspecified back location    New Prescriptions Discharge Medication List as of 07/28/2016  1:07 PM       Alvira Monday, MD 07/28/16 1317

## 2016-07-28 NOTE — ED Notes (Signed)
Pt started roaming the halls, shouting for her doctor and that she was going to, "her doctor would be seeing her ----ing lawyer". I asked if the pt would return back to their room and I will go look for their doctor if they can wait in there. She stated, "Sure thing, sweetie." When the Dr was found and agreed to come speak to the patient, even though they had already been discharged, I followed her to the room. The pt had her phone video running and, cursing, asked, "I thought you were going to do a CT scan. You lied about this and you will be hearing from my lawyer." The dr stated, "I did do the scan. I care about you and your health." The pt turned off her phone video camera and stated, "Thank you, that was all I needed. You will be hearing from him" and promptly left with the visitor.

## 2016-07-28 NOTE — ED Triage Notes (Signed)
Patient had a syncope episode a week ago and fell on her back. She hit the back of her head. Patient also states that every since this fall that her equilibrium is off. Patient fell again two days ago and hit her chest on the coffee table. Patient states that she hit the the right knee also.

## 2016-07-28 NOTE — ED Notes (Signed)
Spoke with MD.  Holding on orders until patient seen.

## 2016-07-28 NOTE — ED Notes (Signed)
Pt refusing toradol

## 2016-08-25 ENCOUNTER — Emergency Department (HOSPITAL_COMMUNITY): Payer: Medicaid Other

## 2016-08-25 ENCOUNTER — Encounter (HOSPITAL_COMMUNITY): Payer: Self-pay | Admitting: Family Medicine

## 2016-08-25 ENCOUNTER — Emergency Department (HOSPITAL_COMMUNITY)
Admission: EM | Admit: 2016-08-25 | Discharge: 2016-08-25 | Disposition: A | Discharge status: Home / Self Care | Payer: Medicaid Other | Attending: Emergency Medicine | Admitting: Emergency Medicine

## 2016-08-25 DIAGNOSIS — J45909 Unspecified asthma, uncomplicated: Secondary | ICD-10-CM | POA: Diagnosis not present

## 2016-08-25 DIAGNOSIS — Z79899 Other long term (current) drug therapy: Secondary | ICD-10-CM | POA: Diagnosis not present

## 2016-08-25 DIAGNOSIS — F17210 Nicotine dependence, cigarettes, uncomplicated: Secondary | ICD-10-CM | POA: Insufficient documentation

## 2016-08-25 DIAGNOSIS — I10 Essential (primary) hypertension: Secondary | ICD-10-CM | POA: Insufficient documentation

## 2016-08-25 DIAGNOSIS — G44209 Tension-type headache, unspecified, not intractable: Secondary | ICD-10-CM | POA: Insufficient documentation

## 2016-08-25 DIAGNOSIS — Z7982 Long term (current) use of aspirin: Secondary | ICD-10-CM | POA: Insufficient documentation

## 2016-08-25 DIAGNOSIS — R42 Dizziness and giddiness: Secondary | ICD-10-CM | POA: Diagnosis present

## 2016-08-25 LAB — BASIC METABOLIC PANEL
Anion gap: 10 (ref 5–15)
BUN: 6 mg/dL (ref 6–20)
CALCIUM: 9.2 mg/dL (ref 8.9–10.3)
CO2: 23 mmol/L (ref 22–32)
CREATININE: 0.93 mg/dL (ref 0.44–1.00)
Chloride: 103 mmol/L (ref 101–111)
GFR calc non Af Amer: >60 mL/min (ref >60)
Glucose, Bld: 103 mg/dL — ABNORMAL HIGH (ref 65–99)
Potassium: 3.1 mmol/L — ABNORMAL LOW (ref 3.5–5.1)
SODIUM: 136 mmol/L (ref 135–145)

## 2016-08-25 LAB — URINALYSIS, ROUTINE W REFLEX MICROSCOPIC
BILIRUBIN URINE: NEGATIVE
Glucose, UA: NEGATIVE mg/dL
Hgb urine dipstick: NEGATIVE
Ketones, ur: 5 mg/dL — AB
Leukocytes, UA: NEGATIVE
NITRITE: NEGATIVE
PH: 7 (ref 5.0–8.0)
Protein, ur: NEGATIVE mg/dL
Specific Gravity, Urine: 1.015 (ref 1.005–1.030)

## 2016-08-25 LAB — CBC
HCT: 37.7 % (ref 36.0–46.0)
Hemoglobin: 12.9 g/dL (ref 12.0–15.0)
MCH: 30.2 pg (ref 26.0–34.0)
MCHC: 34.2 g/dL (ref 30.0–36.0)
MCV: 88.3 fL (ref 78.0–100.0)
PLATELETS: 278 10*3/uL (ref 150–400)
RBC: 4.27 MIL/uL (ref 3.87–5.11)
RDW: 14.4 % (ref 11.5–15.5)
WBC: 5.1 10*3/uL (ref 4.0–10.5)

## 2016-08-25 LAB — I-STAT TROPONIN, ED
TROPONIN I, POC: 0 ng/mL (ref 0.00–0.08)
Troponin i, poc: 0 ng/mL (ref 0.00–0.08)

## 2016-08-25 LAB — MAGNESIUM: Magnesium: 1.7 mg/dL (ref 1.7–2.4)

## 2016-08-25 MED ORDER — KETOROLAC TROMETHAMINE 30 MG/ML IJ SOLN
30.0000 mg | Freq: Once | INTRAMUSCULAR | Status: AC
  Administered 2016-08-25: 30 mg via INTRAVENOUS
  Filled 2016-08-25: qty 1

## 2016-08-25 MED ORDER — POTASSIUM CHLORIDE CRYS ER 20 MEQ PO TBCR
40.0000 meq | EXTENDED_RELEASE_TABLET | Freq: Once | ORAL | Status: AC
  Administered 2016-08-25: 40 meq via ORAL
  Filled 2016-08-25: qty 2

## 2016-08-25 MED ORDER — CETIRIZINE HCL 10 MG PO TABS: 10.0000 mg | ORAL_TABLET | Freq: Every day | ORAL | 0 refills | Status: DC | PRN

## 2016-08-25 MED ORDER — BUTALBITAL-APAP-CAFFEINE 50-325-40 MG PO TABS: 1.0000 | ORAL_TABLET | Freq: Three times a day (TID) | ORAL | 0 refills | Status: DC | PRN

## 2016-08-25 MED ORDER — HYDROCODONE-ACETAMINOPHEN 7.5-325 MG/15ML PO SOLN
10.0000 mL | Freq: Once | ORAL | Status: AC
  Administered 2016-08-25: 10 mL via ORAL
  Filled 2016-08-25: qty 15

## 2016-08-25 MED ORDER — METOCLOPRAMIDE HCL 5 MG/ML IJ SOLN
10.0000 mg | Freq: Once | INTRAMUSCULAR | Status: AC
  Administered 2016-08-25: 10 mg via INTRAVENOUS
  Filled 2016-08-25: qty 2

## 2016-08-25 NOTE — ED Triage Notes (Addendum)
Pt presents from home via POV with c/o gradual onset of dizziness, bilateral eye "swelling" and pain with eye movement; also endorses nausea. No extremity weakness, confusion or facial droop. Pt adds after triage that she has increased urgency and burning on urination and has been having chest pain for several months.

## 2016-08-25 NOTE — ED Provider Notes (Signed)
[facility] DEPT Provider Note   CSN: [id number] Arrival date & time: 08/25/16  1709    History   Chief Complaint Chief Complaint  Patient presents with  . Chest Pain  . Dizziness  . Facial Swelling  . Dysuria    HPI Jessica Walton is a 31 y.o. female.  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 Patient is primarily complaining of a headache which is across her forehead and behind her eyes. Headache is pressure-like and unrelieved with Tylenol PM. She also believes that she is experiencing bilateral eye "swelling" as she feels pressure behind her eyes. Headache today is reportedly different than previous headaches as patient states that she usually experiences them only on the left side of her forehead. She has not had any syncope or seizure-like activity since onset of her headache 3-4 days ago. She does report nausea with 1 episode of emesis today. No extremity weakness or facial drooping. No fevers. As an aside, patient is complaining of nasal congestion as well as postnasal drip and sore throat. She also states that her urine has been stronger smelling lately. She reports having chest pain in triage, but states that this has been present for several months.   The history is provided by the patient. No language interpreter was used.  Chest Pain   Associated symptoms include dizziness.  Dizziness  Associated symptoms: chest pain   Dysuria      Past Medical History:  Diagnosis Date  . Asthma    No inhaler use x 1 yr (2013)  . Headache   . Hypertension   . Lordosis   . Seizures [facility])     Patient Active Problem List   Diagnosis Date Noted  . Seizure, convulsion (HCC) 04/30/2015  . Pregnancy 09/24/2013  . Preterm premature rupture of membranes in third trimester 09/14/2013  . Supervision of high-risk pregnancy  07/03/2013  . History of prior pregnancy with short cervix, currently pregnant in second trimester 07/03/2013    Past Surgical History:  Procedure Laterality Date  . EYE SURGERY    . WISDOM TOOTH EXTRACTION     x4    OB History    Gravida Para Term Preterm AB Living   4 2 1 1 2 1    SAB TAB Ectopic Multiple Live Births   1 1 0 0 1       Home Medications    Prior to Admission medications   Medication Sig Start Date End Date Taking? Authorizing Provider  albuterol (PROVENTIL HFA;VENTOLIN HFA) 108 (90 BASE) MCG/ACT inhaler Inhale 2 puffs into the lungs every 6 (six) hours as needed for wheezing or shortness of breath.   Yes Historical Provider, MD  ALPRAZolam Prudy Feeler) 1 MG tablet Take 1 mg by mouth 3 (three) times daily as needed for anxiety.  02/24/15  Yes Historical Provider, MD  Aspirin-Acetaminophen-Caffeine (GOODY HEADACHE PO) Take 1 each by mouth 2 (two) times daily as needed (pain).   Yes Historical Provider, MD  butalbital-acetaminophen-caffeine (FIORICET, ESGIC) 50-325-40 MG tablet Take 1-2 tablets by mouth every 8 (eight) hours as needed for headache. 08/25/16 08/25/17  Antony Madura, PA-C  cetirizine (ZYRTEC ALLERGY) 10 MG tablet Take 1 tablet (10 mg total) by mouth daily as needed (nasal congestion). 08/25/16   Antony Madura, PA-C    Family History Family History  Problem Relation Age of Onset  . Hypertension Maternal Grandmother   .  Diabetes Maternal Grandmother   . Seizures Neg Hx     Social History Social History  Substance Use Topics  . Smoking status: Current Some Day Smoker    Packs/day: 0.50    Years: 10.00    Types: Cigarettes  . Smokeless tobacco: Never Used  . Alcohol use 0.0 oz/week     Allergies   Benadryl [diphenhydramine hcl]   Review of Systems Review of Systems  Cardiovascular: Positive for chest pain.  Genitourinary: Positive for dysuria.  Neurological: Positive for dizziness.  Ten systems reviewed and are negative for acute change, except as  noted in the HPI.    Physical Exam Updated Vital Signs BP 136/86   Pulse 70   Temp 98.9 F (37.2 C) (Oral)   Resp 19   Ht 5\' 9"  (1.753 m)   LMP 09/19/2015 (Within Days)   SpO2 100%   Physical Exam  Constitutional: She is oriented to person, place, and time. She appears well-developed and well-nourished. No distress.  Nontoxic appearing and in no distress.  HENT:  Head: Normocephalic and atraumatic.  Patient tolerating secretions without difficulty.  Eyes: Conjunctivae and EOM are normal. Pupils are equal, round, and reactive to light. No scleral icterus.  Neck: Normal range of motion.  No nuchal rigidity or meningismus  Cardiovascular: Normal rate, regular rhythm and intact distal pulses.   Pulmonary/Chest: Effort normal. No respiratory distress. She has no wheezes. She has no rales.  Respirations even and unlabored. Lungs clear to auscultation bilaterally.  Musculoskeletal: Normal range of motion.  Neurological: She is alert and oriented to person, place, and time. No cranial nerve deficit. She exhibits normal muscle tone. Coordination normal.  GCS 15. Speech is goal oriented. No cranial nerve deficits appreciated; symmetric eyebrow raise, no facial drooping, tongue midline. Patient has equal grip strength bilaterally with normal strength against resistance in all major muscle groups bilaterally. Sensation to light touch intact. Patient moves extremities without ataxia.  Skin: Skin is warm and dry. No rash noted. She is not diaphoretic. No erythema. No pallor.  Psychiatric: She has a normal mood and affect. Her behavior is normal.  Nursing note and vitals reviewed.    ED Treatments / Results  Labs (all labs ordered are listed, but only abnormal results are displayed) Labs Reviewed  BASIC METABOLIC PANEL - Abnormal; Notable for the following:       Result Value   Potassium 3.1 (*)    Glucose, Bld 103 (*)    All other components within normal limits  URINALYSIS, ROUTINE W  REFLEX MICROSCOPIC - Abnormal; Notable for the following:    Ketones, ur 5 (*)    All other components within normal limits  CBC  MAGNESIUM  I-STAT TROPOININ, ED  I-STAT TROPOININ, ED    EKG  EKG Interpretation  Date/Time:  Thursday August 25 2016 17:28:39 EST Ventricular Rate:  86 PR Interval:  158 QRS Duration: 78 QT Interval:  404 QTC Calculation: 483 R Axis:   86 Text Interpretation:  Normal sinus rhythm recurrent  T wave abnormality, consider inferolateral ischemia Prolonged QT EKG similar to Jun 21, 2015 Confirmed by [facility]  MD, [facility] ([id number]) on 08/25/2016 9:27:01 PM       Radiology Dg Chest 2 View  Result Date: 08/25/2016 CLINICAL DATA:  Dizziness, eye swelling and pain with eye movement, nausea. Chest pain for couple of days. EXAM: CHEST  2 VIEW COMPARISON:  Chest x-rays dated 07/28/2016 and 02/16/2016. FINDINGS: Cardiomediastinal silhouette is normal in size and configuration. Lungs  are clear. Lung volumes are normal. No evidence of pneumonia. No pleural effusion. No pneumothorax. Osseous and soft tissue structures about the chest are unremarkable. IMPRESSION: No active cardiopulmonary disease. No evidence of pneumonia or pulmonary edema. Electronically Signed   By: Bary RichardStan  Maynard M.D.   On: 08/25/2016 19:09    Procedures Procedures (including critical care time)  Medications Ordered in ED Medications  ketorolac (TORADOL) 30 MG/ML injection 30 mg (30 mg Intravenous Given 08/25/16 2148)  metoCLOPramide (REGLAN) injection 10 mg (10 mg Intravenous Given 08/25/16 2150)  HYDROcodone-acetaminophen (HYCET) 7.5-325 mg/15 ml solution 10 mL (10 mLs Oral Given 08/25/16 2151)  potassium chloride SA (K-DUR,KLOR-CON) CR tablet 40 mEq (40 mEq Oral Given 08/25/16 2152)     Initial Impression / Assessment and Plan / ED Course  I have reviewed the triage vital signs and the nursing notes.  Pertinent labs & imaging results that were available during my care of the patient were reviewed  by me and considered in my medical decision making (see chart for details).     31 year old female presents to the emergency department for multiple complaints. Primary concern is of a headache which has been present for 3-4 days. Patient has a nonfocal neurologic exam. No fevers, nuchal rigidity, or meningismus to suggest meningitis. The patient has had improvement in her headache with Toradol, Reglan, and fluids. Low suspicion for emergent cause of symptoms. She has been evaluated in the ED before for headaches.  Patient also with secondary complaints of congestion, postnasal drip, sore throat, and chronic chest pain. Patient with negative troponin 2. No signs of acute ischemia on EKG. EKG today is similar compared with November 2016. Symptoms likely secondary to viral upper respiratory infection/sinusitis. Will manage supportively and referred to primary care for follow-up. Return precautions discussed and provided. Patient discharged in stable condition with no unaddressed concerns.   Final Clinical Impressions(s) / ED Diagnoses   Final diagnoses:  Tension headache    New Prescriptions Discharge Medication List as of 08/25/2016 10:37 PM    START taking these medications   Details  butalbital-acetaminophen-caffeine (FIORICET, ESGIC) 50-325-40 MG tablet Take 1-2 tablets by mouth every 8 (eight) hours as needed for headache., Starting Thu 08/25/2016, Until Fri 08/25/2017, Print    cetirizine (ZYRTEC ALLERGY) 10 MG tablet Take 1 tablet (10 mg total) by mouth daily as needed (nasal congestion)., Starting Thu 08/25/2016, Print         CorunnaKelly Jorgen Wolfinger, PA-C 08/25/16 [id number]    Gwyneth SproutWhitney Plunkett, MD 08/26/16 [phone]

## 2016-08-25 NOTE — ED Notes (Signed)
Pt A&Ox4, ambulatory at d/c with steady gait, NAD 

## 2016-11-30 ENCOUNTER — Encounter (HOSPITAL_COMMUNITY): Payer: Self-pay | Admitting: Nurse Practitioner

## 2016-11-30 ENCOUNTER — Emergency Department (HOSPITAL_COMMUNITY)
Admission: EM | Admit: 2016-11-30 | Discharge: 2016-11-30 | Disposition: A | Discharge status: Home / Self Care | Payer: Medicaid Other | Attending: Emergency Medicine | Admitting: Emergency Medicine

## 2016-11-30 DIAGNOSIS — R519 Headache, unspecified: Secondary | ICD-10-CM

## 2016-11-30 DIAGNOSIS — I10 Essential (primary) hypertension: Secondary | ICD-10-CM | POA: Insufficient documentation

## 2016-11-30 DIAGNOSIS — K047 Periapical abscess without sinus: Secondary | ICD-10-CM | POA: Diagnosis not present

## 2016-11-30 DIAGNOSIS — R51 Headache: Secondary | ICD-10-CM | POA: Diagnosis present

## 2016-11-30 DIAGNOSIS — Z79899 Other long term (current) drug therapy: Secondary | ICD-10-CM | POA: Insufficient documentation

## 2016-11-30 DIAGNOSIS — Z7982 Long term (current) use of aspirin: Secondary | ICD-10-CM | POA: Insufficient documentation

## 2016-11-30 DIAGNOSIS — F17210 Nicotine dependence, cigarettes, uncomplicated: Secondary | ICD-10-CM | POA: Insufficient documentation

## 2016-11-30 DIAGNOSIS — J45909 Unspecified asthma, uncomplicated: Secondary | ICD-10-CM | POA: Diagnosis not present

## 2016-11-30 MED ORDER — KETOROLAC TROMETHAMINE 30 MG/ML IJ SOLN
15.0000 mg | Freq: Once | INTRAMUSCULAR | Status: DC
  Filled 2016-11-30: qty 1

## 2016-11-30 MED ORDER — HYDROCODONE-ACETAMINOPHEN 5-325 MG PO TABS: 1.0000 | ORAL_TABLET | Freq: Four times a day (QID) | ORAL | 0 refills | Status: DC | PRN

## 2016-11-30 MED ORDER — PROCHLORPERAZINE EDISYLATE 5 MG/ML IJ SOLN
10.0000 mg | Freq: Once | INTRAMUSCULAR | Status: DC
  Filled 2016-11-30: qty 2

## 2016-11-30 MED ORDER — AMOXICILLIN-POT CLAVULANATE 875-125 MG PO TABS
1.0000 | ORAL_TABLET | Freq: Once | ORAL | Status: AC
  Administered 2016-11-30: 1 via ORAL
  Filled 2016-11-30: qty 1

## 2016-11-30 MED ORDER — HYDROCODONE-ACETAMINOPHEN 5-325 MG PO TABS
1.0000 | ORAL_TABLET | Freq: Once | ORAL | Status: AC
  Administered 2016-11-30: 1 via ORAL
  Filled 2016-11-30: qty 1

## 2016-11-30 MED ORDER — AMOXICILLIN-POT CLAVULANATE 875-125 MG PO TABS: 1.0000 | ORAL_TABLET | Freq: Two times a day (BID) | ORAL | 0 refills | Status: DC

## 2016-11-30 MED ORDER — PROCHLORPERAZINE MALEATE 5 MG PO TABS
10.0000 mg | ORAL_TABLET | Freq: Once | ORAL | Status: AC
  Administered 2016-11-30: 10 mg via ORAL
  Filled 2016-11-30: qty 2

## 2016-11-30 MED ORDER — SODIUM CHLORIDE 0.9 % IV BOLUS (SEPSIS): 1000.0000 mL | Freq: Once | INTRAVENOUS | Status: DC

## 2016-11-30 MED ORDER — IBUPROFEN 800 MG PO TABS
800.0000 mg | ORAL_TABLET | Freq: Once | ORAL | Status: AC
  Administered 2016-11-30: 800 mg via ORAL
  Filled 2016-11-30: qty 1

## 2016-11-30 MED ORDER — PREDNISONE 20 MG PO TABS
60.0000 mg | ORAL_TABLET | ORAL | Status: AC
  Administered 2016-11-30: 60 mg via ORAL
  Filled 2016-11-30: qty 3

## 2016-11-30 NOTE — Discharge Instructions (Signed)
As discussed, it is important to take all medication as directed and be sure to follow-up with your dentist. Return here for concerning changes in your condition.

## 2016-11-30 NOTE — ED Triage Notes (Signed)
Pt presents via EMS with c/o migraine. She c/o L sided dental pain, L eye pain, and migraine that moves back and forth to both sides of her head. She has tried aspirin and tylenol PM with no relief. She has been under increased stress lately due to a death in the family

## 2016-11-30 NOTE — ED Provider Notes (Signed)
[facility] Provider Note   CSN: [id number] Arrival date & time: 11/30/16  1648  By signing my name below, I, [name] and [name], attest that this documentation has been prepared under the direction and in the presence of Gerhard Munch, MD. Electronically Signed: Deland Pretty and [name], ED Scribe. 11/30/16. 6:12 PM.   History   Chief Complaint Chief Complaint  Patient presents with  . Migraine   The history is provided by the patient. No language interpreter was used.   HPI Comments: Jessica Walton is a 31 y.o. female who presents to the Emergency Department complaining of left upper dental pain that began a few days ago with associated HA to left face with radiation to the right and posteriorly. She took aspirin and Tylenol pm that gave her inadequate relief. She has one prior visit (08/25/2016) to the ED for tx of a tension headache. Additionally, pt notes dizziness and transient right-sided weakness that occurred this morning and has since resolved. Pt also complains of elevated BP in the last few days since the recent murder of his sibling, but denies being prescribed blood pressure medications. She notes fever yesterday, but denies any since. The pt denies nausea, vomiting, and diarrhea.  Past Medical History:  Diagnosis Date  . Asthma    No inhaler use x 1 yr (2013)  . Headache   . Hypertension   . Lordosis   . Seizures [facility])     Patient Active Problem List   Diagnosis Date Noted  . Seizure, convulsion (HCC) 04/30/2015  . Pregnancy 09/24/2013  . Preterm premature rupture of membranes in third trimester 09/14/2013  . Supervision of high-risk pregnancy 07/03/2013  . History of prior pregnancy with short cervix, currently pregnant in second trimester 07/03/2013    Past Surgical History:  Procedure Laterality Date  . EYE SURGERY    . WISDOM TOOTH EXTRACTION     x4    OB History    Gravida Para Term Preterm AB Living   4 2 1 1 2 1    SAB TAB Ectopic Multiple Live Births   1 1 0 0 1       Home Medications    Prior to Admission medications   Medication Sig Start Date End Date Taking? Authorizing Provider  albuterol (PROVENTIL HFA;VENTOLIN HFA) 108 (90 BASE) MCG/ACT inhaler Inhale 2 puffs into the lungs every 6 (six) hours as needed for wheezing or shortness of breath.    [provider]  ALPRAZolam Prudy Feeler) 1 MG tablet Take 1 mg by mouth 3 (three) times daily as needed for anxiety.  02/24/15   [provider]  Aspirin-Acetaminophen-Caffeine (GOODY HEADACHE PO) Take 1 each by mouth 2 (two) times daily as needed (pain).    [provider]  butalbital-acetaminophen-caffeine (FIORICET, ESGIC) 50-325-40 MG tablet Take 1-2 tablets by mouth every 8 (eight) hours as needed for headache. 08/25/16 08/25/17  Antony Madura, PA-C  cetirizine (ZYRTEC ALLERGY) 10 MG tablet Take 1 tablet (10 mg total) by mouth daily as needed (nasal congestion). 08/25/16   Antony Madura, PA-C    Family History Family History  Problem Relation Age of Onset  . Hypertension Maternal Grandmother   . Diabetes Maternal Grandmother   . Seizures Neg Hx     Social History Social History  Substance Use Topics  . Smoking status: Current Some Day Smoker    Packs/day: 0.50    Years: 10.00    Types: Cigarettes  . Smokeless tobacco: Never Used  .  Alcohol use 0.0 oz/week     Allergies   Benadryl [diphenhydramine hcl]   Review of Systems Review of Systems  Constitutional: Negative for fever.  HENT: Positive for dental problem.   Eyes: Negative for visual disturbance.  Respiratory: Negative for shortness of breath.   Cardiovascular: Negative for chest pain.  Gastrointestinal: Negative for diarrhea, nausea and vomiting.  Musculoskeletal: Negative for neck pain.  Skin: Negative for wound.  Allergic/Immunologic: Negative for immunocompromised state.  Neurological: Positive for headaches.  Psychiatric/Behavioral: The patient is  nervous/anxious.      Physical Exam Updated Vital Signs BP (!) 133/105 (BP Location: Right Arm)   Pulse 97   Temp 98 F (36.7 C) (Oral)   Resp 16   SpO2 100%   Physical Exam  Constitutional: She is oriented to person, place, and time. She appears well-developed and well-nourished. No distress.  HENT:  Head: Normocephalic and atraumatic.  Mouth/Throat:    Eyes: Conjunctivae and EOM are normal.  Cardiovascular: Normal rate and regular rhythm.   Pulmonary/Chest: Effort normal and breath sounds normal. No stridor. No respiratory distress.  Abdominal: She exhibits no distension.  Musculoskeletal: She exhibits no edema.  Neurological: She is alert and oriented to person, place, and time. No cranial nerve deficit.  Skin: Skin is warm and dry.  Psychiatric: Her mood appears anxious.  Nursing note and vitals reviewed.    ED Treatments / Results   DIAGNOSTIC STUDIES: Oxygen Saturation is 100% on RA, normal by my interpretation.   COORDINATION OF CARE: 5:53 PM-Discussed next steps with pt. Pt verbalized understanding and is agreeable with the plan.   Patient declined IV medication, fluids.  Procedures Procedures (including critical care time)  Medications Ordered in ED Medications - No data to display   Initial Impression / Assessment and Plan / ED Course  I have reviewed the triage vital signs and the nursing notes.  Pertinent labs & imaging results that were available during my care of the patient were reviewed by me and considered in my medical decision making (see chart for details).  8:25 PM On repeat exam the patient appears well, states that she feels somewhat better. She remains afebrile. I discussed all findings with the patient and her family members. With suspicion for atypical migraine, likely completed by dental infection, the patient will start antibiotics, additional analgesia, and follow-up with dental. No evidence for bacteremia, sepsis, central  neurologic process  Final Clinical Impressions(s) / ED Diagnoses   Final diagnoses:  Bad headache  Dental infection    New Prescriptions New Prescriptions   AMOXICILLIN-CLAVULANATE (AUGMENTIN) 875-125 MG TABLET    Take 1 tablet by mouth every 12 (twelve) hours.   HYDROCODONE-ACETAMINOPHEN (NORCO/VICODIN) 5-325 MG TABLET    Take 1 tablet by mouth every 6 (six) hours as needed for severe pain.    I personally performed the services described in this documentation, which was scribed in my presence. The recorded information has been reviewed and is accurate.         Gerhard MunchLockwood, Elford Evilsizer, MD 11/30/16 2025

## 2016-11-30 NOTE — ED Notes (Signed)
Pt. Refused the IV and  IV medication. Requesdted something orally. Dr. Jeraldine LootsLockwood notfied.i

## 2016-11-30 NOTE — ED Notes (Signed)
Pt and family member requested and given a sandwich and drink.

## 2016-12-07 ENCOUNTER — Encounter (HOSPITAL_COMMUNITY): Payer: Self-pay | Admitting: Emergency Medicine

## 2016-12-07 ENCOUNTER — Emergency Department (HOSPITAL_COMMUNITY)
Admission: EM | Admit: 2016-12-07 | Discharge: 2016-12-07 | Disposition: A | Discharge status: Home / Self Care | Payer: Medicaid Other | Attending: Emergency Medicine | Admitting: Emergency Medicine

## 2016-12-07 DIAGNOSIS — I10 Essential (primary) hypertension: Secondary | ICD-10-CM | POA: Insufficient documentation

## 2016-12-07 DIAGNOSIS — K0889 Other specified disorders of teeth and supporting structures: Secondary | ICD-10-CM | POA: Diagnosis not present

## 2016-12-07 DIAGNOSIS — J45909 Unspecified asthma, uncomplicated: Secondary | ICD-10-CM | POA: Insufficient documentation

## 2016-12-07 DIAGNOSIS — F17210 Nicotine dependence, cigarettes, uncomplicated: Secondary | ICD-10-CM | POA: Diagnosis not present

## 2016-12-07 DIAGNOSIS — R6884 Jaw pain: Secondary | ICD-10-CM | POA: Diagnosis present

## 2016-12-07 DIAGNOSIS — Z79899 Other long term (current) drug therapy: Secondary | ICD-10-CM | POA: Insufficient documentation

## 2016-12-07 MED ORDER — OXYCODONE HCL 5 MG PO TABS: 5.0000 mg | ORAL_TABLET | Freq: Four times a day (QID) | ORAL | 0 refills | Status: DC | PRN

## 2016-12-07 MED ORDER — OXYCODONE HCL 5 MG PO TABS
5.0000 mg | ORAL_TABLET | Freq: Once | ORAL | Status: AC
  Administered 2016-12-07: 5 mg via ORAL
  Filled 2016-12-07: qty 1

## 2016-12-07 MED ORDER — BENZOCAINE 20 % MT GEL: 1.0000 "application " | Freq: Three times a day (TID) | OROMUCOSAL | 0 refills | Status: DC | PRN
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
## 2016-12-07 NOTE — ED Notes (Signed)
[facility]
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
## 2016-12-07 NOTE — Discharge Instructions (Signed)
Your dental pain does not appear to be from an infection.    You need to be evaluated by a dentist who may or may do x-rays to determine the cause of your pain.   Please contact one of the dentists listed above or any dentist from the dental resource guide to make an appointment.  Take oxycodone for severe pain.  Use benzocaine gel topically as needed for additional temporary pain relief.

## 2017-01-28 ENCOUNTER — Encounter (HOSPITAL_COMMUNITY): Payer: Self-pay | Admitting: Emergency Medicine

## 2017-01-28 ENCOUNTER — Emergency Department (HOSPITAL_COMMUNITY)
Admission: EM | Admit: 2017-01-28 | Discharge: 2017-01-28 | Disposition: A | Discharge status: Home / Self Care | Payer: Medicaid Other | Attending: Emergency Medicine | Admitting: Emergency Medicine

## 2017-01-28 DIAGNOSIS — J020 Streptococcal pharyngitis: Secondary | ICD-10-CM | POA: Insufficient documentation

## 2017-01-28 DIAGNOSIS — J45909 Unspecified asthma, uncomplicated: Secondary | ICD-10-CM | POA: Diagnosis not present

## 2017-01-28 DIAGNOSIS — J029 Acute pharyngitis, unspecified: Secondary | ICD-10-CM | POA: Diagnosis present

## 2017-01-28 DIAGNOSIS — I10 Essential (primary) hypertension: Secondary | ICD-10-CM | POA: Insufficient documentation

## 2017-01-28 DIAGNOSIS — F17210 Nicotine dependence, cigarettes, uncomplicated: Secondary | ICD-10-CM | POA: Insufficient documentation

## 2017-01-28 LAB — RAPID STREP SCREEN (MED CTR MEBANE ONLY): Streptococcus, Group A Screen (Direct): POSITIVE — AB

## 2017-01-28 MED ORDER — TRAMADOL HCL 50 MG PO TABS
50.0000 mg | ORAL_TABLET | Freq: Once | ORAL | Status: AC
  Administered 2017-01-28: 50 mg via ORAL
  Filled 2017-01-28: qty 1

## 2017-01-28 MED ORDER — PENICILLIN G BENZATHINE 1200000 UNIT/2ML IM SUSP
1.2000 10*6.[IU] | Freq: Once | INTRAMUSCULAR | Status: AC
  Administered 2017-01-28: 1.2 10*6.[IU] via INTRAMUSCULAR
  Filled 2017-01-28: qty 2

## 2017-01-28 MED ORDER — TRAMADOL HCL 50 MG PO TABS: 50.0000 mg | ORAL_TABLET | Freq: Four times a day (QID) | ORAL | 0 refills | Status: DC | PRN

## 2017-01-28 NOTE — ED Provider Notes (Signed)
[facility]-EMERGENCY DEPT Provider Note   CSN: [id number] Arrival date & time: 01/28/17  1812     History   Chief Complaint Chief Complaint  Patient presents with  . Sore Throat  . Emesis    HPI Jessica Walton is a 31 y.o. female who presents to the ED with sore throat and fever. The sore throat started 2 days ago. She reports that 3 days prior to the sore throat she had n/v but that resolved. She has taken nothing for her symptoms.   The history is provided by the patient. No language interpreter was used.  Sore Throat  This is a new problem. The current episode started 2 days ago. The problem occurs constantly. The problem has been gradually worsening. Associated symptoms include headaches. Pertinent negatives include no chest pain and no abdominal pain. The symptoms are aggravated by eating and swallowing. Nothing relieves the symptoms.  Emesis   Associated symptoms include chills, a fever, headaches and myalgias. Pertinent negatives include no abdominal pain and no cough.    Past Medical History:  Diagnosis Date  . Asthma    No inhaler use x 1 yr (2013)  . Headache   . Hypertension   . Lordosis   . Seizures [facility](HCC)     Patient Active Problem List   Diagnosis Date Noted  . Seizure, convulsion (HCC) 04/30/2015  . Pregnancy 09/24/2013  . Preterm premature rupture of membranes in third trimester 09/14/2013  . Supervision of high-risk pregnancy 07/03/2013  . History of prior pregnancy with short cervix, currently pregnant in second trimester 07/03/2013    Past Surgical History:  Procedure Laterality Date  . EYE SURGERY    . WISDOM TOOTH EXTRACTION     x4    OB History    Gravida Para Term Preterm AB Living   4 2 1 1 2 1    SAB TAB Ectopic Multiple Live Births   1 1 0 0 1       Home Medications    Prior to Admission medications   Medication Sig Start Date End Date Taking? Authorizing Provider  albuterol (PROVENTIL HFA;VENTOLIN HFA) 108 (90 BASE) MCG/ACT inhaler  Inhale 2 puffs into the lungs every 6 (six) hours as needed for wheezing or shortness of breath.    [provider]  ALPRAZolam Prudy Feeler(XANAX) 1 MG tablet Take 1 mg by mouth 3 (three) times daily as needed for anxiety.  02/24/15   [provider]  amoxicillin-clavulanate (AUGMENTIN) 875-125 MG tablet Take 1 tablet by mouth every 12 (twelve) hours. 11/30/16   Gerhard MunchLockwood, Robert, MD  benzocaine (HURRICAINE) 20 % GEL Use as directed 1 application in the mouth or throat 3 (three) times daily as needed. 12/07/16   Liberty HandyGibbons, Claudia J, PA-C  cetirizine (ZYRTEC ALLERGY) 10 MG tablet Take 1 tablet (10 mg total) by mouth daily as needed (nasal congestion). 08/25/16   Antony MaduraHumes, Kelly, PA-C  HYDROcodone-acetaminophen (NORCO/VICODIN) 5-325 MG tablet Take 1 tablet by mouth every 6 (six) hours as needed for severe pain. 11/30/16   Gerhard MunchLockwood, Robert, MD  oxyCODONE (ROXICODONE) 5 MG immediate release tablet Take 1 tablet (5 mg total) by mouth every 6 (six) hours as needed for severe pain. 12/07/16   Liberty HandyGibbons, Claudia J, PA-C  traMADol (ULTRAM) 50 MG tablet Take 1 tablet (50 mg total) by mouth every 6 (six) hours as needed. 01/28/17   Janne NapoleonNeese, Amarra Sawyer M, NP    Family History Family History  Problem Relation Age of Onset  . Hypertension Maternal Grandmother   .  Diabetes Maternal Grandmother   . Seizures Neg Hx     Social History Social History  Substance Use Topics  . Smoking status: Current Some Day Smoker    Packs/day: 0.50    Years: 10.00    Types: Cigarettes  . Smokeless tobacco: Never Used  . Alcohol use 0.0 oz/week     Allergies   Benadryl [diphenhydramine hcl] and Vicodin [hydrocodone-acetaminophen]   Review of Systems Review of Systems  Constitutional: Positive for chills and fever.  HENT: Positive for sore throat. Negative for dental problem, ear pain, facial swelling and trouble swallowing.   Eyes: Negative for discharge and redness.  Respiratory: Negative for cough.   Cardiovascular: Negative for  chest pain.  Gastrointestinal: Positive for vomiting (that resolved 3 days ago). Negative for abdominal pain.  Genitourinary: Negative for difficulty urinating.  Musculoskeletal: Positive for myalgias.  Skin: Negative for rash.  Neurological: Positive for headaches.  Hematological: Positive for adenopathy.  Psychiatric/Behavioral: The patient is not nervous/anxious.      Physical Exam Updated Vital Signs BP 136/83 (BP Location: Left Arm)   Pulse 91   Temp 97.8 F (36.6 C) (Oral)   Resp 16   Ht 5\' 9"  (1.753 m)   Wt 74.8 kg (165 lb)   LMP 01/22/2017   SpO2 100%   BMI 24.37 kg/m   Physical Exam  Constitutional: She is oriented to person, place, and time. She appears well-developed and well-nourished. No distress.  HENT:  Head: Normocephalic and atraumatic.  Right Ear: Tympanic membrane normal.  Left Ear: Tympanic membrane normal.  Nose: Nose normal.  Mouth/Throat: Uvula is midline and mucous membranes are normal. Oropharyngeal exudate and posterior oropharyngeal erythema present.  Eyes: EOM are normal.  Neck: Neck supple.  Cardiovascular: Normal rate and regular rhythm.   Pulmonary/Chest: Effort normal and breath sounds normal.  Abdominal: Soft. There is no tenderness.  Musculoskeletal: Normal range of motion.  Lymphadenopathy:    She has cervical adenopathy.  Neurological: She is alert and oriented to person, place, and time. No cranial nerve deficit.  Skin: Skin is warm and dry.  Psychiatric: She has a normal mood and affect. Her behavior is normal.  Nursing note and vitals reviewed.    ED Treatments / Results  Labs (all labs ordered are listed, but only abnormal results are displayed) Labs Reviewed  RAPID STREP SCREEN (NOT AT [facility]) - Abnormal; Notable for the following:       Result Value   Streptococcus, Group A Screen (Direct) POSITIVE (*)    All other components within normal limits    Radiology No results found.  Procedures Procedures (including  critical care time)  Medications Ordered in ED Medications  penicillin g benzathine (BICILLIN LA) 1200000 UNIT/2ML injection 1.2 Million Units (1.2 Million Units Intramuscular Given 01/28/17 2115)  traMADol (ULTRAM) tablet 50 mg (50 mg Oral Given 01/28/17 2115)     Initial Impression / Assessment and Plan / ED Course  I have reviewed the triage vital signs and the nursing notes.  Pertinent lab results that were available during my care of the patient were reviewed by me and considered in my medical decision making (see chart for details).  Final Clinical Impressions(s) / ED Diagnoses  31 y.o. female with sore throat and fever stable for d/c without difficulty swallowing and no concern for Ludwig's angina at this time, no tonsillar abscess. Treated for positive strep screen with penicillin injection. Return precautions discussed.   Final diagnoses:  Strep pharyngitis    New  Prescriptions Discharge Medication List as of 01/28/2017  9:30 PM    START taking these medications   Details  traMADol (ULTRAM) 50 MG tablet Take 1 tablet (50 mg total) by mouth every 6 (six) hours as needed., Starting Sat 01/28/2017, Print         [place], [place], [place] 01/29/17 0152    Laurence Spates, MD 01/30/17 1558

## 2017-01-28 NOTE — Discharge Instructions (Signed)
Continue to take tylenol and ibuprofen. Do not take the narcotic pain medication if you are driving as it will make you sleepy. You will not need additional antibiotics since you had the penicillin injection.

## 2017-01-28 NOTE — ED Triage Notes (Signed)
Received Pt from home via EMS, Pt c/o n/V x 3 days that ended yesterday along with sore throat and sinus pressure. Pt has history of HTN but does not take any medicine for it.

## 2017-02-04 ENCOUNTER — Emergency Department (HOSPITAL_COMMUNITY): Payer: Medicaid Other

## 2017-02-04 ENCOUNTER — Encounter (HOSPITAL_COMMUNITY): Payer: Self-pay

## 2017-02-04 ENCOUNTER — Emergency Department (HOSPITAL_COMMUNITY)
Admission: EM | Admit: 2017-02-04 | Discharge: 2017-02-04 | Disposition: A | Discharge status: Home / Self Care | Payer: Medicaid Other | Attending: Emergency Medicine | Admitting: Emergency Medicine

## 2017-02-04 DIAGNOSIS — S92335A Nondisplaced fracture of third metatarsal bone, left foot, initial encounter for closed fracture: Secondary | ICD-10-CM | POA: Insufficient documentation

## 2017-02-04 DIAGNOSIS — Y929 Unspecified place or not applicable: Secondary | ICD-10-CM | POA: Diagnosis not present

## 2017-02-04 DIAGNOSIS — F17210 Nicotine dependence, cigarettes, uncomplicated: Secondary | ICD-10-CM | POA: Insufficient documentation

## 2017-02-04 DIAGNOSIS — I10 Essential (primary) hypertension: Secondary | ICD-10-CM | POA: Diagnosis not present

## 2017-02-04 DIAGNOSIS — W1830XA Fall on same level, unspecified, initial encounter: Secondary | ICD-10-CM | POA: Diagnosis not present

## 2017-02-04 DIAGNOSIS — Y999 Unspecified external cause status: Secondary | ICD-10-CM | POA: Diagnosis not present

## 2017-02-04 DIAGNOSIS — S92345A Nondisplaced fracture of fourth metatarsal bone, left foot, initial encounter for closed fracture: Secondary | ICD-10-CM | POA: Diagnosis not present

## 2017-02-04 DIAGNOSIS — J45909 Unspecified asthma, uncomplicated: Secondary | ICD-10-CM | POA: Insufficient documentation

## 2017-02-04 DIAGNOSIS — S92312A Displaced fracture of first metatarsal bone, left foot, initial encounter for closed fracture: Secondary | ICD-10-CM | POA: Insufficient documentation

## 2017-02-04 DIAGNOSIS — Y939 Activity, unspecified: Secondary | ICD-10-CM | POA: Insufficient documentation

## 2017-02-04 DIAGNOSIS — S99922A Unspecified injury of left foot, initial encounter: Secondary | ICD-10-CM

## 2017-02-04 DIAGNOSIS — Z8781 Personal history of (healed) traumatic fracture: Secondary | ICD-10-CM | POA: Insufficient documentation

## 2017-02-04 DIAGNOSIS — S92212A Displaced fracture of cuboid bone of left foot, initial encounter for closed fracture: Secondary | ICD-10-CM | POA: Insufficient documentation

## 2017-02-04 DIAGNOSIS — S92325A Nondisplaced fracture of second metatarsal bone, left foot, initial encounter for closed fracture: Secondary | ICD-10-CM | POA: Diagnosis not present

## 2017-02-04 DIAGNOSIS — S92902A Unspecified fracture of left foot, initial encounter for closed fracture: Secondary | ICD-10-CM

## 2017-02-04 MED ORDER — OXYCODONE HCL 5 MG PO TABS: 5.0000 mg | ORAL_TABLET | ORAL | 0 refills | Status: DC | PRN

## 2017-02-04 NOTE — ED Provider Notes (Signed)
[facility]-EMERGENCY DEPT Provider Note   CSN: [id number] Arrival date & time: 02/04/17  1934     History   Chief Complaint Chief Complaint  Patient presents with  . Foot Injury    HPI Jessica Walton is a 31 y.o. female.  HPI  Patient presents to ED for left foot pain that began last night. She states that she was out drinking and is unsure if she felt that her friend state that she did. She has had pain in the left foot and has been unable to  Bear weight since the incident. She has history of prior fractures in this same foot several years ago. She denies any previous procedure done in the area. She denies any numbness, weakness, headache or loss of consciousness.  Past Medical History:  Diagnosis Date  . Asthma    No inhaler use x 1 yr (2013)  . Headache   . Hypertension   . Lordosis   . Seizures [facility](HCC)     Patient Active Problem List   Diagnosis Date Noted  . Seizure, convulsion (HCC) 04/30/2015  . Pregnancy 09/24/2013  . Preterm premature rupture of membranes in third trimester 09/14/2013  . Supervision of high-risk pregnancy 07/03/2013  . History of prior pregnancy with short cervix, currently pregnant in second trimester 07/03/2013    Past Surgical History:  Procedure Laterality Date  . EYE SURGERY    . WISDOM TOOTH EXTRACTION     x4    OB History    Gravida Para Term Preterm AB Living   4 2 1 1 2 1    SAB TAB Ectopic Multiple Live Births   1 1 0 0 1       Home Medications    Prior to Admission medications   Medication Sig Start Date End Date Taking? Authorizing Provider  albuterol (PROVENTIL HFA;VENTOLIN HFA) 108 (90 BASE) MCG/ACT inhaler Inhale 2 puffs into the lungs every 6 (six) hours as needed for wheezing or shortness of breath.    [provider]  ALPRAZolam Prudy Feeler(XANAX) 1 MG tablet Take 1 mg by mouth 3 (three) times daily as needed for anxiety.  02/24/15   [provider]  amoxicillin-clavulanate (AUGMENTIN) 875-125 MG tablet Take 1  tablet by mouth every 12 (twelve) hours. 11/30/16   Gerhard MunchLockwood, Robert, MD  benzocaine (HURRICAINE) 20 % GEL Use as directed 1 application in the mouth or throat 3 (three) times daily as needed. 12/07/16   Liberty HandyGibbons, Claudia J, PA-C  cetirizine (ZYRTEC ALLERGY) 10 MG tablet Take 1 tablet (10 mg total) by mouth daily as needed (nasal congestion). 08/25/16   Antony MaduraHumes, Kelly, PA-C  HYDROcodone-acetaminophen (NORCO/VICODIN) 5-325 MG tablet Take 1 tablet by mouth every 6 (six) hours as needed for severe pain. 11/30/16   Gerhard MunchLockwood, Robert, MD  oxyCODONE (ROXICODONE) 5 MG immediate release tablet Take 1 tablet (5 mg total) by mouth every 4 (four) hours as needed for severe pain. 02/04/17   Catheryn Slifer, PA-C  traMADol (ULTRAM) 50 MG tablet Take 1 tablet (50 mg total) by mouth every 6 (six) hours as needed. 01/28/17   Janne NapoleonNeese, Hope M, NP    Family History Family History  Problem Relation Age of Onset  . Hypertension Maternal Grandmother   . Diabetes Maternal Grandmother   . Seizures Neg Hx     Social History Social History  Substance Use Topics  . Smoking status: Current Some Day Smoker    Packs/day: 0.50    Years: 10.00    Types: Cigarettes  .  Smokeless tobacco: Never Used  . Alcohol use 0.0 oz/week     Allergies   Benadryl [diphenhydramine hcl] and Vicodin [hydrocodone-acetaminophen]   Review of Systems Review of Systems  Eyes: Negative for photophobia and visual disturbance.  Gastrointestinal: Negative for nausea and vomiting.  Musculoskeletal: Positive for myalgias.  Skin: Negative for color change, rash and wound.  Neurological: Negative for weakness, numbness and headaches.     Physical Exam Updated Vital Signs BP (!) 174/112 (BP Location: Left Arm)   Pulse (!) 106   Temp 98.3 F (36.8 C) (Oral)   Resp 18   Ht 5\' 9"  (1.753 m)   Wt 74.8 kg (165 lb)   LMP 02/01/2017   SpO2 100%   BMI 24.37 kg/m   Physical Exam  Constitutional: She appears well-developed and well-nourished. No  distress.  HENT:  Head: Normocephalic and atraumatic.  Eyes: Conjunctivae and EOM are normal. No scleral icterus.  Neck: Normal range of motion.  Pulmonary/Chest: Effort normal. No respiratory distress.  Musculoskeletal: She exhibits edema and tenderness.       Feet:  Tenderness to palpation at the indicated area. There is significant edema present. Sensation intact to light touch. Strength 5/5 in bilateral lower extremities. Pain with movement of digits.  Neurological: She is alert.  Skin: No rash noted. She is not diaphoretic.  Psychiatric: She has a normal mood and affect.  Nursing note and vitals reviewed.    ED Treatments / Results  Labs (all labs ordered are listed, but only abnormal results are displayed) Labs Reviewed - No data to display  EKG  EKG Interpretation None       Radiology Dg Ankle Complete Left  Result Date: 02/04/2017 CLINICAL DATA:  Larey Seat down a hill at a party last night, noticed pain and swelling in LEFT foot and ankle when she awoke today EXAM: LEFT ANKLE COMPLETE - 3+ VIEW COMPARISON:  LEFT foot radiographs 02/04/2017 FINDINGS: Osseous mineralization normal. Ankle joint space preserved. Metatarsal fractures identified on foot radiographs are less well visualized on the ankle exam. Mildly displaced fracture fragment at the lateral distal margin of the cuboid again noted. No additional ankle joint fracture or dislocation seen. IMPRESSION: No additional LEFT ankle abnormalities. Please see LEFT foot radiograph report for discussion of multiple fractures visualized by that exam. Electronically Signed   By: Ulyses Southward M.D.   On: 02/04/2017 20:10   Dg Foot Complete Left  Result Date: 02/04/2017 CLINICAL DATA:  Larey Seat down a hill at a party last night, noticed pain and swelling in LEFT foot and ankle when a awoke today EXAM: LEFT FOOT - COMPLETE 3+ VIEW COMPARISON:  03/28/2010 FINDINGS: Osseous mineralization normal. Joint spaces preserved. Nondisplaced transverse  fractures at the bases of the second, third, and fourth metatarsals. Fifth metatarsal base appears intact. Small minimally displaced fracture fragment at the distal lateral margin of the cuboid. Additionally, minimally displaced fracture at medial margin base of first metatarsal. Scattered soft tissue swelling LEFT foot. No additional fracture, dislocation or bone destruction. IMPRESSION: Fractures at the bases of the LEFT first, second, third, and fourth metatarsals as well as the distal lateral margin of the cuboid as above. Electronically Signed   By: Ulyses Southward M.D.   On: 02/04/2017 20:08    Procedures Procedures (including critical care time)  Medications Ordered in ED Medications - No data to display   Initial Impression / Assessment and Plan / ED Course  I have reviewed the triage vital signs and the nursing notes.  Pertinent labs & imaging results that were available during my care of the patient were reviewed by me and considered in my medical decision making (see chart for details).     Patient presents to the ED for left foot pain after injury that occurred last night. She reports is unsure of the exact mechanism of injury but friend states that she fell last night. She denies any head injury or loss of consciousness. She reports pain with weightbearing as well as increased swelling noted. She reports history of previous fracture in same foot several years ago. She denies any previous procedure done in the area. On physical exam she is tender to palpation below the toes and there is edema present. Sensation intact to light touch. 2+ DP pulses bilaterally. X-ray showed fractures at the bases of the left first, second, third and fourth metatarsals as well as the distal lateral margin of the cuboid. This is a closed fracture. Will place patient in a cam walker, provided crutches and pain medication and advised her to follow up with orthopedics in 4-5 days for further evaluation and  weightbearing. Patient's blood pressure elevated here in the ED initially and at recheck. Patient states that she has history of hypertension but does not take anything for it. I advised patient to follow up with her PCP for further evaluation of her high blood pressure and to start any medications for it. Patient appears stable for discharge at this time. Strict return precautions given.  Final Clinical Impressions(s) / ED Diagnoses   Final diagnoses:  Closed fracture of left foot, initial encounter  Injury of left foot, initial encounter    New Prescriptions Discharge Medication List as of 02/04/2017  8:53 PM       Dietrich Pates, PA-C 02/04/17 2113    Lorre Nick, MD 02/04/17 2316

## 2017-02-04 NOTE — Discharge Instructions (Signed)
Please read attached information regarding your condition. Follow up with orthopedist listed below in 4-5 days. Use crutches as directed. Return to ED for worsening pain, numbness, weakness, additional injury, trouble walking.

## 2017-02-04 NOTE — ED Notes (Signed)
Provider made aware of high BP and elevated HR at discharge. Pt advised to follow up with her doctor about her BP, hx of same.

## 2017-02-04 NOTE — ED Triage Notes (Signed)
Pt presents with c/o left foot pain. Pt reports she was out drinking last night, unsure of what happened but reports that she was told by a friend that she might have fallen. Pt reports she is having pain in the foot and ankle and is unable to put any pressure on that foot.

## 2017-02-13 ENCOUNTER — Ambulatory Visit (INDEPENDENT_AMBULATORY_CARE_PROVIDER_SITE_OTHER): Payer: Medicaid Other | Admitting: Family

## 2017-02-15 ENCOUNTER — Encounter (INDEPENDENT_AMBULATORY_CARE_PROVIDER_SITE_OTHER): Payer: Self-pay | Admitting: Family

## 2017-02-15 ENCOUNTER — Ambulatory Visit (INDEPENDENT_AMBULATORY_CARE_PROVIDER_SITE_OTHER): Payer: Medicaid Other | Admitting: Family

## 2017-02-15 VITALS — Ht 69.0 in | Wt 165.0 lb

## 2017-02-15 DIAGNOSIS — S92302A Fracture of unspecified metatarsal bone(s), left foot, initial encounter for closed fracture: Secondary | ICD-10-CM

## 2017-02-15 MED ORDER — OXYCODONE-ACETAMINOPHEN 5-325 MG PO TABS: 1.0000 | ORAL_TABLET | Freq: Four times a day (QID) | ORAL | 0 refills | Status: DC | PRN

## 2017-02-15 NOTE — Progress Notes (Signed)
Office Visit Note   Patient: Jessica Walton           Date of Birth: 11/01/1985           MRN: [id number] Visit Date: 02/15/2017              Requested by: Billee CashingMcKenzie, [place], MD [address] [place] [id number] PCP: Billee CashingMcKenzie, [place], MD  Chief Complaint  Patient presents with  . Left Foot - Follow-up     Er visit s/p fall fractures bases of 1st, 2nd, 3rd and 4th MT and distal lateral margin cuboid.       HPI: The patient is a 31 year old woman who presents today in follow-up for multiple metatarsal fractures as well as the cuboid on the left foot. She was at a party on July 14 and fell down a hill. She's been in a fracture boot carrying her crutches. Complains of pain. States she has no pain medication.  Does note that she has had a similar fracture to the same foot in the past several years ago.  Assessment & Plan: Visit Diagnoses:  1. Multiple closed fractures of metatarsal bone of left foot, initial encounter     Plan: Encouraged her to use her crutches she'll continue in the fracture boot. Advised to use I provided naproxen for anti-inflammatory. We will give her 1 time prescription for Percocet. She'll follow-up in office in 2 weeks for repeat radiographs.  Follow-Up Instructions: Return in about 2 weeks (around 03/01/2017).   Ortho Exam  Patient is alert, oriented, no adenopathy, well-dressed, normal affect, normal respiratory effort. There is mild swelling to the left foot. There is tenderness over the dorsum of the foot. No erythema no open areas.  Imaging: No results found.  Labs: Lab Results  Component Value Date   REPTSTATUS 01/14/2013 FINAL 01/11/2013   CULT ENTEROBACTER AEROGENES 01/11/2013   LABORGA ENTEROBACTER AEROGENES 07/03/2013   LABORGA ENTEROCOCCUS SPECIES 07/03/2013    Orders:  No orders of the defined types were placed in this encounter.  Meds ordered this encounter  Medications  . oxyCODONE-acetaminophen (PERCOCET/ROXICET)  5-325 MG tablet    Sig: Take 1 tablet by mouth every 6 (six) hours as needed for severe pain.    Dispense:  30 tablet    Refill:  0     Procedures: No procedures performed  Clinical Data: No additional findings.  ROS:  All other systems negative, except as noted in the HPI. Review of Systems  Constitutional: Negative for chills and fever.  Musculoskeletal: Positive for arthralgias and joint swelling.  Skin: Positive for color change.    Objective: Vital Signs: Ht 5\' 9"  (1.753 m)   Wt 165 lb (74.8 kg)   LMP 02/01/2017   BMI 24.37 kg/m   Specialty Comments:  No specialty comments available.  PMFS History: Patient Active Problem List   Diagnosis Date Noted  . Seizure, convulsion (HCC) 04/30/2015  . Pregnancy 09/24/2013  . Preterm premature rupture of membranes in third trimester 09/14/2013  . Supervision of high-risk pregnancy 07/03/2013  . History of prior pregnancy with short cervix, currently pregnant in second trimester 07/03/2013   Past Medical History:  Diagnosis Date  . Asthma    No inhaler use x 1 yr (2013)  . Headache   . Hypertension   . Lordosis   . Seizures (HCC)     Family History  Problem Relation Age of Onset  . Hypertension Maternal Grandmother   . Diabetes Maternal Grandmother   .  Seizures Neg Hx     Past Surgical History:  Procedure Laterality Date  . EYE SURGERY    . WISDOM TOOTH EXTRACTION     x4   Social History   Occupational History  . Not on file.   Social History Main Topics  . Smoking status: Current Some Day Smoker    Packs/day: 0.50    Years: 10.00    Types: Cigarettes  . Smokeless tobacco: Never Used  . Alcohol use 0.0 oz/week  . Drug use: No  . Sexual activity: Not on file

## 2017-02-17 ENCOUNTER — Telehealth (INDEPENDENT_AMBULATORY_CARE_PROVIDER_SITE_OTHER): Payer: Self-pay | Admitting: Radiology

## 2017-02-17 NOTE — Telephone Encounter (Signed)
-----   Message from Adonis HugueninErin R Zamora, NP sent at 02/15/2017 10:56 AM EDT ----- Will you see if you can do a prior auth for her percocet, has medicaid  I dont think she will need it I gave a 7 day supply

## 2017-02-17 NOTE — Telephone Encounter (Signed)
Prior authorization completed on nctracks. Percocet prescription approved. Confirmation [id number].

## 2017-03-01 ENCOUNTER — Ambulatory Visit (INDEPENDENT_AMBULATORY_CARE_PROVIDER_SITE_OTHER): Payer: Medicaid Other | Admitting: Family

## 2017-07-11 ENCOUNTER — Inpatient Hospital Stay (HOSPITAL_COMMUNITY)
Admission: AD | Admit: 2017-07-11 | Discharge: 2017-07-11 | Disposition: A | Discharge status: Home / Self Care | Payer: Medicaid Other | Source: Ambulatory Visit | Attending: Obstetrics and Gynecology | Admitting: Obstetrics and Gynecology

## 2017-07-11 ENCOUNTER — Other Ambulatory Visit: Payer: Self-pay

## 2017-07-11 ENCOUNTER — Encounter (HOSPITAL_COMMUNITY): Payer: Self-pay | Admitting: *Deleted

## 2017-07-11 ENCOUNTER — Inpatient Hospital Stay (HOSPITAL_COMMUNITY): Payer: Medicaid Other

## 2017-07-11 DIAGNOSIS — Z79899 Other long term (current) drug therapy: Secondary | ICD-10-CM | POA: Insufficient documentation

## 2017-07-11 DIAGNOSIS — O161 Unspecified maternal hypertension, first trimester: Secondary | ICD-10-CM | POA: Insufficient documentation

## 2017-07-11 DIAGNOSIS — Z885 Allergy status to narcotic agent status: Secondary | ICD-10-CM | POA: Diagnosis not present

## 2017-07-11 DIAGNOSIS — O99511 Diseases of the respiratory system complicating pregnancy, first trimester: Secondary | ICD-10-CM | POA: Diagnosis not present

## 2017-07-11 DIAGNOSIS — Z202 Contact with and (suspected) exposure to infections with a predominantly sexual mode of transmission: Secondary | ICD-10-CM | POA: Insufficient documentation

## 2017-07-11 DIAGNOSIS — F17210 Nicotine dependence, cigarettes, uncomplicated: Secondary | ICD-10-CM | POA: Diagnosis not present

## 2017-07-11 DIAGNOSIS — O209 Hemorrhage in early pregnancy, unspecified: Secondary | ICD-10-CM | POA: Diagnosis not present

## 2017-07-11 DIAGNOSIS — O99331 Smoking (tobacco) complicating pregnancy, first trimester: Secondary | ICD-10-CM | POA: Diagnosis not present

## 2017-07-11 DIAGNOSIS — Z3A01 Less than 8 weeks gestation of pregnancy: Secondary | ICD-10-CM | POA: Diagnosis not present

## 2017-07-11 DIAGNOSIS — J45909 Unspecified asthma, uncomplicated: Secondary | ICD-10-CM | POA: Insufficient documentation

## 2017-07-11 DIAGNOSIS — Z3491 Encounter for supervision of normal pregnancy, unspecified, first trimester: Secondary | ICD-10-CM

## 2017-07-11 LAB — URINALYSIS, ROUTINE W REFLEX MICROSCOPIC
Bacteria, UA: NONE SEEN
Bilirubin Urine: NEGATIVE
Glucose, UA: NEGATIVE mg/dL
KETONES UR: NEGATIVE mg/dL
Nitrite: NEGATIVE
PH: 6 (ref 5.0–8.0)
PROTEIN: NEGATIVE mg/dL
Specific Gravity, Urine: 1.024 (ref 1.005–1.030)

## 2017-07-11 LAB — CBC
HCT: 40.8 % (ref 36.0–46.0)
Hemoglobin: 13.9 g/dL (ref 12.0–15.0)
MCH: 31.1 pg (ref 26.0–34.0)
MCHC: 34.1 g/dL (ref 30.0–36.0)
MCV: 91.3 fL (ref 78.0–100.0)
Platelets: 359 10*3/uL (ref 150–400)
RBC: 4.47 MIL/uL (ref 3.87–5.11)
RDW: 14.5 % (ref 11.5–15.5)
WBC: 8.8 10*3/uL (ref 4.0–10.5)

## 2017-07-11 LAB — ABO/RH: ABO/RH(D): A POS

## 2017-07-11 LAB — WET PREP, GENITAL
Clue Cells Wet Prep HPF POC: NONE SEEN
Sperm: NONE SEEN
Trich, Wet Prep: NONE SEEN
Yeast Wet Prep HPF POC: NONE SEEN

## 2017-07-11 LAB — HCG, QUANTITATIVE, PREGNANCY: hCG, Beta Chain, Quant, S: 16536 m[IU]/mL — ABNORMAL HIGH (ref <5)

## 2017-07-11 LAB — POCT PREGNANCY, URINE: Preg Test, Ur: POSITIVE — AB

## 2017-07-11 MED ORDER — AZITHROMYCIN 250 MG PO TABS
1000.0000 mg | ORAL_TABLET | Freq: Once | ORAL | Status: AC
  Administered 2017-07-11: 1000 mg via ORAL
  Filled 2017-07-11: qty 4

## 2017-07-11 MED ORDER — CEFTRIAXONE SODIUM 250 MG IJ SOLR
250.0000 mg | Freq: Once | INTRAMUSCULAR | Status: AC
  Administered 2017-07-11: 250 mg via INTRAMUSCULAR
  Filled 2017-07-11: qty 250

## 2017-07-11 NOTE — MAU Note (Signed)
Pt presents with c/o VB for 2 weeks "off and on".  Pt states bleeding heavy/light and passing clots @ times.  Pt also requests STI testing, states partner currently being treated for GC/chlamydia.  Reports last intercourse with partner was a couple weeks ago, "before he found out".

## 2017-07-11 NOTE — MAU Note (Signed)
Pt called back to room- pt not in lobby

## 2017-07-11 NOTE — MAU Provider Note (Signed)
Chief Complaint: Vaginal Bleeding and STI testing   First Provider Initiated Contact with Patient 07/11/17 1432     SUBJECTIVE HPI: Jessica Walton is a 31 y.o. [id number] at Unknown by LMP who presents to maternity admissions reporting vaginal bleeding and exposure to STD. She reports that she has been bleeding on and off since Saturday. It was spotting on Saturday then she started bleeding heavy with clots yesterday and today she is spotting again. She is not on any birth control. She report unprotected intercourse more than two weeks ago. Partner has recently been treated for GC/C- patient has not been tested or treated for STD. She declines other STD testing. She denies vaginal itching/burning, vaginal discharge, urinary symptoms, h/a, dizziness, n/v, or fever/chills.    Past Medical History:  Diagnosis Date  . Asthma    No inhaler use x 1 yr (2013)  . Headache   . Hypertension   . Lordosis   . Seizures (HCC)    Past Surgical History:  Procedure Laterality Date  . EYE SURGERY    . WISDOM TOOTH EXTRACTION     x4   Social History   Socioeconomic History  . Marital status: Single    Spouse name: Not on file  . Number of children: Not on file  . Years of education: 6414  . Highest education level: Not on file  Social Needs  . Financial resource strain: Not on file  . Food insecurity - worry: Not on file  . Food insecurity - inability: Not on file  . Transportation needs - medical: Not on file  . Transportation needs - non-medical: Not on file  Occupational History  . Not on file  Tobacco Use  . Smoking status: Current Some Day Smoker    Packs/day: 0.50    Years: 10.00    Pack years: 5.00    Types: Cigarettes  . Smokeless tobacco: Never Used  Substance and Sexual Activity  . Alcohol use: Yes    Alcohol/week: 0.0 oz    Comment: occasional  . Drug use: No  . Sexual activity: Yes  Other Topics Concern  . Not on file  Social History Narrative   Lives at home with family.    Caffeine use: none   No current facility-administered medications on file prior to encounter.    Current Outpatient Medications on File Prior to Encounter  Medication Sig Dispense Refill  . albuterol (PROVENTIL HFA;VENTOLIN HFA) 108 (90 Base) MCG/ACT inhaler Inhale 2 puffs into the lungs every 6 (six) hours as needed for wheezing or shortness of breath.    . ALPRAZolam (XANAX) 1 MG tablet Take 1 mg by mouth 3 (three) times daily as needed for anxiety.   3  . buPROPion (WELLBUTRIN XL) 150 MG 24 hr tablet Take 150 mg by mouth daily.    . citalopram (CELEXA) 20 MG tablet TAKE 1 TABLET BY MOUTH EVERY MORNING AS DIRECTED  0  . divalproex (DEPAKOTE) 250 MG DR tablet Take 250 mg by mouth 3 (three) times daily.    Marland Kitchen. albuterol (PROVENTIL HFA;VENTOLIN HFA) 108 (90 BASE) MCG/ACT inhaler Inhale 2 puffs into the lungs every 6 (six) hours as needed for wheezing or shortness of breath.    Marland Kitchen. amoxicillin-clavulanate (AUGMENTIN) 875-125 MG tablet Take 1 tablet by mouth every 12 (twelve) hours. (Patient not taking: Reported on 07/11/2017) 14 tablet 0  . benzocaine (HURRICAINE) 20 % GEL Use as directed 1 application in the mouth or throat 3 (three) times daily as needed. (  Patient not taking: Reported on 07/11/2017) 7 g 0  . cetirizine (ZYRTEC ALLERGY) 10 MG tablet Take 1 tablet (10 mg total) by mouth daily as needed (nasal congestion). (Patient not taking: Reported on 07/11/2017) 20 tablet 0  . oxyCODONE (ROXICODONE) 5 MG immediate release tablet Take 1 tablet (5 mg total) by mouth every 4 (four) hours as needed for severe pain. 8 tablet 0  . oxyCODONE-acetaminophen (PERCOCET/ROXICET) 5-325 MG tablet Take 1 tablet by mouth every 6 (six) hours as needed for severe pain. (Patient not taking: Reported on 07/11/2017) 30 tablet 0  . [DISCONTINUED] fexofenadine (ALLEGRA) 60 MG tablet Take 1 tablet (60 mg total) by mouth 2 (two) times daily. (Patient not taking: Reported on 12/10/2014) 60 tablet 0  . [DISCONTINUED] sodium  chloride (OCEAN) 0.65 % SOLN nasal spray Place 1 spray into both nostrils as needed for congestion. (Patient not taking: Reported on 11/09/2014) 30 mL 0   Allergies  Allergen Reactions  . Benadryl [Diphenhydramine Hcl] Other (See Comments)    Causes seizure activity  . Vicodin [Hydrocodone-Acetaminophen] Rash and Other (See Comments)    And seizures    ROS:  Review of Systems  Constitutional: Negative.   Respiratory: Negative.   Cardiovascular: Negative.   Gastrointestinal: Negative for abdominal pain, constipation, diarrhea, nausea and rectal pain.  Genitourinary: Positive for vaginal bleeding. Negative for difficulty urinating, dysuria, frequency, pelvic pain, urgency, vaginal discharge and vaginal pain.  Musculoskeletal: Negative.   Neurological: Negative.   Psychiatric/Behavioral: Negative.    I have reviewed patient's Past Medical Hx, Surgical Hx, Family Hx, Social Hx, medications and allergies.   Physical Exam   Patient Vitals for the past 24 hrs:  BP Temp Temp src Pulse Resp SpO2 Height Weight  07/11/17 1320 124/82 98.4 F (36.9 C) Oral 90 20 100 % 5\' 9"  (1.753 m) 163 lb 4 oz (74 kg)   Constitutional: Well-developed, well-nourished female in no acute distress.  Cardiovascular: normal rate Respiratory: normal effort GI: Abd soft, non-tender. Pos BS x 4 MS: Extremities nontender, no edema, normal ROM Neurologic: Alert and oriented x 4.  GU: Neg CVAT.  PELVIC EXAM: Cervix pink, visually closed, without lesion, scant dark red bleeding present with no clots, vaginal walls and external genitalia normal Bimanual exam: Cervix 0/long/high, firm, anterior, neg CMT, uterus nontender, nonenlarged, adnexa without tenderness, enlargement, or mass  LAB RESULTS Results for orders placed or performed during the hospital encounter of 07/11/17 (from the past 24 hour(s))  Urinalysis, Routine w reflex microscopic     Status: Abnormal   Collection Time: 07/11/17  1:25 PM  Result Value  Ref Range   Color, Urine YELLOW YELLOW   APPearance CLEAR CLEAR   Specific Gravity, Urine 1.024 1.005 - 1.030   pH 6.0 5.0 - 8.0   Glucose, UA NEGATIVE NEGATIVE mg/dL   Hgb urine dipstick MODERATE (A) NEGATIVE   Bilirubin Urine NEGATIVE NEGATIVE   Ketones, ur NEGATIVE NEGATIVE mg/dL   Protein, ur NEGATIVE NEGATIVE mg/dL   Nitrite NEGATIVE NEGATIVE   Leukocytes, UA TRACE (A) NEGATIVE   RBC / HPF 0-5 0 - 5 RBC/hpf   WBC, UA 0-5 0 - 5 WBC/hpf   Bacteria, UA NONE SEEN NONE SEEN   Squamous Epithelial / LPF 0-5 (A) NONE SEEN   Mucus PRESENT   Wet prep, genital     Status: Abnormal   Collection Time: 07/11/17  2:10 PM  Result Value Ref Range   Yeast Wet Prep HPF POC NONE SEEN NONE SEEN  Trich, Wet Prep NONE SEEN NONE SEEN   Clue Cells Wet Prep HPF POC NONE SEEN NONE SEEN   WBC, Wet Prep HPF POC MANY (A) NONE SEEN   Sperm NONE SEEN   Pregnancy, urine POC     Status: Abnormal   Collection Time: 07/11/17  2:19 PM  Result Value Ref Range   Preg Test, Ur POSITIVE (A) NEGATIVE  CBC     Status: None   Collection Time: 07/11/17  2:27 PM  Result Value Ref Range   WBC 8.8 4.0 - 10.5 K/uL   RBC 4.47 3.87 - 5.11 MIL/uL   Hemoglobin 13.9 12.0 - 15.0 g/dL   HCT 40.9 81.1 - 91.4 %   MCV 91.3 78.0 - 100.0 fL   MCH 31.1 26.0 - 34.0 pg   MCHC 34.1 30.0 - 36.0 g/dL   RDW 78.2 95.6 - 21.3 %   Platelets 359 150 - 400 K/uL  ABO/Rh     Status: None   Collection Time: 07/11/17  2:27 PM  Result Value Ref Range   ABO/RH(D) A POS   hCG, quantitative, pregnancy     Status: Abnormal   Collection Time: 07/11/17  2:27 PM  Result Value Ref Range   hCG, Beta Chain, Quant, S 16,536 (H) <5 mIU/mL    --/--/A POS (12/18 1427)  IMAGING US Ob Comp Less 14 Wks  Result Date: 07/11/2017 CLINICAL DATA:  Pregnant, bleeding x2 weeks EXAM: OBSTETRIC <14 WK [place] AND TRANSVAGINAL OB US TECHNIQUE: Both transabdominal and transvaginal ultrasound examinations were performed for complete evaluation of the gestation as  well as the maternal uterus, adnexal regions, and pelvic cul-de-sac. Transvaginal technique was performed to assess early pregnancy. COMPARISON:  None. FINDINGS: Intrauterine gestational sac: Single Yolk sac:  Visualized. Embryo:  Visualized. Cardiac Activity: Visualized. Heart Rate: 101  bpm CRL:  2.3  mm   5 w   5 d                  [place] EDC: 03/08/2018 Subchorionic hemorrhage:  None visualized. Maternal uterus/adnexae: 4.5 x 2.9 x 3.9 cm subserosal lower uterine body fibroid. Left ovary is within normal limits. Right ovary is notable for a 3.4 x 2.7 x 1.9 cm corpus luteal cyst and a simple cyst. No pelvic fluid. IMPRESSION: Single live intrauterine gestation, measuring 5 weeks 5 days by crown-rump length, as above. Electronically Signed   By: Charline Bills M.D.   On: 07/11/2017 16:26   US Ob Transvaginal  Result Date: 07/11/2017 CLINICAL DATA:  Pregnant, bleeding x2 weeks EXAM: OBSTETRIC <14 WK [place] AND TRANSVAGINAL OB US TECHNIQUE: Both transabdominal and transvaginal ultrasound examinations were performed for complete evaluation of the gestation as well as the maternal uterus, adnexal regions, and pelvic cul-de-sac. Transvaginal technique was performed to assess early pregnancy. COMPARISON:  None. FINDINGS: Intrauterine gestational sac: Single Yolk sac:  Visualized. Embryo:  Visualized. Cardiac Activity: Visualized. Heart Rate: 101  bpm CRL:  2.3  mm   5 w   5 d                  [place] EDC: 03/08/2018 Subchorionic hemorrhage:  None visualized. Maternal uterus/adnexae: 4.5 x 2.9 x 3.9 cm subserosal lower uterine body fibroid. Left ovary is within normal limits. Right ovary is notable for a 3.4 x 2.7 x 1.9 cm corpus luteal cyst and a simple cyst. No pelvic fluid. IMPRESSION: Single live intrauterine gestation, measuring 5 weeks 5 days by crown-rump length, as above. Electronically Signed  By: Charline BillsSriyesh  Krishnan M.D.   On: 07/11/2017 16:26    MAU Management/MDM: Ordered labs and reviewed results. Orders  Placed This Encounter  Procedures  . Wet prep, genital  . US OB Comp Less 14 Wks  . US OB Transvaginal  . Urinalysis, Routine w reflex microscopic  . CBC  . hCG, quantitative, pregnancy  . Pregnancy, urine POC  . ABO/Rh  . Discharge patient Discharge disposition: 01-Home or Self Care; Discharge patient date: 07/11/2017   Treatments in MAU included 250mg  rocephin and 1,000mg  azithromycin for GC/C.  Meds ordered this encounter  Medications  . AND Linked Order Group   . azithromycin (ZITHROMAX) tablet 1,000 mg   . cefTRIAXone (ROCEPHIN) injection 250 mg     Order Specific Question:   Antibiotic Indication:     Answer:   STD   Pt discharged with instructions and information on preventing unwanted pregnancies in the future and preventing STDs.  ASSESSMENT 1. Normal IUP (intrauterine pregnancy) on prenatal ultrasound, first trimester   2. Vaginal bleeding in pregnancy, first trimester   3. Exposure to sexually transmitted disease (STD)     PLAN Discharge home Patient plans termination of unwanted pregnancy- discussed that if she decides the keep the pregnancy there are certain seizure medication that she is on that is contraindicated and would need to schedule appointment with neurologist.    Allergies as of 07/11/2017      Reactions   Benadryl [diphenhydramine Hcl] Other (See Comments)   Causes seizure activity   Vicodin [hydrocodone-acetaminophen] Rash, Other (See Comments)   And seizures      Medication List    STOP taking these medications   divalproex 250 MG DR tablet Commonly known as:  DEPAKOTE     TAKE these medications   albuterol 108 (90 Base) MCG/ACT inhaler Commonly known as:  PROVENTIL HFA;VENTOLIN HFA Inhale 2 puffs into the lungs every 6 (six) hours as needed for wheezing or shortness of breath.   albuterol 108 (90 Base) MCG/ACT inhaler Commonly known as:  PROVENTIL HFA;VENTOLIN HFA Inhale 2 puffs into the lungs every 6 (six) hours as needed for  wheezing or shortness of breath.   ALPRAZolam 1 MG tablet Commonly known as:  XANAX Take 1 mg by mouth 3 (three) times daily as needed for anxiety.   amoxicillin-clavulanate 875-125 MG tablet Commonly known as:  AUGMENTIN Take 1 tablet by mouth every 12 (twelve) hours.   benzocaine 20 % Gel Commonly known as:  HURRICAINE Use as directed 1 application in the mouth or throat 3 (three) times daily as needed.   buPROPion 150 MG 24 hr tablet Commonly known as:  WELLBUTRIN XL Take 150 mg by mouth daily.   cetirizine 10 MG tablet Commonly known as:  ZYRTEC ALLERGY Take 1 tablet (10 mg total) by mouth daily as needed (nasal congestion).   citalopram 20 MG tablet Commonly known as:  CELEXA TAKE 1 TABLET BY MOUTH EVERY MORNING AS DIRECTED   oxyCODONE 5 MG immediate release tablet Commonly known as:  ROXICODONE Take 1 tablet (5 mg total) by mouth every 4 (four) hours as needed for severe pain.   oxyCODONE-acetaminophen 5-325 MG tablet Commonly known as:  PERCOCET/ROXICET Take 1 tablet by mouth every 6 (six) hours as needed for severe pain.       Steward DroneVeronica Alda Gaultney  Certified Nurse-Midwife 07/11/2017  3:13 PM

## 2017-07-12 LAB — GC/CHLAMYDIA PROBE AMP (~~LOC~~) NOT AT ARMC
Chlamydia: NEGATIVE
Neisseria Gonorrhea: NEGATIVE

## 2017-10-06 ENCOUNTER — Encounter (HOSPITAL_COMMUNITY): Payer: Self-pay

## 2017-10-06 ENCOUNTER — Other Ambulatory Visit: Payer: Self-pay

## 2017-10-06 ENCOUNTER — Inpatient Hospital Stay (HOSPITAL_COMMUNITY)
Admission: AD | Admit: 2017-10-06 | Discharge: 2017-10-12 | DRG: 779 | Disposition: A | Discharge status: Home / Self Care | Payer: Medicaid Other | Source: Ambulatory Visit | Attending: Obstetrics & Gynecology | Admitting: Obstetrics & Gynecology

## 2017-10-06 DIAGNOSIS — O3432 Maternal care for cervical incompetence, second trimester: Secondary | ICD-10-CM | POA: Diagnosis present

## 2017-10-06 DIAGNOSIS — I340 Nonrheumatic mitral (valve) insufficiency: Secondary | ICD-10-CM | POA: Diagnosis not present

## 2017-10-06 DIAGNOSIS — Z3A18 18 weeks gestation of pregnancy: Secondary | ICD-10-CM

## 2017-10-06 DIAGNOSIS — Z915 Personal history of self-harm: Secondary | ICD-10-CM

## 2017-10-06 DIAGNOSIS — Z9151 Personal history of suicidal behavior: Secondary | ICD-10-CM

## 2017-10-06 DIAGNOSIS — O42112 Preterm premature rupture of membranes, onset of labor more than 24 hours following rupture, second trimester: Secondary | ICD-10-CM | POA: Diagnosis not present

## 2017-10-06 DIAGNOSIS — O3430 Maternal care for cervical incompetence, unspecified trimester: Secondary | ICD-10-CM | POA: Diagnosis present

## 2017-10-06 DIAGNOSIS — R569 Unspecified convulsions: Secondary | ICD-10-CM

## 2017-10-06 DIAGNOSIS — F4310 Post-traumatic stress disorder, unspecified: Secondary | ICD-10-CM | POA: Diagnosis present

## 2017-10-06 DIAGNOSIS — O4102X0 Oligohydramnios, second trimester, not applicable or unspecified: Secondary | ICD-10-CM | POA: Diagnosis present

## 2017-10-06 DIAGNOSIS — O411220 Chorioamnionitis, second trimester, not applicable or unspecified: Secondary | ICD-10-CM | POA: Diagnosis present

## 2017-10-06 DIAGNOSIS — F1110 Opioid abuse, uncomplicated: Secondary | ICD-10-CM | POA: Diagnosis present

## 2017-10-06 DIAGNOSIS — O99322 Drug use complicating pregnancy, second trimester: Secondary | ICD-10-CM | POA: Diagnosis present

## 2017-10-06 DIAGNOSIS — O039 Complete or unspecified spontaneous abortion without complication: Principal | ICD-10-CM | POA: Diagnosis present

## 2017-10-06 DIAGNOSIS — O09292 Supervision of pregnancy with other poor reproductive or obstetric history, second trimester: Secondary | ICD-10-CM

## 2017-10-06 DIAGNOSIS — O0387 Sepsis following complete or unspecified spontaneous abortion: Secondary | ICD-10-CM | POA: Diagnosis present

## 2017-10-06 DIAGNOSIS — F1410 Cocaine abuse, uncomplicated: Secondary | ICD-10-CM | POA: Diagnosis present

## 2017-10-06 DIAGNOSIS — O0990 Supervision of high risk pregnancy, unspecified, unspecified trimester: Secondary | ICD-10-CM

## 2017-10-06 DIAGNOSIS — F319 Bipolar disorder, unspecified: Secondary | ICD-10-CM | POA: Diagnosis present

## 2017-10-06 DIAGNOSIS — F1220 Cannabis dependence, uncomplicated: Secondary | ICD-10-CM | POA: Diagnosis present

## 2017-10-06 DIAGNOSIS — O034 Incomplete spontaneous abortion without complication: Secondary | ICD-10-CM

## 2017-10-06 DIAGNOSIS — A419 Sepsis, unspecified organism: Secondary | ICD-10-CM | POA: Diagnosis present

## 2017-10-06 DIAGNOSIS — N883 Incompetence of cervix uteri: Secondary | ICD-10-CM | POA: Diagnosis present

## 2017-10-06 DIAGNOSIS — O209 Hemorrhage in early pregnancy, unspecified: Secondary | ICD-10-CM | POA: Diagnosis present

## 2017-10-06 DIAGNOSIS — O42912 Preterm premature rupture of membranes, unspecified as to length of time between rupture and onset of labor, second trimester: Secondary | ICD-10-CM | POA: Diagnosis present

## 2017-10-06 DIAGNOSIS — F1120 Opioid dependence, uncomplicated: Secondary | ICD-10-CM | POA: Diagnosis present

## 2017-10-06 HISTORY — DX: Cocaine abuse, uncomplicated: F14.10

## 2017-10-06 HISTORY — DX: Post-traumatic stress disorder, unspecified: F43.10

## 2017-10-06 HISTORY — DX: Cannabis dependence, uncomplicated: F12.20

## 2017-10-06 HISTORY — DX: Bipolar disorder, unspecified: F31.9

## 2017-10-06 HISTORY — DX: Opioid abuse, uncomplicated: F11.10

## 2017-10-06 HISTORY — DX: Personal history of self-harm: Z91.5

## 2017-10-06 LAB — CBC WITH DIFFERENTIAL/PLATELET
Basophils Absolute: 0 10*3/uL (ref 0.0–0.1)
Basophils Relative: 0 %
EOS ABS: 0.1 10*3/uL (ref 0.0–0.7)
Eosinophils Relative: 1 %
HCT: 31.6 % — ABNORMAL LOW (ref 36.0–46.0)
HEMOGLOBIN: 11 g/dL — AB (ref 12.0–15.0)
LYMPHS ABS: 2.8 10*3/uL (ref 0.7–4.0)
LYMPHS PCT: 34 %
MCH: 30.7 pg (ref 26.0–34.0)
MCHC: 34.8 g/dL (ref 30.0–36.0)
MCV: 88.3 fL (ref 78.0–100.0)
Monocytes Absolute: 0.3 10*3/uL (ref 0.1–1.0)
Monocytes Relative: 4 %
Neutro Abs: 4.9 10*3/uL (ref 1.7–7.7)
Neutrophils Relative %: 61 %
Platelets: 241 10*3/uL (ref 150–400)
RBC: 3.58 MIL/uL — AB (ref 3.87–5.11)
RDW: 15.1 % (ref 11.5–15.5)
WBC: 8.1 10*3/uL (ref 4.0–10.5)

## 2017-10-06 LAB — URINALYSIS, ROUTINE W REFLEX MICROSCOPIC
BACTERIA UA: NONE SEEN
BILIRUBIN URINE: NEGATIVE
Glucose, UA: NEGATIVE mg/dL
Hgb urine dipstick: NEGATIVE
Ketones, ur: NEGATIVE mg/dL
Nitrite: NEGATIVE
Protein, ur: 30 mg/dL — AB
SPECIFIC GRAVITY, URINE: 1.032 — AB (ref 1.005–1.030)
pH: 5 (ref 5.0–8.0)

## 2017-10-06 LAB — TYPE AND SCREEN
ABO/RH(D): A POS
ANTIBODY SCREEN: NEGATIVE

## 2017-10-06 LAB — RAPID URINE DRUG SCREEN, HOSP PERFORMED
Amphetamines: NOT DETECTED
Barbiturates: NOT DETECTED
Benzodiazepines: NOT DETECTED
COCAINE: POSITIVE — AB
OPIATES: POSITIVE — AB
TETRAHYDROCANNABINOL: POSITIVE — AB

## 2017-10-06 MED ORDER — ZOLPIDEM TARTRATE 5 MG PO TABS
5.0000 mg | ORAL_TABLET | Freq: Every evening | ORAL | Status: DC | PRN
  Administered 2017-10-11 – 2017-10-12 (×2): 5 mg via ORAL
  Filled 2017-10-06 (×2): qty 1

## 2017-10-06 MED ORDER — DOCUSATE SODIUM 100 MG PO CAPS
100.0000 mg | ORAL_CAPSULE | Freq: Every day | ORAL | Status: DC
  Administered 2017-10-07 – 2017-10-12 (×6): 100 mg via ORAL
  Filled 2017-10-06 (×9): qty 1

## 2017-10-06 MED ORDER — LORAZEPAM 1 MG PO TABS
1.0000 mg | ORAL_TABLET | Freq: Three times a day (TID) | ORAL | Status: DC | PRN
  Administered 2017-10-06 – 2017-10-12 (×7): 1 mg via ORAL
  Filled 2017-10-06 (×7): qty 1

## 2017-10-06 MED ORDER — OXYCODONE HCL 5 MG/5ML PO SOLN
5.0000 mg | Freq: Once | ORAL | Status: AC
  Administered 2017-10-06: 5 mg via ORAL
  Filled 2017-10-06: qty 5

## 2017-10-06 MED ORDER — ACETAMINOPHEN 160 MG/5ML PO SOLN: 650.0000 mg | Freq: Once | ORAL | Status: DC

## 2017-10-06 MED ORDER — PRENATAL MULTIVITAMIN CH
1.0000 | ORAL_TABLET | Freq: Every day | ORAL | Status: DC
  Administered 2017-10-06 – 2017-10-12 (×7): 1 via ORAL
  Filled 2017-10-06 (×8): qty 1

## 2017-10-06 MED ORDER — CALCIUM CARBONATE ANTACID 500 MG PO CHEW: 2.0000 | CHEWABLE_TABLET | ORAL | Status: DC | PRN

## 2017-10-06 NOTE — Plan of Care (Signed)
Patient is resting in bed at this time. Denies any pain or discomfort. Denies any vaginal bleeding. Patient is oriented to Room 323. Call-bell given to Patient.

## 2017-10-06 NOTE — MAU Note (Addendum)
Patient presents leaking clear fluid since last night, denies odor. Patient was originally going to terminate this pregnancy.

## 2017-10-06 NOTE — H&P (Signed)
CSN: [id number]  Arrival date and time: 10/06/17 [id number]   First Provider Initiated Contact with Patient 10/06/17 [phone]         Chief Complaint  Patient presents with  . Vaginal Bleeding   HPI   [name] is a 32 y.o. female [id number] @ 7144w1d here in MAU with leaking of fluid. The patient has no prenatal care, and had the plan to terminate. 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  The leaking started last night around midnight. Felt a small gush and then noticed that her pajamas were wet. No pain. No bleeding. No recent intercourse. + fetal movement.           OB History    Gravida Para Term Preterm AB Living   5 2 1 1 2 1    SAB TAB Ectopic Multiple Live Births   1 1 0 0 1          Past Medical History:  Diagnosis Date  . Asthma    No inhaler use x 1 yr (2013)  . Headache   . Hypertension   . Lordosis   . Seizures (HCC)          Past Surgical History:  Procedure Laterality Date  . EYE SURGERY    . WISDOM TOOTH EXTRACTION     x4         Family History  Problem Relation Age of Onset  . Hypertension Maternal Grandmother   . Diabetes Maternal Grandmother   . Seizures Neg Hx     Social History        Tobacco Use  . Smoking status: Current Some Day Smoker    Packs/day: 0.50    Years: 10.00    Pack years: 5.00    Types: Cigarettes  . Smokeless tobacco: Never Used  Substance Use Topics  . Alcohol use: Yes    Alcohol/week: 0.0 oz    Comment: occasional  . Drug use: No    Allergies:       Allergies  Allergen Reactions  . Benadryl [Diphenhydramine Hcl] Other (See Comments)    Causes seizure activity  . Vicodin [Hydrocodone-Acetaminophen] Rash and Other (See Comments)    And seizures           Medications Prior to Admission  Medication Sig Dispense Refill Last Dose  . acetaminophen (TYLENOL) 325 MG tablet Take 650 mg by mouth every 6 (six) hours as needed  for mild pain or headache.   prn  . albuterol (PROVENTIL HFA;VENTOLIN HFA) 108 (90 Base) MCG/ACT inhaler Inhale 2 puffs into the lungs every 6 (six) hours as needed for wheezing or shortness of breath.   Past Week at Unknown time  . ALPRAZolam (XANAX) 1 MG tablet Take 1 mg by mouth 3 (three) times daily as needed for anxiety.   3 10/05/2017 at Unknown time  . oxyCODONE-acetaminophen (PERCOCET/ROXICET) 5-325 MG tablet Take 1 tablet by mouth every 6 (six) hours as needed for severe pain. 30 tablet 0 10/05/2017 at Unknown time  . amoxicillin-clavulanate (AUGMENTIN) 875-125 MG tablet Take 1 tablet by mouth every 12 (twelve) hours. (Patient not taking: Reported on 07/11/2017) 14 tablet 0 Not Taking at Unknown time  . benzocaine (HURRICAINE) 20 % GEL Use as directed 1 application in the mouth or throat 3 (three) times daily as needed. (Patient not taking: Reported on 07/11/2017) 7 g 0 Not Taking at Unknown time  . cetirizine (ZYRTEC ALLERGY) 10 MG  tablet Take 1 tablet (10 mg total) by mouth daily as needed (nasal congestion). (Patient not taking: Reported on 07/11/2017) 20 tablet 0 Not Taking at Unknown time  . oxyCODONE (ROXICODONE) 5 MG immediate release tablet Take 1 tablet (5 mg total) by mouth every 4 (four) hours as needed for severe pain. 8 tablet 0 06/20/2017      Results for orders placed or performed during the hospital encounter of 10/06/17 (from the past 48 hour(s))  Urinalysis, Routine w reflex microscopic     Status: Abnormal   Collection Time: 10/06/17  9:35 AM  Result Value Ref Range   Color, Urine YELLOW YELLOW   APPearance HAZY (A) CLEAR   Specific Gravity, Urine 1.032 (H) 1.005 - 1.030   pH 5.0 5.0 - 8.0   Glucose, UA NEGATIVE NEGATIVE mg/dL   Hgb urine dipstick NEGATIVE NEGATIVE   Bilirubin Urine NEGATIVE NEGATIVE   Ketones, ur NEGATIVE NEGATIVE mg/dL   Protein, ur 30 (A) NEGATIVE mg/dL   Nitrite NEGATIVE NEGATIVE   Leukocytes, UA SMALL (A) NEGATIVE   RBC / HPF 0-5 0 - 5  RBC/hpf   WBC, UA 6-30 0 - 5 WBC/hpf   Bacteria, UA NONE SEEN NONE SEEN   Squamous Epithelial / LPF 6-30 (A) NONE SEEN   Mucus PRESENT     Comment: Performed at [facility], [address], [place], [place] [address]  Urine rapid drug screen (hosp performed)     Status: Abnormal   Collection Time: 10/06/17  9:35 AM  Result Value Ref Range   Opiates POSITIVE (A) NONE DETECTED   Cocaine POSITIVE (A) NONE DETECTED   Benzodiazepines NONE DETECTED NONE DETECTED   Amphetamines NONE DETECTED NONE DETECTED   Tetrahydrocannabinol POSITIVE (A) NONE DETECTED   Barbiturates NONE DETECTED NONE DETECTED    Comment: (NOTE) DRUG SCREEN FOR MEDICAL PURPOSES ONLY.  IF CONFIRMATION IS NEEDED FOR ANY PURPOSE, NOTIFY LAB WITHIN 5 DAYS. LOWEST DETECTABLE LIMITS FOR URINE DRUG SCREEN Drug Class                     Cutoff (ng/mL) Amphetamine and metabolites    1000 Barbiturate and metabolites    200 Benzodiazepine                 200 Tricyclics and metabolites     300 Opiates and metabolites        300 Cocaine and metabolites        300 THC                            50 Performed at [facility], [address], [place], [place] [address]   Type and screen [facility]     Status: None   Collection Time: 10/06/17 10:50 AM  Result Value Ref Range   ABO/RH(D) A POS    Antibody Screen NEG    Sample Expiration      10/09/2017 Performed at [facility], [address], [place], [place] [address]   CBC with Differential     Status: Abnormal   Collection Time: 10/06/17 10:54 AM  Result Value Ref Range   WBC 8.1 4.0 - 10.5 K/uL   RBC 3.58 (L) 3.87 - 5.11 MIL/uL   Hemoglobin 11.0 (L) 12.0 - 15.0 g/dL   HCT 13.0 (L) 86.5 - 78.4 %   MCV 88.3 78.0 - 100.0 fL   MCH 30.7 26.0 - 34.0 pg  MCHC 34.8 30.0 - 36.0 g/dL   RDW 16.1 09.6 - 04.5 %   Platelets 241 150 - 400 K/uL   Neutrophils Relative % 61 %   Neutro Abs 4.9 1.7 - 7.7 K/uL   Lymphocytes Relative 34 %   Lymphs  Abs 2.8 0.7 - 4.0 K/uL   Monocytes Relative 4 %   Monocytes Absolute 0.3 0.1 - 1.0 K/uL   Eosinophils Relative 1 %   Eosinophils Absolute 0.1 0.0 - 0.7 K/uL   Basophils Relative 0 %   Basophils Absolute 0.0 0.0 - 0.1 K/uL    Comment: Performed at [facility], [address], [place], [place] [address]   Review of Systems  Constitutional: Negative for fever.  Gastrointestinal: Negative for abdominal pain.  Genitourinary: Positive for vaginal discharge.   Physical Exam   Blood pressure 134/81, temperature 97.9 F (36.6 C), resp. rate 16, height 5\' 9"  (1.753 m), weight 167 lb (75.8 kg), last menstrual period 06/01/2017.  Physical Exam  Constitutional: She is oriented to person, place, and time. She appears well-developed and well-nourished. No distress.  HENT:  Head: Normocephalic.  Eyes: Pupils are equal, round, and reactive to light.  GI: Soft. She exhibits no distension. There is no tenderness. There is no rebound.  Genitourinary:  Genitourinary Comments: Cervix - hour glassing membranes noted, no pooling of fluid noted. Cervix is open  Bimanual exam: Cervix deferred  Chaperone present for exam.   Musculoskeletal: Normal range of motion.  Neurological: She is alert and oriented to person, place, and time.  Skin: Skin is warm. She is not diaphoretic.  Psychiatric: Her behavior is normal.    MAU Course  Procedures None  MDM + fetal rate via doppler  Discussed patient with Dr. Macon Large. Patient wishes to be admitted to the hospital without medical intervention at this time. Discussed cytotec vs D&C vs waiting. Patient voices understanding of all options.   Assessment and Plan   A: Inevitable abortion at [redacted] weeks gestation  Membranes bulging Positive UDS  P: Admit to the 3rd floor for further management  Rasch, Harolyn Rutherford, NP 10/06/2017  1:55 PM    Attestation of Attending Supervision of Advanced Practice Provider (PA/CNM/NP): Evaluation and  management procedures were performed by the Advanced Practice Provider under my supervision and collaboration.  I have reviewed the Advanced Practice Provider's note and chart, and I agree with the management and plan.  I went to the patient's room with her nurse.  Discussed the diagnosis of cervical incompetence, and talked to patient about the poor prognosis of BBOW in the vagina at 18 weeks given that the fetus is far from viability.  She became very agitated and combative and was screaming "you want to kill my baby".  The situation quickly escalated to where she was screaming at me and her nurse, and telling us that she "would do something if this baby dies.  Y'all killed my last baby too".  She was told there was no intervention that could be done at this point, but she can be expectantly managed for 48 hours and then go home if she was still pregnant and had no other critical issues.  She was also told that it was very unlikely she would remain pregnant until viability which will be in 5-6 weeks.  She was offered Chaplain services or other support; and she gestured to her tears tattoos on her face and said " Y'all know what these mean? I think you two need Chaplain services to  talk to your families".  She kept saying" I have mental illness, I can get away with anything".  This was interpreted as possible death threats to myself and the nurse and we left the room.  I was called back to discuss admission plan with the patient and her family. I returned with her nurse, [facility] and Engineer, materials.  Again, she started to yell and I reiterated the plan in front of her family members. Her mother was able to calm her down.  She was told that if she continued to threaten myself or any staff members, she will be discharged to home as she does not have any acute or critical condition a this moment and is under expectant management.    Patient's UDS was positive for cocaine, opiates and THC.  I was not able to discuss  this with her during this situation; but this will be discussed with her later. No need for ultrasound at this point as it will not change management. This may change later if she remains pregnant and cervical length measurement is needed to decide if cerclage placement will be possible for her.    Jaynie Collins, MD, FACOG Obstetrician & Gynecologist, [facility] for [employer], [facility]

## 2017-10-06 NOTE — Progress Notes (Signed)
Name: Clayton LefortHuntley, Kathleen  [facility]  Ashby DawesNature of Visit: Staff Care  Chaplain was told not to offer a spiritual care visit at the Pt's request. Chaplain did check in on staff to see how they were going.

## 2017-10-06 NOTE — MAU Provider Note (Signed)
History     CSN: [id number]  Arrival date and time: 10/06/17 [id number]   First Provider Initiated Contact with Patient 10/06/17 [phone]      Chief Complaint  Patient presents with  . Vaginal Bleeding   HPI   Ms.Beryle Quantrlie E Monteverde is a 32 y.o. female [phone] @ 7330w1d here in MAU with leaking of fluid. The patient has no prenatal care, and had the plan to terminate. 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  The leaking started last night around midnight. Felt a small gush and then noticed that her pajamas were wet. No pain. No bleeding. No recent intercourse. + fetal movement.   OB History    Gravida Para Term Preterm AB Living   5 2 1 1 2 1    SAB TAB Ectopic Multiple Live Births   1 1 0 0 1      Past Medical History:  Diagnosis Date  . Asthma    No inhaler use x 1 yr (2013)  . Headache   . Hypertension   . Lordosis   . Seizures (HCC)     Past Surgical History:  Procedure Laterality Date  . EYE SURGERY    . WISDOM TOOTH EXTRACTION     x4    Family History  Problem Relation Age of Onset  . Hypertension Maternal Grandmother   . Diabetes Maternal Grandmother   . Seizures Neg Hx     Social History   Tobacco Use  . Smoking status: Current Some Day Smoker    Packs/day: 0.50    Years: 10.00    Pack years: 5.00    Types: Cigarettes  . Smokeless tobacco: Never Used  Substance Use Topics  . Alcohol use: Yes    Alcohol/week: 0.0 oz    Comment: occasional  . Drug use: No    Allergies:  Allergies  Allergen Reactions  . Benadryl [Diphenhydramine Hcl] Other (See Comments)    Causes seizure activity  . Vicodin [Hydrocodone-Acetaminophen] Rash and Other (See Comments)    And seizures    Medications Prior to Admission  Medication Sig Dispense Refill Last Dose  . acetaminophen (TYLENOL) 325 MG tablet Take 650 mg by mouth every 6 (six) hours as needed for mild pain or headache.   prn  . albuterol (PROVENTIL HFA;VENTOLIN HFA) 108 (90  Base) MCG/ACT inhaler Inhale 2 puffs into the lungs every 6 (six) hours as needed for wheezing or shortness of breath.   Past Week at Unknown time  . ALPRAZolam (XANAX) 1 MG tablet Take 1 mg by mouth 3 (three) times daily as needed for anxiety.   3 10/05/2017 at Unknown time  . oxyCODONE-acetaminophen (PERCOCET/ROXICET) 5-325 MG tablet Take 1 tablet by mouth every 6 (six) hours as needed for severe pain. 30 tablet 0 10/05/2017 at Unknown time  . amoxicillin-clavulanate (AUGMENTIN) 875-125 MG tablet Take 1 tablet by mouth every 12 (twelve) hours. (Patient not taking: Reported on 07/11/2017) 14 tablet 0 Not Taking at Unknown time  . benzocaine (HURRICAINE) 20 % GEL Use as directed 1 application in the mouth or throat 3 (three) times daily as needed. (Patient not taking: Reported on 07/11/2017) 7 g 0 Not Taking at Unknown time  . cetirizine (ZYRTEC ALLERGY) 10 MG tablet Take 1 tablet (10 mg total) by mouth daily as needed (nasal congestion). (Patient not taking: Reported on 07/11/2017) 20 tablet 0 Not Taking at Unknown time  . oxyCODONE (ROXICODONE) 5 MG immediate release tablet  Take 1 tablet (5 mg total) by mouth every 4 (four) hours as needed for severe pain. 8 tablet 0 06/20/2017   Results for orders placed or performed during the hospital encounter of 10/06/17 (from the past 48 hour(s))  Urinalysis, Routine w reflex microscopic     Status: Abnormal   Collection Time: 10/06/17  9:35 AM  Result Value Ref Range   Color, Urine YELLOW YELLOW   APPearance HAZY (A) CLEAR   Specific Gravity, Urine 1.032 (H) 1.005 - 1.030   pH 5.0 5.0 - 8.0   Glucose, UA NEGATIVE NEGATIVE mg/dL   Hgb urine dipstick NEGATIVE NEGATIVE   Bilirubin Urine NEGATIVE NEGATIVE   Ketones, ur NEGATIVE NEGATIVE mg/dL   Protein, ur 30 (A) NEGATIVE mg/dL   Nitrite NEGATIVE NEGATIVE   Leukocytes, UA SMALL (A) NEGATIVE   RBC / HPF 0-5 0 - 5 RBC/hpf   WBC, UA 6-30 0 - 5 WBC/hpf   Bacteria, UA NONE SEEN NONE SEEN   Squamous Epithelial  / LPF 6-30 (A) NONE SEEN   Mucus PRESENT     Comment: Performed at [facility], [address] [place], [place] [address]  Urine rapid drug screen (hosp performed)     Status: Abnormal   Collection Time: 10/06/17  9:35 AM  Result Value Ref Range   Opiates POSITIVE (A) NONE DETECTED   Cocaine POSITIVE (A) NONE DETECTED   Benzodiazepines NONE DETECTED NONE DETECTED   Amphetamines NONE DETECTED NONE DETECTED   Tetrahydrocannabinol POSITIVE (A) NONE DETECTED   Barbiturates NONE DETECTED NONE DETECTED    Comment: (NOTE) DRUG SCREEN FOR MEDICAL PURPOSES ONLY.  IF CONFIRMATION IS NEEDED FOR ANY PURPOSE, NOTIFY LAB WITHIN 5 DAYS. LOWEST DETECTABLE LIMITS FOR URINE DRUG SCREEN Drug Class                     Cutoff (ng/mL) Amphetamine and metabolites    1000 Barbiturate and metabolites    200 Benzodiazepine                 200 Tricyclics and metabolites     300 Opiates and metabolites        300 Cocaine and metabolites        300 THC                            50 Performed at [facility], [address], [place], [place] [address]   Type and screen [facility]     Status: None   Collection Time: 10/06/17 10:50 AM  Result Value Ref Range   ABO/RH(D) A POS    Antibody Screen NEG    Sample Expiration      10/09/2017 Performed at [facility], [address], [place], [place] [address]   CBC with Differential     Status: Abnormal   Collection Time: 10/06/17 10:54 AM  Result Value Ref Range   WBC 8.1 4.0 - 10.5 K/uL   RBC 3.58 (L) 3.87 - 5.11 MIL/uL   Hemoglobin 11.0 (L) 12.0 - 15.0 g/dL   HCT 91.4 (L) 78.2 - 95.6 %   MCV 88.3 78.0 - 100.0 fL   MCH 30.7 26.0 - 34.0 pg   MCHC 34.8 30.0 - 36.0 g/dL   RDW 21.3 08.6 - 57.8 %   Platelets 241 150 - 400 K/uL   Neutrophils Relative % 61 %   Neutro Abs 4.9 1.7 - 7.7 K/uL  Lymphocytes Relative 34 %   Lymphs Abs 2.8 0.7 - 4.0 K/uL   Monocytes Relative 4 %   Monocytes Absolute 0.3 0.1 - 1.0 K/uL    Eosinophils Relative 1 %   Eosinophils Absolute 0.1 0.0 - 0.7 K/uL   Basophils Relative 0 %   Basophils Absolute 0.0 0.0 - 0.1 K/uL    Comment: Performed at [facility], [address] [place], [place] [address]   Review of Systems  Constitutional: Negative for fever.  Gastrointestinal: Negative for abdominal pain.  Genitourinary: Positive for vaginal discharge.   Physical Exam   Blood pressure 134/81, temperature 97.9 F (36.6 C), resp. rate 16, height 5\' 9"  (1.753 m), weight 167 lb (75.8 kg), last menstrual period 06/01/2017.  Physical Exam  Constitutional: She is oriented to person, place, and time. She appears well-developed and well-nourished. No distress.  HENT:  Head: Normocephalic.  Eyes: Pupils are equal, round, and reactive to light.  GI: Soft. She exhibits no distension. There is no tenderness. There is no rebound.  Genitourinary:  Genitourinary Comments: Cervix - hour glassing membranes noted, no pooling of fluid noted. Cervix is open  Bimanual exam: Cervix deferred  Chaperone present for exam.   Musculoskeletal: Normal range of motion.  Neurological: She is alert and oriented to person, place, and time.  Skin: Skin is warm. She is not diaphoretic.  Psychiatric: Her behavior is normal.    MAU Course  Procedures  None  MDM  + fetal rate via doppler  Discussed patient with Dr. Macon Large. Patient wishes to be admitted to the hospital without medical intervention at this time. Discussed cytotec vs D&C vs waiting. Patient voices understanding of all options.   Assessment and Plan   A:  Inevitable abortion at [redacted] weeks gestation  Membranes bulging   P:  Admit to the 3rd floor   Charmin Aguiniga, Harolyn Rutherford, NP 10/06/2017 1:55 PM

## 2017-10-07 ENCOUNTER — Encounter (HOSPITAL_COMMUNITY): Payer: Self-pay | Admitting: Obstetrics & Gynecology

## 2017-10-07 ENCOUNTER — Inpatient Hospital Stay (HOSPITAL_COMMUNITY): Payer: Medicaid Other

## 2017-10-07 DIAGNOSIS — O039 Complete or unspecified spontaneous abortion without complication: Principal | ICD-10-CM

## 2017-10-07 DIAGNOSIS — N883 Incompetence of cervix uteri: Secondary | ICD-10-CM | POA: Diagnosis present

## 2017-10-07 LAB — HIV ANTIBODY (ROUTINE TESTING W REFLEX): HIV Screen 4th Generation wRfx: NONREACTIVE

## 2017-10-07 LAB — RPR: RPR: NONREACTIVE

## 2017-10-07 MED ORDER — LACTATED RINGERS IV SOLN
INTRAVENOUS | Status: DC
  Administered 2017-10-07 – 2017-10-11 (×6): via INTRAVENOUS

## 2017-10-07 MED ORDER — KETOROLAC TROMETHAMINE 30 MG/ML IJ SOLN
30.0000 mg | Freq: Four times a day (QID) | INTRAMUSCULAR | Status: DC | PRN
  Administered 2017-10-07 – 2017-10-09 (×3): 30 mg via INTRAVENOUS
  Filled 2017-10-07 (×4): qty 1

## 2017-10-07 NOTE — Progress Notes (Signed)
Sterile speculum exam per Dr. Debroah LoopArnold. No pooling or hourglassing of membranes through cervix noted by MD. Yellow vaginal discharge noted on speculum. Patient stated "So y'all just aren't going to do anything and you're just going to let my baby die. I already have one lawsuit against this hospital."

## 2017-10-07 NOTE — Progress Notes (Signed)
US tech reports today's scan shows oligohydramnios c/w gross rupture which I reported to the patient and her family. She expressed the belief that a fetus at 4 months could survive at delivery. I related to her that survival at less than 23 weeks is very low. I told her expectant management could be done but cerclage or replacement of fluid is not possible. She wants to wait to talk to Dr. Despina HiddenEure in the morning after I offered to answer any more questions at this time. I expressed condolences to her for her current unfortunate situation.  Adam PhenixArnold, Alinah Sheard G, MD 10/07/2017 5:18 PM

## 2017-10-07 NOTE — Progress Notes (Signed)
Asked nurse if there was a plan to start IV fluids; when this nurse responded that there was not, she inquired "what is going to be done to save my baby, because I'm not ready to give up." Patient requested that I speak with physician on call and ask him to come speak with her regarding her plan of care. Dr. Debroah LoopArnold called and notified of patient's request.

## 2017-10-07 NOTE — Progress Notes (Signed)
Patient ID: Jessica Quantrlie E Kiernan, female   DOB: 09/20/1985, 32 y.o.   MRN: [id number] FACULTY PRACTICE ANTEPARTUM(COMPREHENSIVE) NOTE  Jessica Walton is a 32 y.o. [id number] with Estimated Date of Delivery: 03/08/18   By  early ultrasound 3036w2d  who is admitted for Trendelenberg position.    Fetal presentation is unsure. Length of Stay:  1  Days  Date of admission:10/06/2017  Subjective:  Patient reports the fetal movement as variable, too early for consistency. Patient reports uterine contraction  activity as none. Patient reports  vaginal bleeding as none. Patient describes fluid per vagina as None.  Vitals:  Blood pressure 112/68, pulse 85, temperature 98.7 F (37.1 C), temperature source Oral, resp. rate 18, height 5\' 9"  (1.753 m), weight 167 lb (75.8 kg), last menstrual period 06/01/2017, SpO2 99 %. Vitals:   10/06/17 1602 10/06/17 2000 10/07/17 0059 10/07/17 0800  BP: 116/65 104/67 117/62 112/68  Pulse: 90 72 74 85  Resp: 18 18 19 18   Temp: 97.8 F (36.6 C) 98.3 F (36.8 C) 98 F (36.7 C) 98.7 F (37.1 C)  TempSrc: Oral Oral Oral Oral  SpO2: 100% 100% 92% 99%  Weight:      Height:       Physical Examination:  General appearance - alert, well appearing, and in no distress Abdomen - soft, nontender, nondistended, no masses or organomegaly Fundal Height:  size equals dates Pelvic Exam:  examination not indicated Cervical Exam: Not evaluated.  Extremities: extremities normal, atraumatic, no cyanosis or edema with DTRs 2+ bilaterally Membranes:intact  Fetal Monitoring:       Labs:  Results for orders placed or performed during the hospital encounter of 10/06/17 (from the past 24 hour(s))  Urinalysis, Routine w reflex microscopic   Collection Time: 10/06/17  9:35 AM  Result Value Ref Range   Color, Urine YELLOW YELLOW   APPearance HAZY (A) CLEAR   Specific Gravity, Urine 1.032 (H) 1.005 - 1.030   pH 5.0 5.0 - 8.0   Glucose, UA NEGATIVE NEGATIVE mg/dL   Hgb urine dipstick  NEGATIVE NEGATIVE   Bilirubin Urine NEGATIVE NEGATIVE   Ketones, ur NEGATIVE NEGATIVE mg/dL   Protein, ur 30 (A) NEGATIVE mg/dL   Nitrite NEGATIVE NEGATIVE   Leukocytes, UA SMALL (A) NEGATIVE   RBC / HPF 0-5 0 - 5 RBC/hpf   WBC, UA 6-30 0 - 5 WBC/hpf   Bacteria, UA NONE SEEN NONE SEEN   Squamous Epithelial / LPF 6-30 (A) NONE SEEN   Mucus PRESENT   Urine rapid drug screen (hosp performed)   Collection Time: 10/06/17  9:35 AM  Result Value Ref Range   Opiates POSITIVE (A) NONE DETECTED   Cocaine POSITIVE (A) NONE DETECTED   Benzodiazepines NONE DETECTED NONE DETECTED   Amphetamines NONE DETECTED NONE DETECTED   Tetrahydrocannabinol POSITIVE (A) NONE DETECTED   Barbiturates NONE DETECTED NONE DETECTED  Type and screen [facility]   Collection Time: 10/06/17 10:50 AM  Result Value Ref Range   ABO/RH(D) A POS    Antibody Screen NEG    Sample Expiration      10/09/2017 Performed at [facility], [address] [place], [place] [id number]   CBC with Differential   Collection Time: 10/06/17 10:54 AM  Result Value Ref Range   WBC 8.1 4.0 - 10.5 K/uL   RBC 3.58 (L) 3.87 - 5.11 MIL/uL   Hemoglobin 11.0 (L) 12.0 - 15.0 g/dL   HCT 01.031.6 (L) 27.236.0 - 53.646.0 %   MCV  88.3 78.0 - 100.0 fL   MCH 30.7 26.0 - 34.0 pg   MCHC 34.8 30.0 - 36.0 g/dL   RDW 16.1 09.6 - 04.5 %   Platelets 241 150 - 400 K/uL   Neutrophils Relative % 61 %   Neutro Abs 4.9 1.7 - 7.7 K/uL   Lymphocytes Relative 34 %   Lymphs Abs 2.8 0.7 - 4.0 K/uL   Monocytes Relative 4 %   Monocytes Absolute 0.3 0.1 - 1.0 K/uL   Eosinophils Relative 1 %   Eosinophils Absolute 0.1 0.0 - 0.7 K/uL   Basophils Relative 0 %   Basophils Absolute 0.0 0.0 - 0.1 K/uL  HIV antibody   Collection Time: 10/06/17 10:54 AM  Result Value Ref Range   HIV Screen 4th Generation wRfx Non Reactive Non Reactive  RPR   Collection Time: 10/06/17 10:54 AM  Result Value Ref Range   RPR Ser Ql Non Reactive Non Reactive     Imaging Studies:      Medications:  Scheduled . acetaminophen (TYLENOL) oral liquid 160 mg/5 mL  650 mg Oral Once  . docusate sodium  100 mg Oral Daily  . prenatal multivitamin  1 tablet Oral Q1200   I have reviewed the patient's current medications.  ASSESSMENT: [id number] [redacted]w[redacted]d Estimated Date of Delivery: 03/08/18  Incompetent cervix with hourglassing membranes Patient Active Problem List   Diagnosis Date Noted  . Incompetency, cervical 10/07/2017  . Inevitable abortion 10/06/2017  . Cervical incompetence, antepartum 10/06/2017  . Tetrahydrocannabinol (THC) dependence (HCC)   . Seizures (HCC)   . PTSD (post-traumatic stress disorder)   . Bipolar 1 disorder (HCC)   . Opiate abuse (HCC) 07/25/2016  . Cocaine abuse (HCC) 07/25/2016  . Seizure, convulsion (HCC) 04/30/2015  . History of suicide attempt 07/25/2013  . Supervision of high-risk pregnancy 07/03/2013  . History of prior pregnancy with short cervix, currently pregnant in second trimester 07/03/2013    PLAN: In house observation in Trendelenberg, not a candidate for cerclage or pessary placement at this point  Pt is aware of the extrmemly low chance of positive outcome  Amaryllis Dyke Eure 10/07/2017,8:53 AM

## 2017-10-07 NOTE — Progress Notes (Signed)
Patient called out to nurse's station, stating "her water broke." Clear fluid noted running down patient's leg. Denies perineal pressure, vaginal bleeding, or pain. Dr. Debroah LoopArnold called and informed of findings.

## 2017-10-07 NOTE — Progress Notes (Signed)
Subjective: Patient reports fetal movement, no pain but noticed vaginal fluid leaked about 2 hr ago  Objective: I have reviewed patient's vital signs, medications and radiology results. Blood pressure 118/62, pulse 66, temperature 98.7 F (37.1 C), resp. rate 18, height 5\' 9"  (1.753 m), weight 75.8 kg (167 lb), last menstrual period 06/01/2017, SpO2 100 %.  General: alert, cooperative and no distress GI: gravid not tender, below umbilicus Vaginal Bleeding: none and white/yellow vaginal fluid noted on sterile vaginal exam, no visible membranes or blood   Assessment/Plan: Possible inevitable abortion need to assess for LOF or possible regression of bulging membranes. Complete US ordered  LOS: 1 day    Scheryl DarterJames Savahanna Almendariz 10/07/2017, 3:27 PM

## 2017-10-07 NOTE — Progress Notes (Signed)
Patient and family demanding that she be started on IV fluids "to replace the fluid that I've lost." Patient stated "I just don't feel that all resources have been exhausted to save my baby." Call placed to Dr. Debroah LoopArnold and spoke with Merleen Millineraria, nurse who answered call for Dr. Debroah LoopArnold, who is in surgery. Communicated patient's desire to have IV fluids initiated.

## 2017-10-07 NOTE — Progress Notes (Signed)
When nurse entered bathroom to place dirty linen in hamper, it was noted that wet towels were stuffed under the door and an odor consistent with tobacco smoke was noted in the air.

## 2017-10-08 NOTE — Plan of Care (Signed)
Patient is resting in bed at this time. Denies any pain or discomfort. Patient reports that she has had scant amount of vaginal leakage since yesterday, but denies any bleeding. Ambulates to the bathroom without any difficulty. Encouraged Patient to use callbell if assistance is needed. Patient verbalizes an understanding.

## 2017-10-08 NOTE — Progress Notes (Addendum)
2000: Writer attempted to take pts' B/P pressure, pt refused stating " someone took it 45 minutes ago. In fact no one took it because we did not have a NT.  2310: Writer notice pts' shower was running for several hours without anyone showering. Pt demanded that the shower be left on to humidify her room. We told her that running the shower was not a good idea and we provided her with humidified air with an 02 water bottle. Pt accepted it.  [id number]: Writer responded to patient's emergency call bell which was "a mistake" according to patient. However, writer found the room entrenched with the smell of smoke. Most of the smoke appeared to be coming from patients' bathroom into the room and escaped into the hallway as Clinical research associatewriter opened the door. Pt denies smoking and security was notified.

## 2017-10-08 NOTE — Progress Notes (Signed)
Call to Room 323 by Patient. Patient states, "I need to go outside and get some air. Is that okay?" Dr. Despina HiddenEure notified. Okay for Patient to leave unit and go outside in wheelchair per Dr. Despina HiddenEure. Patient made aware.

## 2017-10-08 NOTE — Progress Notes (Signed)
Dr. Eure at bedside. 

## 2017-10-09 ENCOUNTER — Inpatient Hospital Stay (HOSPITAL_COMMUNITY): Payer: Medicaid Other

## 2017-10-09 LAB — TYPE AND SCREEN
ABO/RH(D): A POS
Antibody Screen: NEGATIVE

## 2017-10-09 MED ORDER — AMOXICILLIN 500 MG PO CAPS
500.0000 mg | ORAL_CAPSULE | Freq: Three times a day (TID) | ORAL | Status: DC
  Administered 2017-10-09 – 2017-10-10 (×4): 500 mg via ORAL
  Filled 2017-10-09 (×5): qty 1

## 2017-10-09 MED ORDER — ACETAMINOPHEN 500 MG PO TABS
1000.0000 mg | ORAL_TABLET | Freq: Four times a day (QID) | ORAL | Status: DC | PRN
  Administered 2017-10-09 – 2017-10-11 (×3): 1000 mg via ORAL
  Filled 2017-10-09 (×3): qty 2

## 2017-10-09 MED ORDER — GENTAMICIN SULFATE 40 MG/ML IJ SOLN
180.0000 mg | Freq: Three times a day (TID) | INTRAVENOUS | Status: DC
  Administered 2017-10-09 – 2017-10-10 (×2): 180 mg via INTRAVENOUS
  Filled 2017-10-09 (×3): qty 4.5

## 2017-10-09 MED ORDER — OXYCODONE HCL 5 MG/5ML PO SOLN
10.0000 mg | Freq: Once | ORAL | Status: AC
  Administered 2017-10-09: 10 mg via ORAL
  Filled 2017-10-09: qty 10

## 2017-10-09 MED ORDER — AZITHROMYCIN 250 MG PO TABS
500.0000 mg | ORAL_TABLET | Freq: Every day | ORAL | Status: DC
  Administered 2017-10-09: 500 mg via ORAL
  Filled 2017-10-09: qty 2

## 2017-10-09 NOTE — Progress Notes (Signed)
Patient ID: Jessica Walton, female   DOB: 1986/01/30, 32 y.o.   MRN: [id number] FACULTY PRACTICE ANTEPARTUM(COMPREHENSIVE) NOTE  Jessica Walton is a 32 y.o. [id number] with Estimated Date of Delivery: 03/08/18   By  early ultrasound 5562w4d  who is admitted for incompetent cervix and ultimately PPROM.    Fetal presentation is unsure. Length of Stay:  3  Days  Date of admission:10/06/2017  Subjective: Pt states agreement with waiting for her consultations today before discharge home Patient reports the fetal movement as active. Patient reports uterine contraction  activity as none. Patient reports  vaginal bleeding as none. Patient describes fluid per vagina as None.  Vitals:  Blood pressure 122/82, pulse 97, temperature 98.1 F (36.7 C), temperature source Oral, resp. rate 20, height 5\' 9"  (1.753 m), weight 167 lb (75.8 kg), last menstrual period 06/01/2017, SpO2 100 %. Vitals:   10/08/17 1545 10/08/17 2016 10/08/17 2337 10/09/17 0412  BP: 108/61 109/65 117/65 122/82  Pulse: 71 73 89 97  Resp: 18 16 18 20   Temp: 99.6 F (37.6 C) 98.9 F (37.2 C) 98.1 F (36.7 C)   TempSrc: Oral Oral Oral   SpO2: 99% 100% 100% 100%  Weight:      Height:       Physical Examination:  General appearance - alert, well appearing, and in no distress Abdomen - soft, nontender, nondistended, no masses or organomegaly Fundal Height:  size equals dates Pelvic Exam:  deferred Cervical Exam: Not evaluated.  Extremities: extremities normal, atraumatic, no cyanosis or edema with DTRs  Membranes:intact, ruptured  Fetal Monitoring:    +FHT 160s  Labs:  No results found for this or any previous visit (from the past 24 hour(s)).  Imaging Studies:      Medications:  Scheduled . acetaminophen (TYLENOL) oral liquid 160 mg/5 mL  650 mg Oral Once  . amoxicillin  500 mg Oral Q8H  . azithromycin  500 mg Oral Daily  . docusate sodium  100 mg Oral Daily  . prenatal multivitamin  1 tablet Oral Q1200   I have  reviewed the patient's current medications.  ASSESSMENT: [id number] 4462w4d Estimated Date of Delivery: 03/08/18  Patient Active Problem List   Diagnosis Date Noted  . Incompetency, cervical 10/07/2017  . Inevitable abortion 10/06/2017  . Cervical incompetence, antepartum 10/06/2017  . Tetrahydrocannabinol (THC) dependence (HCC)   . Seizures (HCC)   . PTSD (post-traumatic stress disorder)   . Bipolar 1 disorder (HCC)   . Opiate abuse (HCC) 07/25/2016  . Cocaine abuse (HCC) 07/25/2016  . Seizure, convulsion (HCC) 04/30/2015  . History of suicide attempt 07/25/2013  . Supervision of high-risk pregnancy 07/03/2013  . History of prior pregnancy with short cervix, currently pregnant in second trimester 07/03/2013    PLAN: Await consults with neonatology and MFM for patient reassurance as far as plan of treatment is concerned  She is aware at this point outpatient management is appropriate and I have agreed to do 1 week of antibiotics, amoxicillin and zithromax  If she has not undergone spontaneous pregnancy loss prior then she will be readmitted around 23 weeks for antibiotics, steroids and in house observation   Lazaro ArmsLuther H Alexios Keown 10/09/2017,9:12 AM

## 2017-10-09 NOTE — Consult Note (Signed)
Asked by Dr.Eure to provide prenatal consultation for patient at risk for preterm delivery due to incompetent cervix and PPROM.  Mother is 89 y.o. G5 P1-1-2-1 who is now  18.[redacted] weeks EGA.  She was admitted 3/15 and will be discharged today on antibiotics, with plans to readmit around 23 weeks (if she doesn't deliver before that) for in-house observation, BMZ, etc.  Patient tells me she is afebrile and has stopped leaking fluid; she is optimistic for infant's ultimate survival.  I presented the uncertainty about limits of fetal viability and told her we resuscitate and support infants at 23 weeks if the parents choose this intervention, although many such infants do not survive or survive with significant morbidity. Briefly discussed expectations for NICU support at 23+ wks, including respiratory support, IVs, blood products, and feeding with mother's milk.  Advised her that other institutions (including [facility] offer support of infants < 23 wks and that she could request transfer to deliver elsewhere if desired.  She was attentive and expressed appreciation for my input.  Thank you for consulting Neonatology.  Total time 25 minutes  JWimmer, MD

## 2017-10-09 NOTE — Progress Notes (Addendum)
Pt rang call light saying she was bleeding.  When this RN entered room pt demanding "What did y'all give me to make me bleed like this.  I know y'all gave me something!"  Assured pt that she has received no medication to make her bleed.  Pt holding washcloth saturated with blood and sheet under pt saturated as well.  Lourdes SledgeJessica Walls, RN phoned Dr. Jolayne Pantheronstant and requested her come see patient.  Dr. Jolayne Pantheronstant to bedside promptly.  See her note regarding exam.  Multiple clots removed during exam.  Following exam, pt requested bedpan because she was afraid to go to bathroom.  Bedpan provided, but pt unable to void on bedpan.  BSC offered.  While pt up on BSC, NTs changed her bed and gown, and assisted with pericare.  Pt requested medicine for anxiety and pain.  Gave PO Ativan and IV Toradol.  Pt polite to RN at this time.

## 2017-10-09 NOTE — Progress Notes (Signed)
Faculty Practice OB/GYN Attending Note  After talking to my medical director and nursing leaders, I offered the patient the choice to refuse medical advice without leaving the hospital. Discussed that the standard of care was still proceeding towards delivery in the setting of previable PPROM and intrauterine infection. Patient adamantly refusing being induced, she wants to "miscarry on my own".  Discussed risks of worsening infection, sepsis, organ failure, death and she still refuses this treatment.  She signed the informed consent of refusal of treatment form as below (photo of consent taken with [employer] app). Consent signed with myself and Tia AlertPaige Grady, RN [facility](House Coverage) as witnesses.   Given that patient does not want to have me as her physician tonight, I offered that she can be evaluated as needed tonight by our OB fellow, Dr. Raynelle FanningJulie Degele. Emphasized that Dr. Nira Retortegele is under my supervision and I may still need to be directly involved if her condition worsens (sepsis, hemorrhage etc) or she needs a procedure tonight.  Dr. Catalina AntiguaPeggy Constant, my partner, will take over at 0800 tomorrow. We will continue antibiotic therapy with Ampicillin and Gentamicin for treatment of the intrauterine infection for now; even though the standard of care is still to proceed with delivery.  Patient agreed with and verbalized understanding of this plan.   Will continue close observation for now.   Jaynie CollinsUGONNA  Daylee Delahoz, MD, FACOG Obstetrician & Gynecologist, [facility] for [facility], [facility]

## 2017-10-09 NOTE — Progress Notes (Signed)
Called Dr. Lynetta MareAnyawu to inquire if patient could still leave the floor for w/c ride and she verified that patient could do so as long as her bleeding was still small.

## 2017-10-09 NOTE — Progress Notes (Signed)
Present at bedside with Dr. Macon LargeAnyanwu as she discussed plan of care with patient and family (mother and 31yo son). Due to developing intrauterine infection, MD recommended  induction of labor. Patient refused standard treatment and became upset with physician, asking her leave and come back later, "After I eat"; "You're making me loose my appetite". Upon retuning to her room,  MD reviewed potential risks of refusing delviery, including sepsis, organ failure and death.  Informed Consent to Refuse discussed and signed by patient. Questions answered and patient verbally confirmed her decision and understanding.

## 2017-10-09 NOTE — Progress Notes (Signed)
Dr. Lovett SoxAnywanwu went into patients room to discuss plan of care. Patient declined any of the advise that the doctor gave her at the time. Due to the risks patient was given an Informed Consent to refuse. Patient signed consent and I was given instructions to call her if there was any significant changes to mother or baby.

## 2017-10-09 NOTE — Progress Notes (Signed)
Spoke to Schering-PloughCrystal in Maternal Fetal Medicine.  She pulled up patient's chart, verified that MFM Consult order was present, and said she would make sure that the physician sees the order.

## 2017-10-09 NOTE — Progress Notes (Signed)
Off unit in w/c.

## 2017-10-09 NOTE — Progress Notes (Addendum)
Spoke to Dr. Eric FormWimmer to notify him of neonatology consult order.  He said he or one of his partners would see patient today.

## 2017-10-09 NOTE — Progress Notes (Signed)
Patient requested to be disconnected from IV. Patient told me she was going on a wheelchair ride at the time. D/c IV and took temperature before she left. Temp. Was 98.8.

## 2017-10-09 NOTE — Progress Notes (Signed)
Faculty Practice OB/GYN Attending Note  Subjective:  Called to evaluate patient with recent fever of 101.4 in the setting of PPROM at 66104w4d. I was called at the time of the fever but was on the way to OR for an urgent cesarean section.  I advised the RN to start Gentamicin in addition to her Ampicillin for treatment of intrauterine infection and I will come up afterwards to talk to her about need for proceed towards delivery.  Admitted on 10/06/2017 for Inevitable abortion.    Objective:  Blood pressure 124/77, pulse (!) 133, temperature 100.1 F (37.8 C), temperature source Oral, resp. rate 20, height 5\' 9"  (1.753 m), weight 167 lb (75.8 kg), last menstrual period 06/01/2017, SpO2 100 %. Tm 101.4 at 1955. Gen: Patient is very combative, and I am unable to examine her.  She kept yelling for me to get out og the HENT: Normocephalic, atraumatic Lungs: Normal respiratory effort Heart: Tachycardia noted Abdomen: P Cervix: Deferred Ext: 2+ DTRs, no edema, no cyanosis, negative Homan's sign  Assessment & Plan:  32 y.o. [id number] at 25104w4d with cervical incompetence, admitted for BBOW/inevitable abortion which progressed to previable PPROM and now with fevers. The standard of care is to proceed with delivery to reduce risk of sepsis. Patient was informed of this, in the presence of House Coverage RN and RN in charge of the unit, and she started yelling at me again. This is my second interaction with the patient. She said that she refuses to proceed with delivery as recommended and kept demanding for another physician. When she was told I was the only physician here, she became belligerent and verbally abusive and saying she will just stay here until tomorrow as previously planned, then go home.  I calmly told her and her family that that her situation has changed, she was now febrile and the standard of care was delivery.  If she refuses this intervention, she could leave AMA, as expectant management is  absolutely contraindicated.  She kept yelling, and I told her I will return in 30 minutes to hear her decision. Will continue close observation for now.  Security has been made made aware of this situation and they are on the floor.   Jaynie CollinsUGONNA  Aldwin Micalizzi, MD, FACOG Obstetrician & Gynecologist, [facility] for [facility] [employer], [facility]

## 2017-10-09 NOTE — Progress Notes (Signed)
Pharmacy Antibiotic Note  Jessica Walton is a 32 y.o. female admitted on 10/06/2017 with leaking of fluid at 18 + weeks gestation.  Pharmacy has been consulted for Gentamicin dosing for choioamnionitis in patient with incompetent cervix. Pt has h/o incompetent cervix with previous pregnancy and 30 week delivery  Plan: Gentamicin 180mg  IV q8h Will order SCr for am.   Height: 5\' 9"  (175.3 cm) Weight: 167 lb (75.8 kg) IBW/kg (Calculated) : 66.2  Dosing weight: 75.8kg  Temp (24hrs), Avg:99.2 F (37.3 C), Min:98.1 F (36.7 C), Max:101.4 F (38.6 C)  Recent Labs  Lab 10/06/17 1054  WBC 8.1    CrCl cannot be calculated (Patient's most recent lab result is older than the maximum 21 days allowed.).    Allergies  Allergen Reactions  . Benadryl [Diphenhydramine Hcl] Other (See Comments)    Causes seizure activity  . Vicodin [Hydrocodone-Acetaminophen] Rash and Other (See Comments)    And seizures    Antimicrobials this admission: Amoxicillin 500mg  po q8h  3/18 >> Azithromycin 500mg  po daily   3/18 >>    Thank you for allowing pharmacy to be a part of this patient's care.  Jessica Walton, Jessica Walton 10/09/2017 9:09 PM

## 2017-10-09 NOTE — Progress Notes (Signed)
Called by RN around 12:30 for the evaluation of patient with vaginal bleeding. Patient reports heavy vaginal bleeding and some abdominal cramping pain. She describes the bleeding as heavy with passage of clots noted when she went to the bathroom and returned to bed.  Blood pressure 124/77, pulse (!) 111, temperature 98.6 F (37 C), temperature source Oral, resp. rate 18, height 5\' 9"  (1.753 m), weight 167 lb (75.8 kg), last menstrual period 06/01/2017, SpO2 100 %.  GENERAL: Well-developed, well-nourished female in no acute distress.  ABDOMEN: Soft, nontender, nondistended.  SSE: Normal external female genitalia. Vagina is pink and rugated.  Copious amount of bright red blood in the vagina with the evacuation of approximately 100 cc blood clot. Cervix difficult to visualize.  EXTREMITIES: No cyanosis, clubbing, or edema, 2+ distal pulses.  Bedside ultrasound: viable fetus in breech presentation with oligohydramnios  A/P 32 yo [id number] at 5945w4d with PPROM and now heavy vaginal bleeding - Expressed concerns for miscarriage in process - Reviewed neonatology note with the patient - Awaiting MFM consult - Will consider discharge home tomorrow if bleeding and cramping subsides - Patient is agreeable with plan

## 2017-10-10 ENCOUNTER — Inpatient Hospital Stay (HOSPITAL_COMMUNITY): Payer: Medicaid Other

## 2017-10-10 DIAGNOSIS — O411220 Chorioamnionitis, second trimester, not applicable or unspecified: Secondary | ICD-10-CM

## 2017-10-10 DIAGNOSIS — O42112 Preterm premature rupture of membranes, onset of labor more than 24 hours following rupture, second trimester: Secondary | ICD-10-CM

## 2017-10-10 DIAGNOSIS — O3432 Maternal care for cervical incompetence, second trimester: Secondary | ICD-10-CM

## 2017-10-10 DIAGNOSIS — I340 Nonrheumatic mitral (valve) insufficiency: Secondary | ICD-10-CM

## 2017-10-10 LAB — COMPREHENSIVE METABOLIC PANEL
ALBUMIN: 2.5 g/dL — AB (ref 3.5–5.0)
ALBUMIN: 2.2 g/dL — AB (ref 3.5–5.0)
ALK PHOS: 58 U/L (ref 38–126)
ALT: 15 U/L (ref 14–54)
ALT: 13 U/L — AB (ref 14–54)
ANION GAP: 9 (ref 5–15)
AST: 23 U/L (ref 15–41)
AST: 18 U/L (ref 15–41)
Alkaline Phosphatase: 68 U/L (ref 38–126)
Anion gap: 9 (ref 5–15)
BILIRUBIN TOTAL: 0.9 mg/dL (ref 0.3–1.2)
BILIRUBIN TOTAL: 0.7 mg/dL (ref 0.3–1.2)
BUN: 14 mg/dL (ref 6–20)
BUN: 13 mg/dL (ref 6–20)
CALCIUM: 7.9 mg/dL — AB (ref 8.9–10.3)
CALCIUM: 7.2 mg/dL — AB (ref 8.9–10.3)
CO2: 21 mmol/L — ABNORMAL LOW (ref 22–32)
CO2: 17 mmol/L — ABNORMAL LOW (ref 22–32)
CREATININE: 1.15 mg/dL — AB (ref 0.44–1.00)
Chloride: 103 mmol/L (ref 101–111)
Chloride: 107 mmol/L (ref 101–111)
Creatinine, Ser: 1.23 mg/dL — ABNORMAL HIGH (ref 0.44–1.00)
GFR calc Af Amer: >60 mL/min (ref >60)
GFR calc Af Amer: >60 mL/min (ref >60)
GFR calc non Af Amer: >60 mL/min (ref >60)
GFR, EST NON AFRICAN AMERICAN: 58 mL/min — AB (ref >60)
GLUCOSE: 114 mg/dL — AB (ref 65–99)
GLUCOSE: 118 mg/dL — AB (ref 65–99)
Potassium: 2.8 mmol/L — ABNORMAL LOW (ref 3.5–5.1)
Potassium: 3 mmol/L — ABNORMAL LOW (ref 3.5–5.1)
Sodium: 133 mmol/L — ABNORMAL LOW (ref 135–145)
Sodium: 133 mmol/L — ABNORMAL LOW (ref 135–145)
TOTAL PROTEIN: 5.9 g/dL — AB (ref 6.5–8.1)
TOTAL PROTEIN: 5.1 g/dL — AB (ref 6.5–8.1)

## 2017-10-10 LAB — CBC
HEMATOCRIT: 21.4 % — AB (ref 36.0–46.0)
HEMOGLOBIN: 7.6 g/dL — AB (ref 12.0–15.0)
MCH: 31.1 pg (ref 26.0–34.0)
MCHC: 35.5 g/dL (ref 30.0–36.0)
MCV: 87.7 fL (ref 78.0–100.0)
Platelets: 149 10*3/uL — ABNORMAL LOW (ref 150–400)
RBC: 2.44 MIL/uL — ABNORMAL LOW (ref 3.87–5.11)
RDW: 15.3 % (ref 11.5–15.5)
WBC: 14 10*3/uL — ABNORMAL HIGH (ref 4.0–10.5)

## 2017-10-10 LAB — CBC WITH DIFFERENTIAL/PLATELET
Basophils Absolute: 0 10*3/uL (ref 0.0–0.1)
Basophils Relative: 0 %
Eosinophils Absolute: 0 10*3/uL (ref 0.0–0.7)
Eosinophils Relative: 0 %
HEMATOCRIT: 24.5 % — AB (ref 36.0–46.0)
HEMOGLOBIN: 8.6 g/dL — AB (ref 12.0–15.0)
LYMPHS ABS: 0.8 10*3/uL (ref 0.7–4.0)
LYMPHS PCT: 5 %
MCH: 30.8 pg (ref 26.0–34.0)
MCHC: 35.1 g/dL (ref 30.0–36.0)
MCV: 87.8 fL (ref 78.0–100.0)
MONO ABS: 0.3 10*3/uL (ref 0.1–1.0)
MONOS PCT: 2 %
NEUTROS PCT: 93 %
Neutro Abs: 15.3 10*3/uL — ABNORMAL HIGH (ref 1.7–7.7)
Platelets: 183 10*3/uL (ref 150–400)
RBC: 2.79 MIL/uL — ABNORMAL LOW (ref 3.87–5.11)
RDW: 15.4 % (ref 11.5–15.5)
WBC: 16.4 10*3/uL — ABNORMAL HIGH (ref 4.0–10.5)

## 2017-10-10 LAB — URINALYSIS, ROUTINE W REFLEX MICROSCOPIC
Bilirubin Urine: NEGATIVE
Glucose, UA: NEGATIVE mg/dL
Ketones, ur: NEGATIVE mg/dL
Nitrite: NEGATIVE
PH: 6 (ref 5.0–8.0)
Protein, ur: 30 mg/dL — AB
SPECIFIC GRAVITY, URINE: 1.02 (ref 1.005–1.030)

## 2017-10-10 LAB — TROPONIN I
TROPONIN I: 0.08 ng/mL — AB (ref <0.03)
Troponin I: 0.17 ng/mL (ref <0.03)
Troponin I: 0.16 ng/mL (ref <0.03)

## 2017-10-10 LAB — LACTIC ACID, PLASMA
LACTIC ACID, VENOUS: 2.3 mmol/L — AB (ref 0.5–1.9)
Lactic Acid, Venous: 1.4 mmol/L (ref 0.5–1.9)
Lactic Acid, Venous: 1.2 mmol/L (ref 0.5–1.9)

## 2017-10-10 LAB — RAPID URINE DRUG SCREEN, HOSP PERFORMED
Amphetamines: NOT DETECTED
BARBITURATES: NOT DETECTED
Benzodiazepines: POSITIVE — AB
Cocaine: NOT DETECTED
Opiates: POSITIVE — AB
Tetrahydrocannabinol: NOT DETECTED

## 2017-10-10 LAB — PROTIME-INR
INR: 1.3
PROTHROMBIN TIME: 16 s — AB (ref 11.4–15.2)

## 2017-10-10 LAB — APTT: APTT: 44 s — AB (ref 24–36)

## 2017-10-10 LAB — PROCALCITONIN: PROCALCITONIN: 28.95 ng/mL

## 2017-10-10 LAB — CORTISOL: Cortisol, Plasma: 22.7 ug/dL

## 2017-10-10 LAB — ECHOCARDIOGRAM COMPLETE
HEIGHTINCHES: 69 in
WEIGHTICAEL: 2672 [oz_av]

## 2017-10-10 MED ORDER — MORPHINE SULFATE (PF) 4 MG/ML IV SOLN
1.0000 mg | INTRAVENOUS | Status: DC | PRN
  Administered 2017-10-10: 1 mg via INTRAVENOUS
  Filled 2017-10-10: qty 1

## 2017-10-10 MED ORDER — MISOPROSTOL 200 MCG PO TABS
1000.0000 ug | ORAL_TABLET | Freq: Once | ORAL | Status: AC
  Administered 2017-10-10: 1000 ug via VAGINAL
  Filled 2017-10-10: qty 5

## 2017-10-10 MED ORDER — OXYTOCIN 40 UNITS IN LACTATED RINGERS INFUSION - SIMPLE MED
INTRAVENOUS | Status: AC
  Administered 2017-10-10: 11:00:00
  Filled 2017-10-10: qty 1000

## 2017-10-10 MED ORDER — SODIUM CHLORIDE 0.9 % IV BOLUS (SEPSIS)
1000.0000 mL | Freq: Once | INTRAVENOUS | Status: AC
  Administered 2017-10-10: 1000 mL via INTRAVENOUS

## 2017-10-10 MED ORDER — MORPHINE SULFATE (PF) 4 MG/ML IV SOLN
2.0000 mg | INTRAVENOUS | Status: DC | PRN
Start: 2017-10-10 — End: 2017-10-10
  Administered 2017-10-10: 2 mg via INTRAVENOUS

## 2017-10-10 MED ORDER — IBUPROFEN 600 MG PO TABS
600.0000 mg | ORAL_TABLET | Freq: Four times a day (QID) | ORAL | Status: DC | PRN
  Administered 2017-10-10 – 2017-10-12 (×4): 600 mg via ORAL
  Filled 2017-10-10 (×4): qty 1

## 2017-10-10 MED ORDER — SODIUM CHLORIDE 0.9 % IV BOLUS (SEPSIS): 1000.0000 mL | Freq: Once | INTRAVENOUS | Status: AC

## 2017-10-10 MED ORDER — PIPERACILLIN-TAZOBACTAM 3.375 G IVPB
3.3750 g | Freq: Three times a day (TID) | INTRAVENOUS | Status: DC
  Administered 2017-10-11 – 2017-10-12 (×5): 3.375 g via INTRAVENOUS
  Filled 2017-10-10 (×9): qty 50

## 2017-10-10 MED ORDER — PIPERACILLIN-TAZOBACTAM 3.375 G IVPB
3.3750 g | Freq: Once | INTRAVENOUS | Status: AC
Start: 2017-10-10 — End: 2017-10-10
  Administered 2017-10-10: 3.375 g via INTRAVENOUS
  Filled 2017-10-10: qty 50

## 2017-10-10 MED ORDER — OXYCODONE HCL 5 MG PO TABS
5.0000 mg | ORAL_TABLET | ORAL | Status: DC | PRN
  Administered 2017-10-10 (×2): 5 mg via ORAL
  Filled 2017-10-10 (×2): qty 1

## 2017-10-10 MED ORDER — MORPHINE SULFATE (PF) 4 MG/ML IV SOLN: 1.0000 mg | INTRAVENOUS | Status: DC | PRN

## 2017-10-10 MED ORDER — GENTAMICIN SULFATE 40 MG/ML IJ SOLN: 180.0000 mg | Freq: Two times a day (BID) | INTRAVENOUS | Status: DC

## 2017-10-10 MED ORDER — MORPHINE BOLUS VIA INFUSION: 1.0000 mg | INTRAVENOUS | Status: DC | PRN

## 2017-10-10 MED ORDER — SODIUM CHLORIDE 0.9 % IV BOLUS (SEPSIS)
500.0000 mL | Freq: Once | INTRAVENOUS | Status: AC
  Administered 2017-10-10: 500 mL via INTRAVENOUS

## 2017-10-10 MED ORDER — SIMETHICONE 80 MG PO CHEW
80.0000 mg | CHEWABLE_TABLET | Freq: Four times a day (QID) | ORAL | Status: DC | PRN
  Administered 2017-10-10: 80 mg via ORAL
  Filled 2017-10-10: qty 1

## 2017-10-10 NOTE — Progress Notes (Signed)
Patient ID: Jessica Walton, female   DOB: [birth year], 32 y.o.   MRN: [id number] FACULTY PRACTICE ANTEPARTUM(COMPREHENSIVE) NOTE  Jessica Walton is a 32 y.o. [id number] at [redacted]w[redacted]d  who is admitted for previable premature rupture of memebranes.    Fetal presentation is breech. Length of Stay:  4  Days  Date of admission:10/06/2017  Subjective: Patient reports feeling well this morning. She reports minimal vaginal bleeding and her cramping is improved in comparison to yesterday.  Patient reports the fetal movement as decreased . Patient reports uterine contraction  activity as none. Patient reports  vaginal bleeding as less flow than a normal period. Patient describes fluid per vagina as None.  Vitals:  Blood pressure (!) 100/48, pulse (!) 112, temperature 99.8 F (37.7 C), temperature source Oral, resp. rate 20, height 5\' 9"  (1.753 m), weight 167 lb (75.8 kg), last menstrual period 06/01/2017, SpO2 99 %. Vitals:   10/10/17 0520 10/10/17 0800 10/10/17 0830 10/10/17 0915  BP: 107/63 (!) 92/52  (!) 100/48  Pulse: (!) 130 (!) 135  (!) 112  Resp: 20 18  20   Temp: 98.9 F (37.2 C) (!) 102.4 F (39.1 C) (!) 102 F (38.9 C) 99.8 F (37.7 C)  TempSrc: Oral Oral Oral Oral  SpO2: 100% 99%  99%  Weight:      Height:       Physical Examination:  General appearance - alert, well appearing, and in no distress Abdomen - soft, gravid, mildly tender Cervical Exam:  1cm/thick/pos/soft Extremities: extremities normal, atraumatic, no cyanosis or edema Membranes:intact, ruptured  Fetal Monitoring:  Bedside ultrasound demonstrates a fetus in breech presentation without cardiac activity (previously seen yesterday)  Labs:  Results for orders placed or performed during the hospital encounter of 10/06/17 (from the past 24 hour(s))  Type and screen [facility]   Collection Time: 10/09/17  7:49 PM  Result Value Ref Range   ABO/RH(D) A POS    Antibody Screen NEG    Sample Expiration       10/12/2017 Performed at [facility], [address] [place], [place] [address]   CBC with Differential/Platelet   Collection Time: 10/10/17  5:33 AM  Result Value Ref Range   WBC 16.4 (H) 4.0 - 10.5 K/uL   RBC 2.79 (L) 3.87 - 5.11 MIL/uL   Hemoglobin 8.6 (L) 12.0 - 15.0 g/dL   HCT 57.8 (L) 46.9 - 62.9 %   MCV 87.8 78.0 - 100.0 fL   MCH 30.8 26.0 - 34.0 pg   MCHC 35.1 30.0 - 36.0 g/dL   RDW 52.8 41.3 - 24.4 %   Platelets 183 150 - 400 K/uL   Neutrophils Relative % 93 %   Neutro Abs 15.3 (H) 1.7 - 7.7 K/uL   Lymphocytes Relative 5 %   Lymphs Abs 0.8 0.7 - 4.0 K/uL   Monocytes Relative 2 %   Monocytes Absolute 0.3 0.1 - 1.0 K/uL   Eosinophils Relative 0 %   Eosinophils Absolute 0.0 0.0 - 0.7 K/uL   Basophils Relative 0 %   Basophils Absolute 0.0 0.0 - 0.1 K/uL  Comprehensive metabolic panel   Collection Time: 10/10/17  5:33 AM  Result Value Ref Range   Sodium 133 (L) 135 - 145 mmol/L   Potassium 2.8 (L) 3.5 - 5.1 mmol/L   Chloride 103 101 - 111 mmol/L   CO2 21 (L) 22 - 32 mmol/L   Glucose, Bld 114 (H) 65 - 99 mg/dL   BUN 14 6 -  20 mg/dL   Creatinine, Ser 1.611.23 (H) 0.44 - 1.00 mg/dL   Calcium 7.9 (L) 8.9 - 10.3 mg/dL   Total Protein 5.9 (L) 6.5 - 8.1 g/dL   Albumin 2.5 (L) 3.5 - 5.0 g/dL   AST 23 15 - 41 U/L   ALT 15 14 - 54 U/L   Alkaline Phosphatase 68 38 - 126 U/L   Total Bilirubin 0.9 0.3 - 1.2 mg/dL   GFR calc non Af Amer 58 (L) >60 mL/min   GFR calc Af Amer >60 >60 mL/min   Anion gap 9 5 - 15  Lactic acid, plasma   Collection Time: 10/10/17  5:33 AM  Result Value Ref Range   Lactic Acid, Venous 2.3 (HH) 0.5 - 1.9 mmol/L  CBC   Collection Time: 10/10/17  8:50 AM  Result Value Ref Range   WBC 14.0 (H) 4.0 - 10.5 K/uL   RBC 2.44 (L) 3.87 - 5.11 MIL/uL   Hemoglobin 7.6 (L) 12.0 - 15.0 g/dL   HCT 09.621.4 (L) 04.536.0 - 40.946.0 %   MCV 87.7 78.0 - 100.0 fL   MCH 31.1 26.0 - 34.0 pg   MCHC 35.5 30.0 - 36.0 g/dL   RDW 81.115.3 91.411.5 - 78.215.5 %   Platelets 149 (L) 150 -  400 K/uL     Medications:  Scheduled . acetaminophen (TYLENOL) oral liquid 160 mg/5 mL  650 mg Oral Once  . amoxicillin  500 mg Oral Q8H  . azithromycin  500 mg Oral Daily  . docusate sodium  100 mg Oral Daily  . prenatal multivitamin  1 tablet Oral Q1200   I have reviewed the patient's current medications.  ASSESSMENT: [id number] 6071w5d Estimated Date of Delivery: 03/08/18  Patient Active Problem List   Diagnosis Date Noted  . Incompetency, cervical 10/07/2017  . Inevitable abortion 10/06/2017  . Cervical incompetence, antepartum 10/06/2017  . Tetrahydrocannabinol (THC) dependence (HCC)   . Seizures (HCC)   . PTSD (post-traumatic stress disorder)   . Bipolar 1 disorder (HCC)   . Opiate abuse (HCC) 07/25/2016  . Cocaine abuse (HCC) 07/25/2016  . Seizure, convulsion (HCC) 04/30/2015  . History of suicide attempt 07/25/2013  . Supervision of high-risk pregnancy 07/03/2013  . History of prior pregnancy with short cervix, currently pregnant in second trimester 07/03/2013    PLAN: Reviewed and explained laboratory findings with the patient Reviewed the complications associated with sepsis including maternal death Reviewed ultrasound findings demonstrating a fetal demise and advised against medical management Explained that at this point the patient is putting her life at risk and it is best to expedite evacuation of pregnancy Explained induction of labor with high dose cytotec and pain management as needed throughout the process Patient and her mother verbalized understanding and after a few minutes agreed to proceed with induction Sepsis protocol initiated 1000 mcg cytotec placed in the vagina Continue close monitoring Given non viable pregnancy, will change antibiotics to zosyn  Treshon Stannard 10/10/2017,9:26 AM

## 2017-10-10 NOTE — Progress Notes (Signed)
Dr Adrian BlackwaterStinson notified of patient's repeated request to go outside.  Rn to deny patient's request.   RN explained to patient safety concerns due to current sepsis.  Patient persisted the need to go outside.  RN informed pt of the risk of taking medications not given by the hospital staff.  Patient states understanding.

## 2017-10-10 NOTE — Progress Notes (Signed)
Dr Jolayne Pantheronstant notified of Panic Troponin level 0.08 Rn contacted Elink per Dr. Deretha Emoryonstant's request due to recent cocaine use. Elink suggested potential cardiology consult and repeat troponin level in 6-8 hours.

## 2017-10-10 NOTE — Progress Notes (Signed)
WIS Morphine 0.3275ml with Arta Silenceerri Taueber RN

## 2017-10-10 NOTE — Progress Notes (Signed)
  Echocardiogram 2D Echocardiogram has been performed.  Leta JunglingCooper, Meron Bocchino M 10/10/2017, 4:54 PM

## 2017-10-10 NOTE — Progress Notes (Signed)
Dr. Jolayne Pantheronstant notified at this time.  Patient states no relief from Morphine at this time.  Rn awaiting new orders.

## 2017-10-10 NOTE — Progress Notes (Signed)
Pt back on unit.

## 2017-10-10 NOTE — Progress Notes (Signed)
[id number]: 1000mcg Cytotec placed by Dr. Jolayne Pantheronstant   [phone]: Patient D/C from IV for 10-15 wheelchair ride per Dr. Jolayne Pantheronstant.

## 2017-10-10 NOTE — Progress Notes (Signed)
Patient requests hospital disposal of Fetus and Placenta- taken to Pathology.   Patient given comfort care box including fetus footprints and weight per mother's request.

## 2017-10-10 NOTE — Progress Notes (Addendum)
Patient delivered a non viable intact fetus and subsequently an intact placenta. Minimal vaginal bleeding. Patient reports some cramping pain not relieved with 1 mg morphine  Blood pressure (!) 100/48, pulse (!) 112, temperature 99.8 F (37.7 C), temperature source Oral, resp. rate (!) 26, height 5\' 9"  (1.753 m), weight 167 lb (75.8 kg), last menstrual period 06/01/2017, SpO2 99 %. GENERAL: Well-developed, well-nourished female in no acute distress.  ABDOMEN: Soft, non distended, mildly tender EXTREMITIES: No cyanosis, clubbing, or edema, 2+ distal pulses.  A/P 32 yo [id number] s/p delivery of non viable 18 week fetus with sepsis - Continue sepsis protocol - Continue zosyn until afebrile for 48 hours - Continue monitoring vital signs closely - pain management prn - Placenta was inspected and delivered without traction whole and intact

## 2017-10-10 NOTE — Progress Notes (Signed)
Pharmacy Antibiotic Note  Aymee E Eppard is a 32 y.o. female admitted on 3Beryle Quant/15/2019 with leaking of fluid at 18 + weeks gestation.  Pharmacy has been consulted for Gentamicin dosing for choioamnionitis in patient with incompetent cervix. Pt has h/o incompetent cervix with previous pregnancy and 30 week delivery    Plan: Change Gentamicin to 180 mg IV Q12 hr based on patient's current renal function.  Height: 5\' 9"  (175.3 cm) Weight: 167 lb (75.8 kg) IBW/kg (Calculated) : 66.2 kg Dosing Weight: 75.8 kg  Temp (24hrs), Avg:99.5 F (37.5 C), Min:98.3 F (36.8 C), Max:102.4 F (39.1 C)  Recent Labs  Lab 10/06/17 1054 10/10/17 0533  WBC 8.1 16.4*  CREATININE  --  1.23*  LATICACIDVEN  --  2.3*    Estimated Creatinine Clearance: 69.9 mL/min (A) (by C-G formula based on SCr of 1.23 mg/dL (H)).    Allergies  Allergen Reactions  . Benadryl [Diphenhydramine Hcl] Other (See Comments)    Causes seizure activity  . Vicodin [Hydrocodone-Acetaminophen] Rash and Other (See Comments)    And seizures    Antimicrobials this admission: Amoxicillin 500mg  po q8h  3/18 >> Azithromycin 500mg  po daily   3/18 >> Gentamicin 3/18>>   Dose adjustments this admission: Change gentamicin from Q8hr to Q12hr dosing based on renal function.  Thank you for allowing pharmacy to be a part of this patient's care.  Natasha BenceCline, Rheagan Nayak 10/10/2017 8:04 AM

## 2017-10-10 NOTE — Progress Notes (Signed)
0.705ml Morphine WIS with Donzetta Sprungebbie Warren Rn  Medication was given from pharmacy not applicable to waste in pyxis

## 2017-10-10 NOTE — Progress Notes (Signed)
Pt delivered a non viable infant at 1035 in the bedside commode.  Mother and infant assisted to bed.  Dr. Jolayne Pantheronstant called at this time.  Pitocin started per MD orders  Currently awaiting delivery of placenta

## 2017-10-10 NOTE — Progress Notes (Signed)
Dr. Jolayne Pantheronstant at bedside- bedside US performed  No heart rate at this time.   Dr. Jolayne Pantheronstant discussing induction at this time.

## 2017-10-10 NOTE — Progress Notes (Signed)
Patient placed on Tele

## 2017-10-10 NOTE — Progress Notes (Signed)
Dr. Jolayne Pantheronstant called for UDS orders.  Rn to implement.

## 2017-10-10 NOTE — Progress Notes (Signed)
CRITICAL VALUE ALERT  Critical Value:  Troponin 0.17  Date & Time Notied:  10/10/17 1530  Provider Notified: Dr. Jolayne Pantheronstant   Orders Received/Actions taken: No new orders, continue follow up Troponin as previously ordered.

## 2017-10-10 NOTE — Progress Notes (Signed)
CRITICAL VALUE ALERT  Critical Value:  2.3  Date & Time Notied:  3-19 at 6:16  Provider Notified: Dr. Macon LargeAnyanwu  Orders Received/Actions taken: No actions to take at the time due to patients request earlier.

## 2017-10-10 NOTE — Progress Notes (Signed)
Pt requesting a wheelchair ride at this time due to "feeling closed in".  Rn advised patient against this due to current morphine administration and needing to monitor.  Patient agrees.

## 2017-10-11 ENCOUNTER — Other Ambulatory Visit: Payer: Self-pay | Admitting: Obstetrics and Gynecology

## 2017-10-11 LAB — COMPREHENSIVE METABOLIC PANEL
ALBUMIN: 2.1 g/dL — AB (ref 3.5–5.0)
ALT: 14 U/L (ref 14–54)
ANION GAP: 6 (ref 5–15)
AST: 18 U/L (ref 15–41)
Alkaline Phosphatase: 62 U/L (ref 38–126)
BILIRUBIN TOTAL: 0.3 mg/dL (ref 0.3–1.2)
BUN: 12 mg/dL (ref 6–20)
CHLORIDE: 110 mmol/L (ref 101–111)
CO2: 20 mmol/L — AB (ref 22–32)
Calcium: 7.5 mg/dL — ABNORMAL LOW (ref 8.9–10.3)
Creatinine, Ser: 0.92 mg/dL (ref 0.44–1.00)
GFR calc Af Amer: >60 mL/min (ref >60)
GFR calc non Af Amer: >60 mL/min (ref >60)
GLUCOSE: 100 mg/dL — AB (ref 65–99)
POTASSIUM: 3.2 mmol/L — AB (ref 3.5–5.1)
SODIUM: 136 mmol/L (ref 135–145)
TOTAL PROTEIN: 5.2 g/dL — AB (ref 6.5–8.1)

## 2017-10-11 LAB — TROPONIN I
TROPONIN I: 0.16 ng/mL — AB (ref <0.03)
Troponin I: 0.15 ng/mL (ref <0.03)
Troponin I: 0.11 ng/mL (ref <0.03)

## 2017-10-11 LAB — CBC
HCT: 23.6 % — ABNORMAL LOW (ref 36.0–46.0)
Hemoglobin: 8.3 g/dL — ABNORMAL LOW (ref 12.0–15.0)
MCH: 31 pg (ref 26.0–34.0)
MCHC: 35.2 g/dL (ref 30.0–36.0)
MCV: 88.1 fL (ref 78.0–100.0)
PLATELETS: 185 10*3/uL (ref 150–400)
RBC: 2.68 MIL/uL — AB (ref 3.87–5.11)
RDW: 15.9 % — AB (ref 11.5–15.5)
WBC: 19.1 10*3/uL — AB (ref 4.0–10.5)

## 2017-10-11 MED ORDER — NALOXONE HCL 0.4 MG/ML IJ SOLN
0.4000 mg | INTRAMUSCULAR | Status: DC | PRN
  Filled 2017-10-11: qty 1

## 2017-10-11 NOTE — Progress Notes (Signed)
CRITICAL VALUE ALERT  Critical Value:  0.15 Troponin  Date & Time Notied:  1238 10/11/2017  Provider Notified: Constant  Orders Received/Actions taken: New order for Troponin entered by Constant

## 2017-10-11 NOTE — Progress Notes (Signed)
[facility] to comment on elevated troponin levels.  32 yo female with active cocaine abuse s/p septic delivery. No chest pain Troponin level per sepsis protocol 0.15-0.17 with flat trend.  Ecg serial are normal and unchanged from old Ecg. Echo is normal.  Troponin elevation is NOT consistent with acute coronary syndrome and is most consistent with sepsis. No further cardiac work up indicated.  Majesti Gambrell [place] MD, [facility]

## 2017-10-11 NOTE — Progress Notes (Signed)
CRITICAL VALUE ALERT  Critical Value:  Troponin 0.16   Date & Time Notied:  10/11/17 0745   Provider Notified: Dr. Adrian BlackwaterStinson   Orders Received/Actions taken: No new orders

## 2017-10-11 NOTE — Progress Notes (Signed)
Post Partum Day 1 Subjective: Patient reports feeling well with some abdominal cramping. She denies any chest pain, SOB, lightheadedness/dizziness. She denies any fever/chills  Objective: Blood pressure 131/81, pulse 92, temperature 98.1 F (36.7 C), temperature source Oral, resp. rate 11, height 5\' 9"  (1.753 m), weight 167 lb (75.8 kg), last menstrual period 06/01/2017, SpO2 100 %.  Physical Exam:  General: alert, cooperative and no distress Lochia: appropriate Uterine Fundus: firm, non tender DVT Evaluation: No evidence of DVT seen on physical exam.  Recent Labs    10/10/17 0850 10/11/17 0514  HGB 7.6* 8.3*  HCT 21.4* 23.6*    Assessment/Plan: 32 yo [id number] s/p SVD of 18 week fetus secondary to PPROM and chorioamnionitis - Patient afebrile for 24 hours - Continue zosyn - Follow up blood cultures - Troponin levels stable this morning, will repeat later today - Will obtain CMT - Normal ejection fraction on 2D echo - Continue close monitoring   LOS: 5 days   Arlyne Brandes 10/11/2017, 10:38 AM

## 2017-10-11 NOTE — Progress Notes (Signed)
Spiritual Care has been following this patient since her admission to the hospital on Friday.  She declined to see us initially, but we have been checking in with staff regularly.  Today, I went into the room with Jessica Ridingolleen Shaw, LCSW, to offer emotional support.  Pt declined support at this time, but was open to the possibility of support tomorrow.  I will pass this information on to Chaplain Jessica HoraAmanda Davee Walton who will be here tomorrow.  [address] StreetChaplain Jessica CarrelKaty Conor Walton, Bcc Pager, [phone] 4:28 PM    10/11/17 1600  Clinical Encounter Type  Visited With Patient

## 2017-10-11 NOTE — Progress Notes (Signed)
Rn called to patients room by patient's mother.  Patient stated "I forget to breathe when I sleep." RN placed SPO2 monitor on patient and assessed breathing pattern.  Patient stated "What are you going to do about it? I can be having a panic attack, afraid to go to sleep." RN reassured patient her vital signs were stable.  Patient continued to get agitated asking "how are you going to fix this." RN stated "I am assessing your vital signs and breathing pattern to have data to give to the doctor." Patient yelled to the RN "You don't know me, you don't what I can do." RN received this a threat and did not feel comfortable.  RN stated "I will not be talked to like this" and left the room.

## 2017-10-11 NOTE — Progress Notes (Signed)
CSW received consult for substance abuse.  CSW completed chart review and spoke with bedside RN for update prior to attempting to meet with patient.  CSW also spoke with Candie Milehaplain K. Claussen given IUFD and decided that we would attempt to offer support to patient together.  Upon entering the room, patient was lying in bed.  Her only visitor was a child lying on pull out bed next to window.  CSW introduced self and Chaplain as counselors in the hospital who would like to offer her emotional support.  CSW asked if this would be a good time to talk with her and patient politely said, "not right now."  CSW asked if we could return in the morning and patient agreed.  CSW will attempt again sometime tomorrow and informed RN.

## 2017-10-11 NOTE — Consult Note (Addendum)
MFM consult (retroactive note from 10/09/17)  32 yr old [id number] at 2840w4d with PPROM and vaginal bleeding referred by Dr. Jolayne Pantheronstant for consult.  Patient reports she broke her water on 3/15 and now has had intermittent bleeding at times it is heavy. Currently is having some cramping and back pain- no fevers or contractions.   Past OB hx: 2005 TAB; 2007 SAB ; 2008 full term SVD; 2015 preterm labor and patient reports baby suffocated in her vagina (she delivered in the hospital)- was admitted for preterm labor- unclear if fetal or neonatal demise  Patient has not had any prenatal care to date in this pregnancy  See ultrasound report from 3/16- normal limited anatomy; appropriate fetal growth; anhydramnios  I counseled the patient as follows: 1. Previable PPROM: - discussed increased risks of preterm delivery, preterm labor, infection, fetal demise, and nonreassuring fetal status  - discussed there is an increased risk of pulmonary hypoplasia  (approximately 30%- however if develops there is a high rate of mortality), fetal limb and body deformities (usually correct after delivery) with oligohydramnios/anhydramnios prior to 22 weeks  - discussed that we could offer termination- patient reports this is not an option for her - discussed given early gestational age there is not intervention that is currently available outside of antibiotics which patient is currently receiving - after receiving IV abx if patient is stable without out bleeding or signs of labor or infection patient can be managed as an outpatient until the time when patient would want intervention on fetal behalf (would recommend a NICU consult to help patient determine at what gestational age she would want intervention) - if patient is managed as an outpatient recommend she take her temperature 2-4 times a day and present for immediate evaluation if has temp 100 degrees or greater or has cramping, contractions, uterine tenderness, or  vaginal bleeding; she should also be seen by her provider at least once a week to evaluate for infection/labor and for fetal viability  - would recommend readmission at time of fetal viability or when patient would want intervention on fetal behalf and at that time administer latency antibiotics and a course of betamethasone  - would then recommend inpatient management until delivery at 34 weeks or sooner for labor, infection, or nonreassuring fetal status  - discussed if she meets criteria for intrauterine infection would recommend immediate delivery regardless of gestational age as there is significant increase risk to her health - recommend fetal ultrasounds every 4 weeks to evaluate growth  - once viable would recommend at least daily fetal assessment  - do not recommend strict bedrest or trendelenburg as data does not show any benefit  - once patient is admitted she will have decreased activity and therefore recommend SCDs while in bed  2. + UDS: - not addressed as patient's daughter was in the room - recommend social work consult and consider Psychiatry consult - recommend fetal surveillance as above   I spent a total of 40 minutes with the patient of which 100% was in face to face consultation.  Above recommendations discussed with Dr. Jolayne Pantheronstant on 10/09/17  Please call with questions.  Eulis FosterKristen Zoei Amison, MD

## 2017-10-12 ENCOUNTER — Encounter (HOSPITAL_COMMUNITY): Payer: Self-pay

## 2017-10-12 LAB — CBC
HCT: 22.8 % — ABNORMAL LOW (ref 36.0–46.0)
Hemoglobin: 8.1 g/dL — ABNORMAL LOW (ref 12.0–15.0)
MCH: 30.8 pg (ref 26.0–34.0)
MCHC: 35.5 g/dL (ref 30.0–36.0)
MCV: 86.7 fL (ref 78.0–100.0)
Platelets: 214 10*3/uL (ref 150–400)
RBC: 2.63 MIL/uL — ABNORMAL LOW (ref 3.87–5.11)
RDW: 15.6 % — ABNORMAL HIGH (ref 11.5–15.5)
WBC: 14.8 10*3/uL — ABNORMAL HIGH (ref 4.0–10.5)

## 2017-10-12 MED ORDER — CEPHALEXIN 500 MG PO CAPS: 500.0000 mg | ORAL_CAPSULE | Freq: Four times a day (QID) | ORAL | 0 refills | Status: DC

## 2017-10-12 MED ORDER — PRENATAL MULTIVITAMIN CH: 1.0000 | ORAL_TABLET | Freq: Every day | ORAL | 3 refills | Status: DC

## 2017-10-12 NOTE — Progress Notes (Signed)
Discharge instructions discussed with patient.  Paperwork signed.

## 2017-10-12 NOTE — Progress Notes (Signed)
Chaplain visited with LCSW to offer support as patient prepares for discharge. Pt was accompanied by her mother and cousin and states they are a support to her.  Pt did not care to talk much, but did share that she is coping by detaching.  She stated she grieved her previous baby's death and other losses that occurred around that time significantly and she is not going to be as emotionally invested this time.  I reminded her that even though she choses to detach as a way to cope, this may trigger reminders of those losses and still be painful, particularly given this was close to the anniversary of those.  Patient accepted local resources for support for herself and her son and also plans to follow up in her new town as she hopes to relocate to [place] soon.  Chaplain encouraged pt to contact her local hospice to inquire about grief support.  Please page as further needs arise.  Maryanna ShapeAmanda M. Carley Hammedavee Lomax, M.Div. [facility] Chaplain Pager [phone] Office [phone]

## 2017-10-12 NOTE — Discharge Instructions (Signed)
Cervical Insufficiency °Cervical insufficiency is when the cervix is weak and starts to open (dilate) and thin (efface) before the pregnancy is at term and before labor starts. This is also called incompetent cervix. It can happen during the second or third trimester when the fetus starts putting pressure on the cervix. Treatment may reduce the risk of problems for you and your baby. Cervical insufficiency can lead to: °· Loss of the baby (miscarriage). °· Breaking of the sac that holds the baby (amniotic sac). This is also called preterm premature rupture of the membranes,PPROM. °· The baby being born early (preterm birth). ° °What are the causes? °The cause of this condition is not well known. However, it may be caused by abnormalities in the cervix and other factors such as inflammation or infection. °What increases the risk? °This condition is more likely to develop if: °· You have a shorter cervix than normal. °· Your cervix was damaged or injured during a past pregnancy or surgery. °· You were born with a cervical defect. °· You have had a procedure done on the cervix, such as cervical biopsy. °· You have a history of: °? Cervical insufficiency. °? PPROM. °· You have ended several pregnancies through abortion. °· You were exposed to the drug diethylstilbestrol (DES). ° °What are the signs or symptoms? °Symptoms of this condition can vary. Sometimes there no symptoms for this condition, and at other times there are mild symptoms that start between weeks 14 and 20 of pregnancy. The symptoms may last several days or weeks. These symptoms include: °· Light spotting or bleeding from the vagina. °· Pelvic pressure. °· A change in vaginal discharge, such as changes from clear, white, or light yellow to pink or tan. °· Back pain. °· Abdominal pain or cramping. ° °How is this diagnosed? °This condition may be diagnosed based on: °· Your symptoms. °· Your medical history, including: °? Any problems during past  pregnancies, such as miscarriages. °? Any procedures performed on your cervix. °? Any history of cervical insufficiency. ° °During the second trimester, cervical insufficiency may be diagnosed based on: °· An ultrasound done with a probe inserted into your vagina (transvaginal ultrasound). °· A pelvic exam. °· Tests of fluid in the amniotic sac. This is done to rule out infection. ° °How is this treated? °This condition may be managed by: °· Limiting physical activity. °· Limiting activity at home or in the hospital. °· Pelvic rest. This means that there should be no sexual intercourse or placing anything in the vagina. °· A procedure to sew the cervix closed and prevent it from opening too early (cerclage). The stitches (sutures) are removed between weeks 36 and 38 to avoid problems during labor. Cerclage may be recommended if: °? You have a history of miscarriages or preterm births without a known cause. °? You have a short cervix. A short cervix is identified by ultrasound. °? Your cervix has dilated before 24 weeks of pregnancy. ° °Follow these instructions at home: °· Get plenty of rest and lessen activity as told by your health care provider. Ask your health care provider what activities are safe for you. °· If pelvic rest was recommended, you shouldnot have sex, use tampons, douche, or place anything inside your vagina until your health care provider says that this is okay. °· Take over-the-counter and prescription medicines only as told by your health care provider. °· Keep all follow-up visits and prenatal visits as told by your health care provider. This is important. °  Get help right away if:  You have vaginal bleeding, even if it is a small amount or even if it is painless.  You have pain in your abdomen or your lower back.  You have a feeling of increased pressure in your pelvis.  You have vaginal discharge that changes from clear, white, or light yellow to pink or tan.  You have a  fever.  You have severe nausea or vomiting. Summary  Cervical insufficiency is when the cervix is weak and starts to dilate and efface before the pregnancy is at term and before labor starts.  Symptoms of this condition can vary from no symptoms to mild symptoms that start between weeks 14 and 20 of pregnancy. The symptoms may last several days or weeks.  This condition may be managed by limiting physical activity, having pelvic rest, or having cervical cerclage.  If pelvic rest was recommended, you should not have sex, use tampons, use a douche, or place anything inside your vagina until your health care provider says that this is okay. This information is not intended to replace advice given to you by your health care provider. Make sure you discuss any questions you have with your health care provider. Document Released: 07/11/2005 Document Revised: 07/14/2016 Document Reviewed: 07/14/2016 Elsevier Interactive Patient Education  2017 [employer]    Cervical Cerclage Cervical cerclage is a surgical procedure to correct a cervix that opens up and thins out before pregnancy is at term (cervical insufficiency, also called incompetent cervix). This condition can cause labor to start early (prematurely). This procedure involves using stitches to sew the cervix shut during pregnancy. Your surgeon may use ultrasound equipment to help guide the procedure and monitor your baby. Ultrasound equipment uses sound waves to take images of your cervix and uterus. Your surgeon will assess these images on a monitor in the operating room. Tell a health care provider about:  Any allergies you have, especially any allergies related to prescribed medicine, stitches, or anesthetic medicines.  All medicines you are taking, including vitamins, herbs, eye drops, creams, and over-the-counter medicines. Bring a list of all of your medicines to your appointment.  Your medical history, including prior labor  deliveries.  Any problems you or family members have had with anesthetic medicines.  Any blood disorders you have.  Any surgeries you have had, including prior cervical stitching.  Any medical conditions you have.  Whether you are pregnant or may be pregnant. What are the risks? Generally, this is a safe procedure. However, problems may occur, including:  Infection, such as infection of the cervix or amniotic sac.  Vaginal bleeding.  Allergic reactions to medicines.  Damage to other structures or organs, such as tearing (rupture) of membranes or cervical laceration.  Premature contractions including going into early labor and delivery.  Cervical dystocia, which occurs when the cervix is unable to dilate normally during labor.  What happens before the procedure? Staying hydrated Follow instructions from your health care provider about hydration, which may include:  Up to 2 hours before the procedure - you may continue to drink clear liquids, such as water, clear fruit juice, black coffee, and plain tea.  Eating and drinking restrictions Follow instructions from your health care provider about eating and drinking, which may include:  8 hours before the procedure - stop eating heavy meals or foods such as meat, fried foods, or fatty foods.  6 hours before the procedure - stop eating light meals or foods, such as toast or cereal.  6 hours before the procedure - stop drinking milk or drinks that contain milk.  2 hours before the procedure - stop drinking clear liquids.  Medicines  Ask your health care provider about: ? Changing or stopping your regular medicines. This is especially important if you are taking diabetes medicines or blood thinners. ? Taking medicines such as aspirin and ibuprofen. These medicines can thin your blood. Do not take these medicines before your procedure if your health care provider instructs you not to.  You may be given antibiotic medicine to  help prevent infection. General instructions  Do not put on any lotion, deodorant, or perfume.  Remove contact lenses and jewelry.  Ask your health care provider how your surgical site will be marked or identified.  You may have an exam or testing.  You may have a blood or urine sample taken.  Plan to have someone take you home from the hospital or clinic.  If you will be going home right after the procedure, plan to have someone with you for 24 hours. What happens during the procedure?  To reduce your risk of infection: ? Your health care team will wash or sanitize their hands. ? Your skin will be washed with soap.  An IV tube will be inserted into one of your veins.  You may be given one or more of the following: ? A medicine to help you relax (sedative). ? A medicine to numb the area (local anesthetic). ? A medicine to make you fall asleep (general anesthetic). ? A medicine that is injected into your spine to numb the area below and slightly above the injection site (spinal anesthetic). ? A medicine that is injected into an area of your body to numb everything below the injection site (regional anesthetic).  A lubricated instrument (speculum) will be inserted into your vagina. The speculum will be widened to open the walls of your vagina so your surgeon can see your cervix.  Your cervix will be grasped and tightly stitched closed (sutured). To do this, your surgeon will stitch a strong band of thread around your cervix, then the thread will be tightened to hold your cervix shut. The procedure may vary among health care providers and hospitals. What happens after the procedure?  Your blood pressure, heart rate, breathing rate, and blood oxygen level will be monitored until the medicines you were given have worn off. You will be monitored for premature contractions.  You may have light bleeding and mild cramping.  You may have to wear compression stockings. These stockings  help to prevent blood clots and reduce swelling in your legs.  Do not drive for 24 hours if you received a sedative.  You may be put on bed rest.  You may be given medicine to prevent infection.  You may be given an injection of a hormone (progesterone) to prevent your uterus from tightening (contracting). Summary  Cervical cerclage is a surgical procedure that involves using stitches to sew the cervix shut during pregnancy.  Your blood pressure, heart rate, breathing rate, and blood oxygen level will be monitored until the medicines you were given have worn off. You will be monitored for premature contractions.  You may need to be on bed rest after the procedure.  Plan to have someone take you home from the hospital or clinic. This information is not intended to replace advice given to you by your health care provider. Make sure you discuss any questions you have with your health care provider.  Document Released: 06/23/2008 Document Revised: 03/04/2016 Document Reviewed: 02/25/2016 Elsevier Interactive Patient Education  2018 [employer].   Miscarriage A miscarriage is the loss of an unborn baby (fetus) before the 20th week of pregnancy. The cause is often unknown. Follow these instructions at home:  You may need to stay in bed (bed rest), or you may be able to do light activity. Go about activity as told by your doctor.  Have help at home.  Write down how many pads you use each day. Write down how soaked they are.  Do not use tampons. Do not wash out your vagina (douche) or have sex (intercourse) until your doctor approves.  Only take medicine as told by your doctor.  Do not take aspirin.  Keep all doctor visits as told.  If you or your partner have problems with grieving, talk to your doctor. You can also try counseling. Give yourself time to grieve before trying to get pregnant again. Get help right away if:  You have bad cramps or pain in your back or belly  (abdomen).  You have a fever.  You pass large clumps of blood (clots) from your vagina that are walnut-sized or larger. Save the clumps for your doctor to see.  You pass large amounts of tissue from your vagina. Save the tissue for your doctor to see.  You have more bleeding.  You have thick, bad-smelling fluid (discharge) coming from the vagina.  You get lightheaded, weak, or you pass out (faint).  You have chills. This information is not intended to replace advice given to you by your health care provider. Make sure you discuss any questions you have with your health care provider. Document Released: 10/03/2011 Document Revised: 12/17/2015 Document Reviewed: 08/11/2011 Elsevier Interactive Patient Education  2017 [employer].

## 2017-10-12 NOTE — Discharge Summary (Signed)
OB Discharge Summary     Patient Name: Jessica Walton DOB: [birth year] MRN: [id number]  Date of admission: 10/06/2017 Delivering MD: Tacy DuraPUGH, RACHEL A   Date of discharge: 10/12/2017  Admitting diagnosis: LEAKING FLUIDS Intrauterine pregnancy: 5485w5d     Secondary diagnosis:  Principal Problem:   Inevitable abortion Active Problems:   Supervision of high-risk pregnancy   History of prior pregnancy with short cervix, currently pregnant in second trimester   Cervical incompetence, antepartum   Tetrahydrocannabinol (THC) dependence (HCC)   Seizures (HCC)   PTSD (post-traumatic stress disorder)   Opiate abuse (HCC)   History of suicide attempt   Cocaine abuse (HCC)   Bipolar 1 disorder (HCC)   Incompetency, cervical  Additional problems: chorioamnionitis     Discharge diagnosis: preterm previable pregnancy delivered with chorioamionitis, incompetent cervix                                                                                                Post partum procedures: IV antibiotics  Hospital course:  32 yo [id number] admitted at 18 weeks with bulging membranes. Patient with a history of cervical incompetence. She experienced rupture of membrane on HD#1. Patient was extensively counseled and desired expectant management. On HD#3, she reported heavy vaginal bleeding associated with abdominal cramping pain. She developed a temperature and diagnosed with chorioamnionitis. For over 12 hours, patient declined medical management to expedite delivery. On HD#4, she agreed to induction of labor with cytotec and delivered a non viable fetus and intact placenta. She was started on zosyn. Sepsis protocol was activated. She remained afebrile for over 48 hours. Blood cultures were negative. Patient was found stable for discharge. Patient discharged home on 7 day course of keflex. Patient with a history of substance abuse with drug of choice being opiates. She expressed interest in outpatient  treatment but also plans to relocate to [place] next week. Discussed need for follow up in 2 weeks in our office to ensure full recovery. Discussed need for early cerclage placement to avoid another loss due to incompetent cervix.    Physical exam  Vitals:   10/12/17 0000 10/12/17 0457 10/12/17 0744 10/12/17 1211  BP: (!) 106/55 120/74 129/83 119/75  Pulse: 100 86 79 79  Resp: 18 18 18 18   Temp: 98.6 F (37 C) 98.8 F (37.1 C) 98 F (36.7 C) 98.1 F (36.7 C)  TempSrc: Oral Oral Oral Oral  SpO2: 100% 100% 98% 96%  Weight:      Height:       General: alert, cooperative and no distress Lochia: appropriate Uterine Fundus: firm, non tender DVT Evaluation: No evidence of DVT seen on physical exam. Labs: Lab Results  Component Value Date   WBC 14.8 (H) 10/12/2017   HGB 8.1 (L) 10/12/2017   HCT 22.8 (L) 10/12/2017   MCV 86.7 10/12/2017   PLT 214 10/12/2017   CMP Latest Ref Rng & Units 10/11/2017  Glucose 65 - 99 mg/dL 621(H100(H)  BUN 6 - 20 mg/dL 12  Creatinine 0.860.44 - 5.781.00 mg/dL 4.690.92  Sodium 629135 - 528145 mmol/L 136  Potassium 3.5 -  5.1 mmol/L 3.2(L)  Chloride 101 - 111 mmol/L 110  CO2 22 - 32 mmol/L 20(L)  Calcium 8.9 - 10.3 mg/dL 7.5(L)  Total Protein 6.5 - 8.1 g/dL 5.2(L)  Total Bilirubin 0.3 - 1.2 mg/dL 0.3  Alkaline Phos 38 - 126 U/L 62  AST 15 - 41 U/L 18  ALT 14 - 54 U/L 14    Discharge instruction: per After Visit Summary and "Baby and Me Booklet".  After visit meds:  Allergies as of 10/12/2017      Reactions   Benadryl [diphenhydramine Hcl] Other (See Comments)   Causes seizure activity   Vicodin [hydrocodone-acetaminophen] Rash, Other (See Comments)   And seizures       Diet: routine diet  Activity: Advance as tolerated. Pelvic rest for 6 weeks.   Outpatient follow up: Will schedule follow up appointment with Ob-gyn office in 2 weeks and refer to internal medicine suboxone clinic in the event that the patient will not relocate to [place] Follow up Appt:No  future appointments. Follow up Visit:No follow-ups on file.  Postpartum contraception: Condoms and Undecided  Newborn Data: Female fetus delivered non living Birth Weight: 8 oz (227 g) APGAR: ,   Newborn Delivery   Birth date/time:  10/10/2017 10:35:00 Delivery type:  Vaginal, Spontaneous        10/12/2017 Catalina Antigua, MD

## 2017-10-12 NOTE — Progress Notes (Addendum)
CSW and Chaplain/A. Davee Lomax returned to patient's room to again offer emotional support and resources given recent loss, hx of loss, current substance use, and mental health concerns.  Patient informed us that she does not need to talk because she "did not attach myself to this baby."  She states she has been through loss before and explained that "I buried two sons (30 week IUFD and her step-son) and my husband in the same month."  CSW suggested that this may be all the more reason to accept support.  Without agreeing, patient began opening up about her past and current situation.  She did not want her guests to leave the room.  She introduced them as her mother, son and cousin.  Cousin appeared to be around the same age as patient.  Her son (10 according to her chart) was asleep on the pullout bed.  CSW did not bring up substance use or mental health concerns given her company in the room.  Patient's mother informed CSW and Chaplain that patient's son went to [facility] for a long time after the death of his father and baby brother.  Chaplain commented that although he has been there in the past, it is okay to return.  We discussed how a new loss can trigger feelings of a previous loss and the importance of addressing these feelings.  CSW commented about the importance of identifying positive coping mechanisms, as it is not uncommon to utilize unhealthy coping mechanisms to try to numb the pain of grief and loss.  Patient states she is open to grief and loss resources, however, she states she is planning to move to [place] in a week.  When asked where in [place], she states she does not know.  She states her friend is coming to get her.  She thinks her friend might live in [place].  CSW asked her to speak with her friend so that we could get her specific resources for the area in which she is moving.  She agreed.  Patient was given some generic resources regarding coping with grief, as well as specific resources  for Hospice and Palliative Care/[facility] in [place], should the move to [place] not happen or be postponed.  Patient and her family seemed appreciative.  CSW moved closer to patient while Chaplain continued to speak with her mother.  CSW asked patient if there were any additional resources she felt she needed in addition to grief and loss.  She declined.  CSW asked her to let her RN know if she would like to talk with [place] further prior to discharge.  She agreed.  Patient was polite and pleasant throughout conversation. Upon leaving the room, patient called for her RN.  RN soon came out of the room and told CSW that patient asked what we would do if she thinks she is withdrawing.  CSW asked RN to return to the room and see if she felt comfortable talking to CSW about this.  Patient agreed, so CSW returned to the room.  CSW commended patient for bringing this difficult topic up and reaching out for help.  CSW again commented on how typical it is for substance use to accompany grief and loss, but identified it as a maladaptive way to cope with emotions.  Patient agreed that she was trying to make the pain go away.  CSW asked her what drugs she is using.  Patient replied, "opiates."  CSW asked if this is specifically heroin and she  and her cousin laughed like this was a ridiculous question.  CSW calmly explained that CSW needed to know what kind of drugs we are talking about.  Patient adamantly denies heroin use and states she has a prescription for opiates, but also buys additional pain pills.  She reports use of Xanax, and also states she has a Rx for this as well.  CSW commented on the concern that she is taking these two types of medication, possibly not as prescribed, together on a regular basis.  She replied, "my doctor gives them to me."  CSW asked about other substances, specifically cocaine.  Patient again seemed offended and strongly denies.  CSW notes that her UDS was positive for cocaine, but did not  confront patient.  CSW asked about marijuana and she confirmed use.  She also reports alcohol abuse.  She denies all other substances.  CSW first talked about mental health and suggests an evaluation to address medication and counseling.  Patient states she has tried Prozac and Zoloft in the past, but they made her sick.  CSW asked about her diagnosis and she reports that she has Bipolar.  CSW questions use of these medications for a Bipolar dx any way.  Patient states she does not want to [facility] the [facility], but states she is willing to seek treatment [place].  CSW discussed various levels of treatment with patient.  She states she is not interested in detox or inpatient treatment of any kind.  CSW asked if patient is interested in starting a Methadone or Suboxone treatment program.  She states her doctor spoke to her about this a few months ago, but feels there was no follow up.  She states she is interested, but needs a program who accepts Medicaid, as she is unable to go somewhere that is self-pay.  CSW provided patient with printed information regarding Methadone vs Buprenorphine and information about Alcohol and Drug Services if she remains in [place] and wants to start treatment immediately.  CSW explained that we will not initiate Methadone or Buprenorphine in the hospital today and suggests that she call the treatment center of her choice as soon as possible to thoroughly discuss her options before making a decision as to what is best for her.  CSW gave her the website for SAMHSA (Substance Abuse and Mental Health Services Administration) so that she can find resources for mental health and substance abuse services any where in the country.  Patient was accepting and agreeable.  Patient reports that she feels she is starting to withdrawal and that she has a headache.  She asked if CSW would ask RN if she could get something for this.  CSW relayed this to RN.   CSW feels it is  necessary to make a Child Protective Services report given the fact that the 31 year old has not been to school for at least the past 5 days while patient has been in the hospital and that patient has a UDS positive for cocaine, opiates, and marijuana.  CSW does not know 32 year old's name, but patient reported that he is enrolled at [employer].  This will not delay patient's discharge.  CSW does not identify any further needs for intervention at this time.

## 2017-10-15 LAB — CULTURE, BLOOD (ROUTINE X 2)
Culture: NO GROWTH
Culture: NO GROWTH
SPECIAL REQUESTS: ADEQUATE
Special Requests: ADEQUATE

## 2017-10-26 ENCOUNTER — Ambulatory Visit: Payer: Medicaid Other | Admitting: Certified Nurse Midwife

## 2017-10-26 ENCOUNTER — Institutional Professional Consult (permissible substitution): Payer: Medicaid Other

## 2017-10-26 NOTE — BH Specialist Note (Deleted)
Integrated Behavioral Health Initial Visit  MRN: [id number] Name: Beryle Quantrlie E Caprara  Number of Integrated Behavioral Health Clinician visits:: 1/6 Session Start time: ***  Session End time: *** Total time: {IBH Total Time:21014050}  Type of Service: Integrated Behavioral Health- Individual/Family Interpretor:No. Interpretor Name and Language: n/a   Warm Hand Off Completed.       SUBJECTIVE: Beryle Quantrlie E Kistner is a 32 y.o. female accompanied by {CHL AMB ACCOMPANIED BM:[id number]}BY:[phone]} Patient was referred by *** for ***. Patient reports the following symptoms/concerns: *** Duration of problem: ***; Severity of problem: {Mild/Moderate/Severe:20260}  OBJECTIVE: Mood: {BHH MOOD:22306} and Affect: {BHH AFFECT:22307} Risk of harm to self or others: {CHL AMB BH Suicide Current Mental Status:21022748}  LIFE CONTEXT: Family and Social: *** School/Work: *** Self-Care: *** Life Changes: ***  GOALS ADDRESSED: Patient will: 1. Reduce symptoms of: {IBH Symptoms:21014056} 2. Increase knowledge and/or ability of: {IBH Patient Tools:21014057}  3. Demonstrate ability to: {IBH Goals:21014053}  INTERVENTIONS: Interventions utilized: {IBH Interventions:21014054}  Standardized Assessments completed: {IBH Screening Tools:21014051}  ASSESSMENT: Patient currently experiencing ***.   Patient may benefit from ***.  PLAN: 1. Follow up with behavioral health clinician on : *** 2. Behavioral recommendations: *** 3. Referral(s): {IBH Referrals:21014055} 4. "From scale of 1-10, how likely are you to follow plan?": ***  Valetta CloseJamie C Dontee Jaso, LCSW

## 2018-08-27 DIAGNOSIS — F17210 Nicotine dependence, cigarettes, uncomplicated: Secondary | ICD-10-CM | POA: Insufficient documentation

## 2018-08-27 DIAGNOSIS — Y999 Unspecified external cause status: Secondary | ICD-10-CM | POA: Insufficient documentation

## 2018-08-27 DIAGNOSIS — Y929 Unspecified place or not applicable: Secondary | ICD-10-CM | POA: Diagnosis not present

## 2018-08-27 DIAGNOSIS — R51 Headache: Secondary | ICD-10-CM | POA: Diagnosis not present

## 2018-08-27 DIAGNOSIS — Y9389 Activity, other specified: Secondary | ICD-10-CM | POA: Diagnosis not present

## 2018-08-27 DIAGNOSIS — S6992XA Unspecified injury of left wrist, hand and finger(s), initial encounter: Secondary | ICD-10-CM | POA: Diagnosis not present

## 2018-08-27 DIAGNOSIS — S199XXA Unspecified injury of neck, initial encounter: Secondary | ICD-10-CM | POA: Diagnosis present

## 2018-08-27 DIAGNOSIS — R0789 Other chest pain: Secondary | ICD-10-CM | POA: Diagnosis not present

## 2018-08-27 DIAGNOSIS — S161XXA Strain of muscle, fascia and tendon at neck level, initial encounter: Secondary | ICD-10-CM | POA: Insufficient documentation

## 2018-08-27 DIAGNOSIS — J45909 Unspecified asthma, uncomplicated: Secondary | ICD-10-CM | POA: Diagnosis not present

## 2018-08-27 DIAGNOSIS — I10 Essential (primary) hypertension: Secondary | ICD-10-CM | POA: Insufficient documentation

## 2018-08-28 ENCOUNTER — Emergency Department (HOSPITAL_COMMUNITY)
Admission: EM | Admit: 2018-08-28 | Discharge: 2018-08-28 | Disposition: A | Discharge status: Home / Self Care | Payer: Medicaid Other | Attending: Emergency Medicine | Admitting: Emergency Medicine

## 2018-08-28 ENCOUNTER — Encounter (HOSPITAL_COMMUNITY): Payer: Self-pay | Admitting: Emergency Medicine

## 2018-08-28 ENCOUNTER — Other Ambulatory Visit: Payer: Self-pay

## 2018-08-28 ENCOUNTER — Emergency Department (HOSPITAL_COMMUNITY): Payer: Medicaid Other

## 2018-08-28 DIAGNOSIS — S161XXA Strain of muscle, fascia and tendon at neck level, initial encounter: Secondary | ICD-10-CM

## 2018-08-28 MED ORDER — PROCHLORPERAZINE MALEATE 10 MG PO TABS
10.0000 mg | ORAL_TABLET | Freq: Once | ORAL | Status: AC
  Administered 2018-08-28: 10 mg via ORAL
  Filled 2018-08-28: qty 1

## 2018-08-28 MED ORDER — IBUPROFEN 600 MG PO TABS: 600.0000 mg | ORAL_TABLET | Freq: Three times a day (TID) | ORAL | 0 refills | Status: DC | PRN

## 2018-08-28 MED ORDER — DIPHENHYDRAMINE HCL 25 MG PO CAPS
25.0000 mg | ORAL_CAPSULE | Freq: Once | ORAL | Status: DC
  Filled 2018-08-28: qty 1

## 2018-08-28 MED ORDER — OXYCODONE-ACETAMINOPHEN 5-325 MG PO TABS
1.0000 | ORAL_TABLET | Freq: Once | ORAL | Status: AC
  Administered 2018-08-28: 1 via ORAL
  Filled 2018-08-28: qty 1

## 2018-08-28 MED ORDER — METHOCARBAMOL 500 MG PO TABS: 500.0000 mg | ORAL_TABLET | Freq: Three times a day (TID) | ORAL | 0 refills | Status: AC

## 2018-08-28 NOTE — ED Notes (Signed)
Bed: WA08 Expected date:  Expected time:  Means of arrival:  Comments: 
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
## 2018-08-28 NOTE — ED Notes (Signed)
Bed: WA07 Expected date:  Expected time:  Means of arrival:  Comments: 
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
## 2018-09-18 ENCOUNTER — Encounter (HOSPITAL_COMMUNITY): Payer: Self-pay | Admitting: Emergency Medicine

## 2018-09-18 ENCOUNTER — Other Ambulatory Visit: Payer: Self-pay

## 2018-09-18 ENCOUNTER — Emergency Department (HOSPITAL_COMMUNITY): Payer: Medicaid Other

## 2018-09-18 ENCOUNTER — Emergency Department (HOSPITAL_COMMUNITY)
Admission: EM | Admit: 2018-09-18 | Discharge: 2018-09-19 | Disposition: A | Discharge status: Home / Self Care | Payer: Medicaid Other | Attending: Emergency Medicine | Admitting: Emergency Medicine

## 2018-09-18 DIAGNOSIS — J111 Influenza due to unidentified influenza virus with other respiratory manifestations: Secondary | ICD-10-CM

## 2018-09-18 DIAGNOSIS — Z79899 Other long term (current) drug therapy: Secondary | ICD-10-CM | POA: Diagnosis not present

## 2018-09-18 DIAGNOSIS — F17210 Nicotine dependence, cigarettes, uncomplicated: Secondary | ICD-10-CM | POA: Diagnosis not present

## 2018-09-18 DIAGNOSIS — R05 Cough: Secondary | ICD-10-CM | POA: Insufficient documentation

## 2018-09-18 DIAGNOSIS — J45909 Unspecified asthma, uncomplicated: Secondary | ICD-10-CM | POA: Insufficient documentation

## 2018-09-18 DIAGNOSIS — R509 Fever, unspecified: Secondary | ICD-10-CM | POA: Diagnosis present

## 2018-09-18 DIAGNOSIS — R69 Illness, unspecified: Secondary | ICD-10-CM

## 2018-09-18 MED ORDER — DM-GUAIFENESIN ER 30-600 MG PO TB12
1.0000 | ORAL_TABLET | Freq: Two times a day (BID) | ORAL | Status: DC
  Administered 2018-09-19: 1 via ORAL
  Filled 2018-09-18: qty 1

## 2018-09-18 MED ORDER — IPRATROPIUM BROMIDE 0.02 % IN SOLN
0.5000 mg | Freq: Once | RESPIRATORY_TRACT | Status: AC
  Administered 2018-09-19: 0.5 mg via RESPIRATORY_TRACT
  Filled 2018-09-18: qty 2.5

## 2018-09-18 MED ORDER — ALBUTEROL SULFATE (2.5 MG/3ML) 0.083% IN NEBU
5.0000 mg | INHALATION_SOLUTION | Freq: Once | RESPIRATORY_TRACT | Status: AC
  Administered 2018-09-19: 5 mg via RESPIRATORY_TRACT
  Filled 2018-09-18: qty 6

## 2018-09-18 NOTE — ED Triage Notes (Signed)
Per EMS, patient from home, c/o non productive cough x3 days. Reports using OTC cold and flu meds.

## 2018-09-19 MED ORDER — AZITHROMYCIN 250 MG PO TABS: ORAL_TABLET | ORAL | 0 refills | Status: DC

## 2018-09-19 MED ORDER — OSELTAMIVIR PHOSPHATE 75 MG PO CAPS: 75.0000 mg | ORAL_CAPSULE | Freq: Two times a day (BID) | ORAL | 0 refills | Status: DC

## 2018-09-19 MED ORDER — ONDANSETRON HCL 4 MG PO TABS: 4.0000 mg | ORAL_TABLET | Freq: Three times a day (TID) | ORAL | 0 refills | Status: DC | PRN

## 2018-09-19 NOTE — ED Provider Notes (Signed)
[facility]-EMERGENCY DEPT Provider Note   CSN: [id number] Arrival date & time: [id number]  Time seen 11:41 PM  History   Chief Complaint No chief complaint on file.   HPI Jessica Walton is a 33 y.o. female.     HPI patient states February 23 she woke up with fever and cough that is dry.  She initially had a sore throat but that is better now.  She states her chest feels tight and she is dizzy.  She has had some mild rhinorrhea but no sneezing.  She had diarrhea yesterday 5-6 times but none today.  She denies nausea or vomiting.  She complains of a lot of body aches.  She states she has been wheezing.  Several other family members have also been sick.  She states she has a history of reactive airway disease when she was a child and has not had to use an inhaler for many years.  She never had to be admitted to the hospital.  Patient did not get the flu shot this season.  PCP Patient, No Pcp Per   Past Medical History:  Diagnosis Date  . Asthma    No inhaler use x 1 yr (2013)  . Bipolar 1 disorder (HCC)   . Cocaine abuse (HCC) 2018  . Headache   . History of suicide attempt 2015  . Hypertension   . Lordosis   . Opiate abuse (HCC) 2018  . PTSD (post-traumatic stress disorder)   . Seizures (HCC)   . Tetrahydrocannabinol (THC) dependence [facility])     Patient Active Problem List   Diagnosis Date Noted  . Incompetency, cervical 10/07/2017  . Inevitable abortion 10/06/2017  . Cervical incompetence, antepartum 10/06/2017  . Tetrahydrocannabinol (THC) dependence (HCC)   . Seizures (HCC)   . PTSD (post-traumatic stress disorder)   . Bipolar 1 disorder (HCC)   . Opiate abuse (HCC) 07/25/2016  . Cocaine abuse (HCC) 07/25/2016  . Seizure, convulsion (HCC) 04/30/2015  . History of suicide attempt 07/25/2013  . Supervision of high-risk pregnancy 07/03/2013  . History of prior pregnancy with short cervix, currently pregnant in second trimester 07/03/2013     Past Surgical History:  Procedure Laterality Date  . EYE SURGERY    . WISDOM TOOTH EXTRACTION     x4     OB History    Gravida  5   Para  3   Term  1   Preterm  1   AB  2   Living  1     SAB  1   TAB  1   Ectopic  0   Multiple  0   Live Births  1            Home Medications    Prior to Admission medications   Medication Sig Start Date End Date Taking? Authorizing Provider  guaifenesin (ROBITUSSIN) 100 MG/5ML syrup Take 200 mg by mouth 3 (three) times daily as needed for cough.   Yes [provider]  ibuprofen (ADVIL,MOTRIN) 600 MG tablet Take 1 tablet (600 mg total) by mouth every 8 (eight) hours as needed for moderate pain. 08/28/18  Yes Shaune Pollack, MD  azithromycin (ZITHROMAX Z-PAK) 250 MG tablet Take 2 po the first day then once a day for the next 4 days. 09/19/18   Devoria Albe, MD  cephALEXin (KEFLEX) 500 MG capsule Take 1 capsule (500 mg total) by mouth 4 (four) times daily. Patient not taking: Reported on 08/28/2018 10/12/17  Constant, Peggy, MD  ondansetron (ZOFRAN) 4 MG tablet Take 1 tablet (4 mg total) by mouth every 8 (eight) hours as needed for nausea or vomiting. 09/19/18   Devoria Albe, MD  oseltamivir (TAMIFLU) 75 MG capsule Take 1 capsule (75 mg total) by mouth every 12 (twelve) hours. 09/19/18   Devoria Albe, MD  Prenatal Vit-Fe Fumarate-FA (PRENATAL MULTIVITAMIN) TABS tablet Take 1 tablet by mouth daily at 12 noon. Patient not taking: Reported on 08/28/2018 10/13/17   Constant, Peggy, MD    Family History Family History  Problem Relation Age of Onset  . Hypertension Maternal Grandmother   . Diabetes Maternal Grandmother   . Seizures Neg Hx     Social History Social History   Tobacco Use  . Smoking status: Current Some Day Smoker    Packs/day: 0.50    Years: 10.00    Pack years: 5.00    Types: Cigarettes  . Smokeless tobacco: Never Used  Substance Use Topics  . Alcohol use: Yes    Alcohol/week: 0.0 standard drinks     Comment: occasional  . Drug use: No     Allergies   Benadryl [diphenhydramine hcl] and Vicodin [hydrocodone-acetaminophen]   Review of Systems Review of Systems  All other systems reviewed and are negative.    Physical Exam Updated Vital Signs BP (!) 129/92 (BP Location: Right Arm)   Pulse (!) 105   Temp 99 F (37.2 C) (Oral)   Resp 16   Ht 5\' 9"  (1.753 m)   Wt 68 kg   LMP 08/28/2018   SpO2 100%   BMI 22.15 kg/m   Vital signs normal except tachycardia   Physical Exam Vitals signs and nursing note reviewed.  Constitutional:      General: She is not in acute distress.    Appearance: Normal appearance. She is well-developed. She is not ill-appearing or toxic-appearing.  HENT:     Head: Normocephalic and atraumatic.     Right Ear: External ear normal.     Left Ear: External ear normal.     Nose: Nose normal. No mucosal edema or rhinorrhea.     Mouth/Throat:     Dentition: No dental abscesses.     Pharynx: No uvula swelling.  Eyes:     Conjunctiva/sclera: Conjunctivae normal.     Pupils: Pupils are equal, round, and reactive to light.  Neck:     Musculoskeletal: Full passive range of motion without pain, normal range of motion and neck supple.  Cardiovascular:     Rate and Rhythm: Normal rate and regular rhythm.     Heart sounds: Normal heart sounds. No murmur. No friction rub. No gallop.   Pulmonary:     Effort: Pulmonary effort is normal. No respiratory distress.     Breath sounds: Decreased breath sounds present. No wheezing, rhonchi or rales.  Chest:     Chest wall: No tenderness or crepitus.  Abdominal:     General: Bowel sounds are normal. There is no distension.     Palpations: Abdomen is soft.     Tenderness: There is no abdominal tenderness. There is no guarding or rebound.  Musculoskeletal: Normal range of motion.        General: No tenderness.     Comments: Moves all extremities well.   Skin:    General: Skin is warm and dry.     Coloration:  Skin is not pale.     Findings: No erythema or rash.  Neurological:     Mental  Status: She is alert and oriented to person, place, and time.     Cranial Nerves: No cranial nerve deficit.  Psychiatric:        Mood and Affect: Mood is not anxious.        Speech: Speech normal.        Behavior: Behavior normal.      ED Treatments / Results  Labs (all labs ordered are listed, but only abnormal results are displayed) Labs Reviewed - No data to display  EKG None  Radiology Dg Chest 2 View  Result Date: 09/19/2018 CLINICAL DATA:  Cough, fever, congestion and body aches for a week. EXAM: CHEST - 2 VIEW COMPARISON:  Chest radiograph August 28, 2018 FINDINGS: Cardiomediastinal silhouette is normal. No pleural effusions or focal consolidations. Trachea projects midline and there is no pneumothorax. Soft tissue planes and included osseous structures are non-suspicious. IMPRESSION: Normal chest radiograph. Electronically Signed   By: Awilda Metro M.D.   On: 09/19/2018 00:05    Procedures Procedures (including critical care time)  Medications Ordered in ED Medications  dextromethorphan-guaiFENesin (MUCINEX DM) 30-600 MG per 12 hr tablet 1 tablet (1 tablet Oral Given 09/19/18 0012)  albuterol (PROVENTIL) (2.5 MG/3ML) 0.083% nebulizer solution 5 mg (5 mg Nebulization Given 09/19/18 0012)  ipratropium (ATROVENT) nebulizer solution 0.5 mg (0.5 mg Nebulization Given 09/19/18 0012)     Initial Impression / Assessment and Plan / ED Course  I have reviewed the triage vital signs and the nursing notes.  Pertinent labs & imaging results that were available during my care of the patient were reviewed by me and considered in my medical decision making (see chart for details).       We discussed her chest x-ray which was normal.  Patient is coughing a lot.  She was given Mucinex DM for her cough and we will try albuterol nebulizer to see if that helps with her feeling of her chest being tight  and having difficulty breathing.  Recheck at 12:40 AM patient states the nebulizer did not improve her breathing.  Therefore she was not discharged home with an inhaler.  We discussed she probably has the flu, I will prescribe Tamiflu however she is out of the 48-hour window.  Since she smokes she will also be given antibiotic.  She was given conservative treatment such as ibuprofen and Motrin for fever and body aches and drinking fluids.  Final Clinical Impressions(s) / ED Diagnoses   Final diagnoses:  Influenza-like illness    ED Discharge Orders         Ordered    oseltamivir (TAMIFLU) 75 MG capsule  Every 12 hours     09/19/18 0048    azithromycin (ZITHROMAX Z-PAK) 250 MG tablet     09/19/18 0048    ondansetron (ZOFRAN) 4 MG tablet  Every 8 hours PRN     09/19/18 0048        OTC ibuprofen and acetaminophen  Plan discharge  Devoria Albe, MD, Concha Pyo, MD 09/19/18 [phone]

## 2018-09-19 NOTE — Discharge Instructions (Addendum)
Drink more fluids, you will need more fluids if you have fever.  Take ibuprofen 600 mg plus acetaminophen 1000 mg every 6 hours as needed for fevers or body aches.  Take Mucinex DM over-the-counter for cough.  Use the Zofran if you have nausea or vomiting.  You can start the Tamiflu however if you have worsening nausea or vomiting it can be from that so then stop it.  Take antibiotic until gone.  Recheck if you seem worse.
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
## 2021-03-09 ENCOUNTER — Emergency Department (HOSPITAL_COMMUNITY)
Admission: EM | Admit: 2021-03-09 | Discharge: 2021-03-09 | Disposition: A | Discharge status: Home / Self Care | Payer: Medicaid Other | Attending: Emergency Medicine | Admitting: Emergency Medicine

## 2021-03-09 ENCOUNTER — Encounter (HOSPITAL_COMMUNITY): Payer: Self-pay

## 2021-03-09 DIAGNOSIS — I10 Essential (primary) hypertension: Secondary | ICD-10-CM | POA: Insufficient documentation

## 2021-03-09 DIAGNOSIS — J45909 Unspecified asthma, uncomplicated: Secondary | ICD-10-CM | POA: Diagnosis not present

## 2021-03-09 DIAGNOSIS — F1910 Other psychoactive substance abuse, uncomplicated: Secondary | ICD-10-CM | POA: Insufficient documentation

## 2021-03-09 DIAGNOSIS — F17210 Nicotine dependence, cigarettes, uncomplicated: Secondary | ICD-10-CM | POA: Diagnosis not present

## 2021-03-09 DIAGNOSIS — R4020 Unspecified coma: Secondary | ICD-10-CM | POA: Diagnosis present

## 2021-03-09 LAB — CBG MONITORING, ED: Glucose-Capillary: 83 mg/dL (ref 70–99)

## 2021-03-09 LAB — COMPREHENSIVE METABOLIC PANEL
ALT: 11 U/L (ref 0–44)
AST: 19 U/L (ref 15–41)
Albumin: 3.8 g/dL (ref 3.5–5.0)
Alkaline Phosphatase: 55 U/L (ref 38–126)
Anion gap: 9 (ref 5–15)
BUN: 16 mg/dL (ref 6–20)
CO2: 25 mmol/L (ref 22–32)
Calcium: 9.2 mg/dL (ref 8.9–10.3)
Chloride: 103 mmol/L (ref 98–111)
Creatinine, Ser: 1.1 mg/dL — ABNORMAL HIGH (ref 0.44–1.00)
GFR, Estimated: >60 mL/min (ref >60)
Glucose, Bld: 88 mg/dL (ref 70–99)
Potassium: 3.4 mmol/L — ABNORMAL LOW (ref 3.5–5.1)
Sodium: 137 mmol/L (ref 135–145)
Total Bilirubin: 0.2 mg/dL — ABNORMAL LOW (ref 0.3–1.2)
Total Protein: 6.7 g/dL (ref 6.5–8.1)

## 2021-03-09 LAB — CBC WITH DIFFERENTIAL/PLATELET
Abs Immature Granulocytes: 0.03 10*3/uL (ref 0.00–0.07)
Basophils Absolute: 0 10*3/uL (ref 0.0–0.1)
Basophils Relative: 0 %
Eosinophils Absolute: 0.3 10*3/uL (ref 0.0–0.5)
Eosinophils Relative: 4 %
HCT: 38.6 % (ref 36.0–46.0)
Hemoglobin: 12.4 g/dL (ref 12.0–15.0)
Immature Granulocytes: 0 %
Lymphocytes Relative: 40 %
Lymphs Abs: 2.9 10*3/uL (ref 0.7–4.0)
MCH: 29 pg (ref 26.0–34.0)
MCHC: 32.1 g/dL (ref 30.0–36.0)
MCV: 90.4 fL (ref 80.0–100.0)
Monocytes Absolute: 0.5 10*3/uL (ref 0.1–1.0)
Monocytes Relative: 7 %
Neutro Abs: 3.5 10*3/uL (ref 1.7–7.7)
Neutrophils Relative %: 49 %
Platelets: 326 10*3/uL (ref 150–400)
RBC: 4.27 MIL/uL (ref 3.87–5.11)
RDW: 14.8 % (ref 11.5–15.5)
WBC: 7.2 10*3/uL (ref 4.0–10.5)
nRBC: 0 % (ref 0.0–0.2)

## 2021-03-09 LAB — ACETAMINOPHEN LEVEL: Acetaminophen (Tylenol), Serum: <10 ug/mL — ABNORMAL LOW (ref 10–30)

## 2021-03-09 LAB — ETHANOL: Alcohol, Ethyl (B): <10 mg/dL (ref <10)

## 2021-03-09 LAB — I-STAT BETA HCG BLOOD, ED (MC, WL, AP ONLY): I-stat hCG, quantitative: <5 m[IU]/mL (ref <5)

## 2021-03-09 LAB — SALICYLATE LEVEL: Salicylate Lvl: <7 mg/dL — ABNORMAL LOW (ref 7.0–30.0)

## 2021-03-09 MED ORDER — NALOXONE HCL 2 MG/2ML IJ SOSY
PREFILLED_SYRINGE | INTRAMUSCULAR | Status: AC | PRN
  Administered 2021-03-09: 1 mg via INTRAVENOUS

## 2021-03-09 MED ORDER — ONDANSETRON HCL 4 MG/2ML IJ SOLN
4.0000 mg | Freq: Once | INTRAMUSCULAR | Status: AC
  Administered 2021-03-09: 4 mg via INTRAVENOUS

## 2021-03-09 MED ORDER — NALOXONE HCL 2 MG/2ML IJ SOSY
PREFILLED_SYRINGE | INTRAMUSCULAR | Status: AC
  Filled 2021-03-09: qty 2

## 2021-03-09 MED ORDER — ONDANSETRON HCL 4 MG/2ML IJ SOLN
INTRAMUSCULAR | Status: AC
  Filled 2021-03-09: qty 2

## 2021-03-09 NOTE — Discharge Instructions (Signed)
Refrain from illicit drug use or taking medications that do not belong to you. I have attached resources for substance abuse help if you would like to take advantage of this. Return here for new concerns.

## 2021-03-09 NOTE — ED Notes (Signed)
VSS, pt alert

## 2021-03-09 NOTE — ED Triage Notes (Signed)
Pt dropped off at front door unresponsive, pinpoint pupils, reported that "she likes to party"

## 2021-03-09 NOTE — ED Provider Notes (Signed)
[facility] EMERGENCY DEPARTMENT Provider Note   CSN: [id number] Arrival date & time: 03/09/21  5427     History  CC:  UNRESPONSIVE  Jessica Walton is a 35 y.o. female.  The history is provided by medical records.   35 y.o. F with hx of asthma, bipolar disorder, cocaine abuse, HTN, opiate abuse, PTSD, THC dependence, presenting to the ED unresponsive.  Patient was apparently dropped off by friend at front door who reported "she likes to party".  Unknown ingestion time or substance.  No other history known at this time.  Past Medical History:  Diagnosis Date   Asthma    No inhaler use x 1 yr (2013)   Bipolar 1 disorder (HCC)    Cocaine abuse (HCC) 2018   Headache    History of suicide attempt 2015   Hypertension    Lordosis    Opiate abuse (HCC) 2018   PTSD (post-traumatic stress disorder)    Seizures (HCC)    Tetrahydrocannabinol (THC) dependence (HCC)     Patient Active Problem List   Diagnosis Date Noted   Incompetency, cervical 10/07/2017   Inevitable abortion 10/06/2017   Cervical incompetence, antepartum 10/06/2017   Tetrahydrocannabinol (THC) dependence (HCC)    Seizures (HCC)    PTSD (post-traumatic stress disorder)    Bipolar 1 disorder (HCC)    Opiate abuse (HCC) 07/25/2016   Cocaine abuse (HCC) 07/25/2016   Seizure, convulsion (HCC) 04/30/2015   History of suicide attempt 07/25/2013   Supervision of high-risk pregnancy 07/03/2013   History of prior pregnancy with short cervix, currently pregnant in second trimester 07/03/2013    Past Surgical History:  Procedure Laterality Date   EYE SURGERY     WISDOM TOOTH EXTRACTION     x4     OB History     Gravida  5   Para  3   Term  1   Preterm  1   AB  2   Living  1      SAB  1   IAB  1   Ectopic  0   Multiple  0   Live Births  1           Family History  Problem Relation Age of Onset   Hypertension Maternal Grandmother    Diabetes Maternal Grandmother     Seizures Neg Hx     Social History   Tobacco Use   Smoking status: Some Days    Packs/day: 0.50    Years: 10.00    Pack years: 5.00    Types: Cigarettes   Smokeless tobacco: Never  Substance Use Topics   Alcohol use: Yes    Alcohol/week: 0.0 standard drinks    Comment: occasional   Drug use: No    Home Medications Prior to Admission medications   Medication Sig Start Date End Date Taking? Authorizing Provider  azithromycin (ZITHROMAX Z-PAK) 250 MG tablet Take 2 po the first day then once a day for the next 4 days. 09/19/18   Devoria Albe, MD  cephALEXin (KEFLEX) 500 MG capsule Take 1 capsule (500 mg total) by mouth 4 (four) times daily. Patient not taking: Reported on 08/28/2018 10/12/17   Constant, Peggy, MD  guaifenesin (ROBITUSSIN) 100 MG/5ML syrup Take 200 mg by mouth 3 (three) times daily as needed for cough.    [provider]  ibuprofen (ADVIL,MOTRIN) 600 MG tablet Take 1 tablet (600 mg total) by mouth every 8 (eight) hours as needed for moderate  pain. 08/28/18   Shaune Pollack, MD  ondansetron (ZOFRAN) 4 MG tablet Take 1 tablet (4 mg total) by mouth every 8 (eight) hours as needed for nausea or vomiting. 09/19/18   Devoria Albe, MD  oseltamivir (TAMIFLU) 75 MG capsule Take 1 capsule (75 mg total) by mouth every 12 (twelve) hours. 09/19/18   Devoria Albe, MD  Prenatal Vit-Fe Fumarate-FA (PRENATAL MULTIVITAMIN) TABS tablet Take 1 tablet by mouth daily at 12 noon. Patient not taking: Reported on 08/28/2018 10/13/17   Constant, Peggy, MD    Allergies    Benadryl [diphenhydramine hcl] and Vicodin [hydrocodone-acetaminophen]  Review of Systems   Review of Systems  Unable to perform ROS: Patient unresponsive   Physical Exam Updated Vital Signs BP 123/85 (BP Location: Right Arm)   Pulse 92   Resp 13   SpO2 93%   Physical Exam Vitals and nursing note reviewed.  Constitutional:      Appearance: She is well-developed.     Comments: Moans to deep sternal rub, does not open  eyes or make any meaningful responses  HENT:     Head: Normocephalic and atraumatic.  Eyes:     Conjunctiva/sclera: Conjunctivae normal.     Pupils: Pupils are equal, round, and reactive to light.     Comments: Pinpoint pupils  Cardiovascular:     Rate and Rhythm: Normal rate and regular rhythm.     Heart sounds: Normal heart sounds.  Pulmonary:     Effort: Pulmonary effort is normal.     Breath sounds: Normal breath sounds. No wheezing or rhonchi.     Comments: Normal RR, no distress Abdominal:     General: Bowel sounds are normal.     Palpations: Abdomen is soft.  Musculoskeletal:        General: Normal range of motion.     Cervical back: Normal range of motion.  Skin:    General: Skin is warm and dry.  Neurological:     Mental Status: She is oriented to person, place, and time.    ED Results / Procedures / Treatments   Labs (all labs ordered are listed, but only abnormal results are displayed) Labs Reviewed  CBC WITH DIFFERENTIAL/PLATELET  COMPREHENSIVE METABOLIC PANEL  ETHANOL  RAPID URINE DRUG SCREEN, HOSP PERFORMED  SALICYLATE LEVEL  ACETAMINOPHEN LEVEL  CBG MONITORING, ED  I-STAT BETA HCG BLOOD, ED ([facility], [facility], [facility] ONLY)    EKG None  Radiology No results found.  Procedures Procedures   CRITICAL CARE Performed by: Garlon Hatchet   Total critical care time: 45 minutes  Critical care time was exclusive of separately billable procedures and treating other patients.  Critical care was necessary to treat or prevent imminent or life-threatening deterioration.  Critical care was time spent personally by me on the following activities: development of treatment plan with patient and/or surrogate as well as nursing, discussions with consultants, evaluation of patient's response to treatment, examination of patient, obtaining history from patient or surrogate, ordering and performing treatments and interventions, ordering and review of laboratory studies, ordering and  review of radiographic studies, pulse oximetry and re-evaluation of patient's condition.   Medications Ordered in ED Medications  naloxone (NARCAN) injection ( Intravenous Canceled Entry 03/09/21 0530)  ondansetron (ZOFRAN) injection 4 mg (4 mg Intravenous Given 03/09/21 5631)    ED Course  I have reviewed the triage vital signs and the nursing notes.  Pertinent labs & imaging results that were available during my care of the patient were reviewed by  me and considered in my medical decision making (see chart for details).    MDM Rules/Calculators/A&P                           35 y.o. female presenting to the ED after being dropped off unresponsive at the front door.  She does moan to deep sternal rub but is not providing any meaningful responses.  She does have pinpoint pupils.  Friend reported she "likes to party".  Unknown ingestion substance or time.  She was given Narcan and aroused, now able to answer questions and follow commands.  She reports being given a "perc" and does have EtOH on board.  She does have known history of polysubstance abuse.  Labs are pending.  She is HD stable at present, will monitor closely.  6:40 AM Remainder of labs pending.  Care will be signed out to oncoming provider.  If reassuring and she remains HD stable without needing further narcan, can likely discharge home.  Will provide substance abuse resources if desired.  Final Clinical Impression(s) / ED Diagnoses Final diagnoses:  Polysubstance abuse [facility])    Rx / DC Orders ED Discharge Orders     None        Garlon Hatchet, PA-C 03/09/21 7017    Shon Baton, MD 03/10/21 0700

## 2021-03-09 NOTE — ED Provider Notes (Signed)
Care assumed from Sharilyn Sites, PA-C, at shift change, please see their notes for full documentation of patient's complaint/HPI. Briefly, pt here with after friends found her unresponsive. Results so far show unremarkable labs so far. Awaiting remainder of labs (tylenol, aspirin, and EtOH). Plan is to watch patient for awhile, make sure she can ambulate safely, and then dispo accordingly.   Physical Exam  BP 113/89   Pulse 96   Temp 98 F (36.7 C) (Temporal)   Resp 16   SpO2 100%   Physical Exam Vitals and nursing note reviewed.  Constitutional:      Appearance: She is not ill-appearing.  HENT:     Head: Normocephalic and atraumatic.  Eyes:     Conjunctiva/sclera: Conjunctivae normal.  Cardiovascular:     Rate and Rhythm: Normal rate and regular rhythm.  Pulmonary:     Effort: Pulmonary effort is normal.     Breath sounds: Normal breath sounds.  Skin:    General: Skin is warm and dry.     Coloration: Skin is not jaundiced.  Neurological:     Mental Status: She is alert.    ED Course/Procedures     Procedures  Results for orders placed or performed during the hospital encounter of 03/09/21  CBC with Differential  Result Value Ref Range   WBC 7.2 4.0 - 10.5 K/uL   RBC 4.27 3.87 - 5.11 MIL/uL   Hemoglobin 12.4 12.0 - 15.0 g/dL   HCT 31.5 40.0 - 86.7 %   MCV 90.4 80.0 - 100.0 fL   MCH 29.0 26.0 - 34.0 pg   MCHC 32.1 30.0 - 36.0 g/dL   RDW 61.9 50.9 - 32.6 %   Platelets 326 150 - 400 K/uL   nRBC 0.0 0.0 - 0.2 %   Neutrophils Relative % 49 %   Neutro Abs 3.5 1.7 - 7.7 K/uL   Lymphocytes Relative 40 %   Lymphs Abs 2.9 0.7 - 4.0 K/uL   Monocytes Relative 7 %   Monocytes Absolute 0.5 0.1 - 1.0 K/uL   Eosinophils Relative 4 %   Eosinophils Absolute 0.3 0.0 - 0.5 K/uL   Basophils Relative 0 %   Basophils Absolute 0.0 0.0 - 0.1 K/uL   Immature Granulocytes 0 %   Abs Immature Granulocytes 0.03 0.00 - 0.07 K/uL  Comprehensive metabolic panel  Result Value Ref Range    Sodium 137 135 - 145 mmol/L   Potassium 3.4 (L) 3.5 - 5.1 mmol/L   Chloride 103 98 - 111 mmol/L   CO2 25 22 - 32 mmol/L   Glucose, Bld 88 70 - 99 mg/dL   BUN 16 6 - 20 mg/dL   Creatinine, Ser 7.12 (H) 0.44 - 1.00 mg/dL   Calcium 9.2 8.9 - 45.8 mg/dL   Total Protein 6.7 6.5 - 8.1 g/dL   Albumin 3.8 3.5 - 5.0 g/dL   AST 19 15 - 41 U/L   ALT 11 0 - 44 U/L   Alkaline Phosphatase 55 38 - 126 U/L   Total Bilirubin 0.2 (L) 0.3 - 1.2 mg/dL   GFR, Estimated >09 >98 mL/min   Anion gap 9 5 - 15  Ethanol  Result Value Ref Range   Alcohol, Ethyl (B) <10 <10 mg/dL  Salicylate level  Result Value Ref Range   Salicylate Lvl <7.0 (L) 7.0 - 30.0 mg/dL  Acetaminophen level  Result Value Ref Range   Acetaminophen (Tylenol), Serum <10 (L) 10 - 30 ug/mL  CBG monitoring, ED  Result  Value Ref Range   Glucose-Capillary 83 70 - 99 mg/dL  I-Stat Beta hCG blood, ED ([facility], [facility], [facility] only)  Result Value Ref Range   I-stat hCG, quantitative <5.0 <5 mIU/mL   Comment 3            MDM  Pt found wandering the halls agitated stating that she needs to leave and pick her mother [place] from [place]. She has not urinated for [place] however she is ambulatory and does not require assistance. Stable for discharge.        Tanda Rockers, PA-C 03/09/21 8144    Pricilla Loveless, MD 03/10/21 [phone]

## 2021-03-09 NOTE — ED Notes (Signed)
Pt responds to narcan, reports she took some pills and ETOH use tonight

## 2021-03-16 ENCOUNTER — Other Ambulatory Visit: Payer: Self-pay

## 2021-03-16 ENCOUNTER — Ambulatory Visit (HOSPITAL_COMMUNITY)
Admission: EM | Admit: 2021-03-16 | Discharge: 2021-03-16 | Disposition: A | Discharge status: Home / Self Care | Payer: Medicaid Other | Attending: Physician Assistant | Admitting: Physician Assistant

## 2021-03-16 ENCOUNTER — Encounter (HOSPITAL_COMMUNITY): Payer: Self-pay

## 2021-03-16 DIAGNOSIS — R8290 Unspecified abnormal findings in urine: Secondary | ICD-10-CM

## 2021-03-16 DIAGNOSIS — N898 Other specified noninflammatory disorders of vagina: Secondary | ICD-10-CM | POA: Diagnosis not present

## 2021-03-16 DIAGNOSIS — Z113 Encounter for screening for infections with a predominantly sexual mode of transmission: Secondary | ICD-10-CM | POA: Diagnosis not present

## 2021-03-16 DIAGNOSIS — R1030 Lower abdominal pain, unspecified: Secondary | ICD-10-CM | POA: Diagnosis not present

## 2021-03-16 LAB — POCT URINALYSIS DIPSTICK, ED / UC
Glucose, UA: NEGATIVE mg/dL
Ketones, ur: >=160 mg/dL — AB
Leukocytes,Ua: NEGATIVE
Nitrite: NEGATIVE
Protein, ur: 100 mg/dL — AB
Specific Gravity, Urine: 1.025 (ref 1.005–1.030)
Urobilinogen, UA: 4 mg/dL — ABNORMAL HIGH (ref 0.0–1.0)
pH: 6 (ref 5.0–8.0)

## 2021-03-16 LAB — POC URINE PREG, ED: Preg Test, Ur: NEGATIVE

## 2021-03-16 MED ORDER — METRONIDAZOLE 500 MG PO TABS: 500.0000 mg | ORAL_TABLET | Freq: Two times a day (BID) | ORAL | 0 refills | Status: DC

## 2021-03-16 NOTE — ED Provider Notes (Signed)
[facility]    CSN: [id number] Arrival date & time: 03/16/21  1250      History   Chief Complaint Chief Complaint  Patient presents with   SEXUALLY TRANSMITTED DISEASE    HPI Jessica Walton is a 35 y.o. female.   Patient presents today with a 2-day history of vaginal discharge.  She describes this as copious, thin, malodorous.  She does report some lower abdominal discomfort but denies any pelvic pain.  She has no concern for pregnancy and she is currently on her menstrual cycle.  She has had STI in the past and states current symptoms are similar to previous episodes of this condition.  She denies any known specific exposure but does report new partners.  She denies any changes to personal hygiene products including soaps, detergents, personal hygiene products.  Denies any recent antibiotic use.  She does report some associated vaginal irritation but denies any significant pain.  She denies any recent antibiotic use.   Past Medical History:  Diagnosis Date   Asthma    No inhaler use x 1 yr (2013)   Bipolar 1 disorder (HCC)    Cocaine abuse (HCC) 2018   Headache    History of suicide attempt 2015   Hypertension    Lordosis    Opiate abuse (HCC) 2018   PTSD (post-traumatic stress disorder)    Seizures (HCC)    Tetrahydrocannabinol (THC) dependence (HCC)     Patient Active Problem List   Diagnosis Date Noted   Incompetency, cervical 10/07/2017   Inevitable abortion 10/06/2017   Cervical incompetence, antepartum 10/06/2017   Tetrahydrocannabinol (THC) dependence (HCC)    Seizures (HCC)    PTSD (post-traumatic stress disorder)    Bipolar 1 disorder (HCC)    Opiate abuse (HCC) 07/25/2016   Cocaine abuse (HCC) 07/25/2016   Seizure, convulsion (HCC) 04/30/2015   History of suicide attempt 07/25/2013   Supervision of high-risk pregnancy 07/03/2013   History of prior pregnancy with short cervix, currently pregnant in second trimester 07/03/2013    Past  Surgical History:  Procedure Laterality Date   EYE SURGERY     WISDOM TOOTH EXTRACTION     x4    OB History     Gravida  5   Para  3   Term  1   Preterm  1   AB  2   Living  1      SAB  1   IAB  1   Ectopic  0   Multiple  0   Live Births  1            Home Medications    Prior to Admission medications   Medication Sig Start Date End Date Taking? Authorizing Provider  metroNIDAZOLE (FLAGYL) 500 MG tablet Take 1 tablet (500 mg total) by mouth 2 (two) times daily. 03/16/21  Yes Aislee Landgren K, PA-C  guaifenesin (ROBITUSSIN) 100 MG/5ML syrup Take 200 mg by mouth 3 (three) times daily as needed for cough.    [provider]  ibuprofen (ADVIL,MOTRIN) 600 MG tablet Take 1 tablet (600 mg total) by mouth every 8 (eight) hours as needed for moderate pain. 08/28/18   Shaune Pollack, MD  ondansetron (ZOFRAN) 4 MG tablet Take 1 tablet (4 mg total) by mouth every 8 (eight) hours as needed for nausea or vomiting. 09/19/18   Devoria Albe, MD  oseltamivir (TAMIFLU) 75 MG capsule Take 1 capsule (75 mg total) by mouth every 12 (twelve) hours. 09/19/18  Devoria Albe, MD  Prenatal Vit-Fe Fumarate-FA (PRENATAL MULTIVITAMIN) TABS tablet Take 1 tablet by mouth daily at 12 noon. Patient not taking: Reported on 08/28/2018 10/13/17   Constant, Peggy, MD    Family History Family History  Problem Relation Age of Onset   Hypertension Maternal Grandmother    Diabetes Maternal Grandmother    Seizures Neg Hx     Social History Social History   Tobacco Use   Smoking status: Some Days    Packs/day: 0.50    Years: 10.00    Pack years: 5.00    Types: Cigarettes   Smokeless tobacco: Never  Substance Use Topics   Alcohol use: Yes    Alcohol/week: 0.0 standard drinks    Comment: occasional   Drug use: No     Allergies   Benadryl [diphenhydramine hcl] and Vicodin [hydrocodone-acetaminophen]   Review of Systems Review of Systems  Constitutional:  Negative for activity change,  appetite change, fatigue and fever.  Respiratory:  Negative for cough and shortness of breath.   Cardiovascular:  Negative for chest pain.  Gastrointestinal:  Positive for abdominal pain. Negative for diarrhea, nausea and vomiting.  Genitourinary:  Positive for vaginal bleeding and vaginal discharge. Negative for dysuria, flank pain, frequency, menstrual problem, urgency and vaginal pain.  Neurological:  Negative for dizziness, light-headedness and headaches.    Physical Exam Triage Vital Signs ED Triage Vitals  Enc Vitals Group     BP 03/16/21 1439 138/89     Pulse Rate 03/16/21 1439 93     Resp 03/16/21 1439 17     Temp 03/16/21 1439 98.2 F (36.8 C)     Temp Source 03/16/21 1439 Oral     SpO2 03/16/21 1439 100 %     Weight --      Height --      Head Circumference --      Peak Flow --      Pain Score 03/16/21 1437 9     Pain Loc --      Pain Edu? --      Excl. in GC? --    No data found.  Updated Vital Signs BP 138/89   Pulse 93   Temp 98.2 F (36.8 C) (Oral)   Resp 17   LMP 03/14/2021 (Exact Date)   SpO2 100%   Visual Acuity Right Eye Distance:   Left Eye Distance:   Bilateral Distance:    Right Eye Near:   Left Eye Near:    Bilateral Near:     Physical Exam Vitals reviewed.  Constitutional:      General: She is awake. She is not in acute distress.    Appearance: Normal appearance. She is normal weight. She is not ill-appearing.     Comments: Very pleasant female appears stated age in no acute distress sitting comfortably in exam room  HENT:     Head: Normocephalic and atraumatic.  Cardiovascular:     Rate and Rhythm: Normal rate and regular rhythm.     Heart sounds: Normal heart sounds, S1 normal and S2 normal. No murmur heard. Pulmonary:     Effort: Pulmonary effort is normal.     Breath sounds: Normal breath sounds. No wheezing, rhonchi or rales.     Comments: Clear to auscultation bilaterally Abdominal:     General: Bowel sounds are normal.      Palpations: Abdomen is soft.     Tenderness: There is generalized abdominal tenderness. There is no right CVA tenderness, left CVA  tenderness, guarding or rebound.     Comments: Mild tenderness palpation throughout abdomen.  No evidence of acute abdomen on physical exam.  Genitourinary:    Comments: Exam deferred Psychiatric:        Behavior: Behavior is cooperative.     UC Treatments / Results  Labs (all labs ordered are listed, but only abnormal results are displayed) Labs Reviewed  POCT URINALYSIS DIPSTICK, ED / UC - Abnormal; Notable for the following components:      Result Value   Bilirubin Urine MODERATE (*)    Ketones, ur >=160 (*)    Hgb urine dipstick LARGE (*)    Protein, ur 100 (*)    Urobilinogen, UA 4.0 (*)    All other components within normal limits  URINE CULTURE  POC URINE PREG, ED  CERVICOVAGINAL ANCILLARY ONLY    EKG   Radiology No results found.  Procedures Procedures (including critical care time)  Medications Ordered in UC Medications - No data to display  Initial Impression / Assessment and Plan / UC Course  I have reviewed the triage vital signs and the nursing notes.  Pertinent labs & imaging results that were available during my care of the patient were reviewed by me and considered in my medical decision making (see chart for details).      Urine pregnancy was negative.  UA showed hematuria likely related to menstrual cycle but given suprapubic pain will order urine culture but defer additional antibiotic treatment until results are obtained.  STI swab collected today-results pending.  Empirically treat for bacterial vaginosis given clinical presentation.  Patient was started on metronidazole 500 mg twice daily for 7 days.  Discussed that she is not to drink any alcohol while taking this medication and for 3 days after completing course as it will cause vomiting due to Antabuse side effects.  Discussed that she is to abstain from sexual  activity until results are obtained and all partners have been tested and treated.  Discussed the importance of safe sex.  Discussed alarm symptoms that warrant emergent evaluation.  Strict return precautions given to which patient expressed understanding.  Final Clinical Impressions(s) / UC Diagnoses   Final diagnoses:  Lower abdominal pain  Vaginal discharge  Routine screening for STI (sexually transmitted infection)  Abnormal urinalysis     Discharge Instructions      Your urine did not show significant evidence of infection but it did show blood so going to send off for culture to make sure you are not having any current infection.  Suspect this is related to her menstrual cycle.  I have called in metronidazole to treat bacterial vaginosis as we discussed.  We will contact you with your swab results if we need to arrange any additional treatment.  You should not take anything that contains alcohol or drink any alcohol while on this medication and for 3 days after finishing as as it can cause you to projectile vomit.  If you develop any worsening symptoms you need to be reevaluated.     ED Prescriptions     Medication Sig Dispense Auth. Provider   metroNIDAZOLE (FLAGYL) 500 MG tablet Take 1 tablet (500 mg total) by mouth 2 (two) times daily. 14 tablet Brentley Landfair, Noberto Retort, PA-C      PDMP not reviewed this encounter.   Jeani Hawking, PA-C 03/16/21 1552

## 2021-03-16 NOTE — ED Triage Notes (Signed)
Pt presents with vaginal discharge, odor and dysuria X 2 days.  States she has stomach cramps.  Pt states she is unaware of exposure to someone with an STD.

## 2021-03-16 NOTE — Discharge Instructions (Addendum)
Your urine did not show significant evidence of infection but it did show blood so going to send off for culture to make sure you are not having any current infection.  Suspect this is related to her menstrual cycle.  I have called in metronidazole to treat bacterial vaginosis as we discussed.  We will contact you with your swab results if we need to arrange any additional treatment.  You should not take anything that contains alcohol or drink any alcohol while on this medication and for 3 days after finishing as as it can cause you to projectile vomit.  If you develop any worsening symptoms you need to be reevaluated.

## 2021-03-17 LAB — CERVICOVAGINAL ANCILLARY ONLY
Bacterial Vaginitis (gardnerella): POSITIVE — AB
Candida Glabrata: NEGATIVE
Candida Vaginitis: POSITIVE — AB
Chlamydia: NEGATIVE
Comment: NEGATIVE
Comment: NEGATIVE
Comment: NEGATIVE
Comment: NEGATIVE
Comment: NEGATIVE
Comment: NORMAL
Neisseria Gonorrhea: POSITIVE — AB
Trichomonas: NEGATIVE

## 2021-03-18 ENCOUNTER — Telehealth (HOSPITAL_COMMUNITY): Payer: Self-pay | Admitting: Emergency Medicine

## 2021-03-18 LAB — URINE CULTURE: Culture: <10000 — AB

## 2021-03-18 MED ORDER — FLUCONAZOLE 150 MG PO TABS: 150.0000 mg | ORAL_TABLET | Freq: Once | ORAL | 0 refills | Status: AC

## 2021-08-02 ENCOUNTER — Emergency Department (HOSPITAL_COMMUNITY)
Admission: EM | Admit: 2021-08-02 | Discharge: 2021-08-02 | Disposition: A | Discharge status: Home / Self Care | Payer: Medicaid Other | Attending: Emergency Medicine | Admitting: Emergency Medicine

## 2021-08-02 ENCOUNTER — Other Ambulatory Visit: Payer: Self-pay

## 2021-08-02 DIAGNOSIS — F10129 Alcohol abuse with intoxication, unspecified: Secondary | ICD-10-CM | POA: Insufficient documentation

## 2021-08-02 DIAGNOSIS — Z5321 Procedure and treatment not carried out due to patient leaving prior to being seen by health care provider: Secondary | ICD-10-CM | POA: Insufficient documentation

## 2021-08-02 DIAGNOSIS — Y907 Blood alcohol level of 200-239 mg/100 ml: Secondary | ICD-10-CM | POA: Diagnosis not present

## 2021-08-02 LAB — CBC WITH DIFFERENTIAL/PLATELET
Abs Immature Granulocytes: 0.01 10*3/uL (ref 0.00–0.07)
Basophils Absolute: 0 10*3/uL (ref 0.0–0.1)
Basophils Relative: 1 %
Eosinophils Absolute: 0.1 10*3/uL (ref 0.0–0.5)
Eosinophils Relative: 1 %
HCT: 38.5 % (ref 36.0–46.0)
Hemoglobin: 12.8 g/dL (ref 12.0–15.0)
Immature Granulocytes: 0 %
Lymphocytes Relative: 53 %
Lymphs Abs: 3.4 10*3/uL (ref 0.7–4.0)
MCH: 29.6 pg (ref 26.0–34.0)
MCHC: 33.2 g/dL (ref 30.0–36.0)
MCV: 88.9 fL (ref 80.0–100.0)
Monocytes Absolute: 0.4 10*3/uL (ref 0.1–1.0)
Monocytes Relative: 6 %
Neutro Abs: 2.5 10*3/uL (ref 1.7–7.7)
Neutrophils Relative %: 39 %
Platelets: 334 10*3/uL (ref 150–400)
RBC: 4.33 MIL/uL (ref 3.87–5.11)
RDW: 14.7 % (ref 11.5–15.5)
WBC: 6.4 10*3/uL (ref 4.0–10.5)
nRBC: 0 % (ref 0.0–0.2)

## 2021-08-02 LAB — COMPREHENSIVE METABOLIC PANEL
ALT: 10 U/L (ref 0–44)
AST: 15 U/L (ref 15–41)
Albumin: 3.9 g/dL (ref 3.5–5.0)
Alkaline Phosphatase: 53 U/L (ref 38–126)
Anion gap: 10 (ref 5–15)
BUN: 15 mg/dL (ref 6–20)
CO2: 22 mmol/L (ref 22–32)
Calcium: 8.8 mg/dL — ABNORMAL LOW (ref 8.9–10.3)
Chloride: 107 mmol/L (ref 98–111)
Creatinine, Ser: 0.94 mg/dL (ref 0.44–1.00)
GFR, Estimated: >60 mL/min (ref >60)
Glucose, Bld: 94 mg/dL (ref 70–99)
Potassium: 3.3 mmol/L — ABNORMAL LOW (ref 3.5–5.1)
Sodium: 139 mmol/L (ref 135–145)
Total Bilirubin: 0.3 mg/dL (ref 0.3–1.2)
Total Protein: 6.9 g/dL (ref 6.5–8.1)

## 2021-08-02 LAB — ACETAMINOPHEN LEVEL: Acetaminophen (Tylenol), Serum: <10 ug/mL — ABNORMAL LOW (ref 10–30)

## 2021-08-02 LAB — SALICYLATE LEVEL: Salicylate Lvl: <7 mg/dL — ABNORMAL LOW (ref 7.0–30.0)

## 2021-08-02 LAB — ETHANOL: Alcohol, Ethyl (B): 206 mg/dL — ABNORMAL HIGH (ref <10)

## 2021-08-02 NOTE — ED Notes (Signed)
0.4 IN Narcan given [facility] EDP

## 2021-08-02 NOTE — ED Triage Notes (Signed)
Pt to ED responsive to painful stimuli, unknown drug/alcohol consumption. 0.4mg  Narcan IM given in triage [facility] EDP

## 2021-08-02 NOTE — ED Provider Triage Note (Signed)
Emergency Medicine Provider Triage Evaluation Note  Saadia E. Bednarski , a 36 y.o. female  was evaluated in triage.  Patient intoxicated with unknown substance.  LEVEL 5 caveat applies due to acuity of condition.  Review of Systems  Positive: Unable to assess Negative: Unable to assess  Physical Exam  BP 109/62 (BP Location: Right Arm)    Pulse 74    Temp 98.8 F (37.1 C) (Oral)    Resp 12    SpO2 99%  Gen:   Somnolent  Resp:  Normal effort  MSK:   Withdraws from pain Other:  Pin point pupils  Medical Decision Making  Medically screening exam initiated at 10:28 PM.  Appropriate orders placed.  Aishani E. Emanuelson was informed that the remainder of the evaluation will be completed by another provider, this initial triage assessment does not replace that evaluation, and the importance of remaining in the ED until their evaluation is complete.  Overdose  Narcan trial in triage   Roxy Horseman, PA-C 08/02/21 2230

## 2021-08-02 NOTE — ED Notes (Signed)
Patient states she wants to leave. Her ride is here to pick her up. Triage RN notified and IV taken out by this writer

## 2021-08-03 LAB — I-STAT BETA HCG BLOOD, ED (MC, WL, AP ONLY): I-stat hCG, quantitative: <5 m[IU]/mL (ref <5)

## 2022-03-30 ENCOUNTER — Other Ambulatory Visit: Payer: Self-pay

## 2022-03-30 ENCOUNTER — Encounter (HOSPITAL_COMMUNITY): Payer: Self-pay | Admitting: Emergency Medicine

## 2022-03-30 ENCOUNTER — Emergency Department (HOSPITAL_COMMUNITY)
Admission: EM | Admit: 2022-03-30 | Discharge: 2022-03-30 | Disposition: A | Discharge status: Home / Self Care | Payer: Commercial Managed Care - HMO | Attending: Emergency Medicine | Admitting: Emergency Medicine

## 2022-03-30 DIAGNOSIS — E869 Volume depletion, unspecified: Secondary | ICD-10-CM | POA: Diagnosis not present

## 2022-03-30 DIAGNOSIS — N179 Acute kidney failure, unspecified: Secondary | ICD-10-CM | POA: Diagnosis not present

## 2022-03-30 DIAGNOSIS — F1110 Opioid abuse, uncomplicated: Secondary | ICD-10-CM | POA: Diagnosis not present

## 2022-03-30 DIAGNOSIS — Z20822 Contact with and (suspected) exposure to covid-19: Secondary | ICD-10-CM | POA: Insufficient documentation

## 2022-03-30 DIAGNOSIS — E876 Hypokalemia: Secondary | ICD-10-CM | POA: Insufficient documentation

## 2022-03-30 DIAGNOSIS — R030 Elevated blood-pressure reading, without diagnosis of hypertension: Secondary | ICD-10-CM

## 2022-03-30 DIAGNOSIS — F1910 Other psychoactive substance abuse, uncomplicated: Secondary | ICD-10-CM

## 2022-03-30 DIAGNOSIS — R944 Abnormal results of kidney function studies: Secondary | ICD-10-CM | POA: Diagnosis not present

## 2022-03-30 LAB — RESP PANEL BY RT-PCR (FLU A&B, COVID) ARPGX2
Influenza A by PCR: NEGATIVE
Influenza B by PCR: NEGATIVE
SARS Coronavirus 2 by RT PCR: NEGATIVE

## 2022-03-30 LAB — I-STAT CHEM 8, ED
BUN: 37 mg/dL — ABNORMAL HIGH (ref 6–20)
Calcium, Ion: 1.08 mmol/L — ABNORMAL LOW (ref 1.15–1.40)
Chloride: 99 mmol/L (ref 98–111)
Creatinine, Ser: 1.3 mg/dL — ABNORMAL HIGH (ref 0.44–1.00)
Glucose, Bld: 117 mg/dL — ABNORMAL HIGH (ref 70–99)
HCT: 52 % — ABNORMAL HIGH (ref 36.0–46.0)
Hemoglobin: 17.7 g/dL — ABNORMAL HIGH (ref 12.0–15.0)
Potassium: 3.1 mmol/L — ABNORMAL LOW (ref 3.5–5.1)
Sodium: 143 mmol/L (ref 135–145)
TCO2: 32 mmol/L (ref 22–32)

## 2022-03-30 LAB — CBC WITH DIFFERENTIAL/PLATELET
Abs Immature Granulocytes: 0.05 10*3/uL (ref 0.00–0.07)
Basophils Absolute: 0 10*3/uL (ref 0.0–0.1)
Basophils Relative: 0 %
Eosinophils Absolute: 0 10*3/uL (ref 0.0–0.5)
Eosinophils Relative: 0 %
HCT: 50.9 % — ABNORMAL HIGH (ref 36.0–46.0)
Hemoglobin: 17.8 g/dL — ABNORMAL HIGH (ref 12.0–15.0)
Immature Granulocytes: 0 %
Lymphocytes Relative: 13 %
Lymphs Abs: 1.5 10*3/uL (ref 0.7–4.0)
MCH: 29.8 pg (ref 26.0–34.0)
MCHC: 35 g/dL (ref 30.0–36.0)
MCV: 85.1 fL (ref 80.0–100.0)
Monocytes Absolute: 0.6 10*3/uL (ref 0.1–1.0)
Monocytes Relative: 5 %
Neutro Abs: 9.2 10*3/uL — ABNORMAL HIGH (ref 1.7–7.7)
Neutrophils Relative %: 82 %
Platelets: 414 10*3/uL — ABNORMAL HIGH (ref 150–400)
RBC: 5.98 MIL/uL — ABNORMAL HIGH (ref 3.87–5.11)
RDW: 16.3 % — ABNORMAL HIGH (ref 11.5–15.5)
WBC: 11.3 10*3/uL — ABNORMAL HIGH (ref 4.0–10.5)
nRBC: 0 % (ref 0.0–0.2)

## 2022-03-30 LAB — COMPREHENSIVE METABOLIC PANEL
ALT: 19 U/L (ref 0–44)
AST: 27 U/L (ref 15–41)
Albumin: 5.3 g/dL — ABNORMAL HIGH (ref 3.5–5.0)
Alkaline Phosphatase: 46 U/L (ref 38–126)
Anion gap: 18 — ABNORMAL HIGH (ref 5–15)
BUN: 29 mg/dL — ABNORMAL HIGH (ref 6–20)
CO2: 24 mmol/L (ref 22–32)
Calcium: 10.3 mg/dL (ref 8.9–10.3)
Chloride: 98 mmol/L (ref 98–111)
Creatinine, Ser: 1.6 mg/dL — ABNORMAL HIGH (ref 0.44–1.00)
GFR, Estimated: 43 mL/min — ABNORMAL LOW (ref >60)
Glucose, Bld: 140 mg/dL — ABNORMAL HIGH (ref 70–99)
Potassium: 3.1 mmol/L — ABNORMAL LOW (ref 3.5–5.1)
Sodium: 140 mmol/L (ref 135–145)
Total Bilirubin: 0.7 mg/dL (ref 0.3–1.2)
Total Protein: 10 g/dL — ABNORMAL HIGH (ref 6.5–8.1)

## 2022-03-30 LAB — ETHANOL: Alcohol, Ethyl (B): <10 mg/dL (ref <10)

## 2022-03-30 LAB — LIPASE, BLOOD: Lipase: 44 U/L (ref 11–51)

## 2022-03-30 LAB — I-STAT BETA HCG BLOOD, ED (MC, WL, AP ONLY): I-stat hCG, quantitative: <5 m[IU]/mL (ref <5)

## 2022-03-30 MED ORDER — LACTATED RINGERS IV BOLUS
1000.0000 mL | Freq: Once | INTRAVENOUS | Status: AC
  Administered 2022-03-30: 1000 mL via INTRAVENOUS

## 2022-03-30 MED ORDER — POTASSIUM CHLORIDE 20 MEQ PO PACK
40.0000 meq | PACK | Freq: Two times a day (BID) | ORAL | Status: DC
  Administered 2022-03-30: 40 meq via ORAL
  Filled 2022-03-30: qty 2

## 2022-03-30 MED ORDER — NAPROXEN 250 MG PO TABS: 500.0000 mg | ORAL_TABLET | Freq: Two times a day (BID) | ORAL | Status: DC | PRN

## 2022-03-30 MED ORDER — CLONIDINE HCL 0.1 MG PO TABS: 0.1000 mg | ORAL_TABLET | Freq: Four times a day (QID) | ORAL | Status: DC

## 2022-03-30 MED ORDER — METHOCARBAMOL 500 MG PO TABS: 500.0000 mg | ORAL_TABLET | Freq: Three times a day (TID) | ORAL | Status: DC | PRN

## 2022-03-30 MED ORDER — LOPERAMIDE HCL 2 MG PO CAPS: 2.0000 mg | ORAL_CAPSULE | ORAL | Status: DC | PRN

## 2022-03-30 MED ORDER — ONDANSETRON HCL 8 MG PO TABS
8.0000 mg | ORAL_TABLET | Freq: Three times a day (TID) | ORAL | 0 refills | Status: DC | PRN
Start: 2022-03-30 — End: 2022-04-02

## 2022-03-30 MED ORDER — HYDROXYZINE HCL 25 MG PO TABS: 25.0000 mg | ORAL_TABLET | Freq: Four times a day (QID) | ORAL | Status: DC | PRN

## 2022-03-30 MED ORDER — CLONIDINE HCL 0.1 MG PO TABS: 0.1000 mg | ORAL_TABLET | Freq: Every day | ORAL | Status: DC

## 2022-03-30 MED ORDER — DICYCLOMINE HCL 20 MG PO TABS: 20.0000 mg | ORAL_TABLET | Freq: Four times a day (QID) | ORAL | Status: DC | PRN

## 2022-03-30 MED ORDER — CLONIDINE HCL 0.1 MG PO TABS: 0.1000 mg | ORAL_TABLET | ORAL | Status: DC

## 2022-03-30 MED ORDER — CLONIDINE HCL 0.1 MG PO TABS
0.1000 mg | ORAL_TABLET | Freq: Once | ORAL | Status: AC
  Administered 2022-03-30: 0.1 mg via ORAL
  Filled 2022-03-30: qty 1

## 2022-03-30 MED ORDER — SODIUM CHLORIDE 0.9 % IV SOLN: 12.5000 mg | Freq: Four times a day (QID) | INTRAVENOUS | Status: DC | PRN

## 2022-03-30 MED ORDER — CLONIDINE HCL 0.1 MG PO TABS: 0.1000 mg | ORAL_TABLET | Freq: Two times a day (BID) | ORAL | 0 refills | Status: DC

## 2022-03-30 MED ORDER — POTASSIUM CHLORIDE CRYS ER 20 MEQ PO TBCR
20.0000 meq | EXTENDED_RELEASE_TABLET | Freq: Two times a day (BID) | ORAL | 0 refills | Status: DC
Start: 2022-03-30 — End: 2022-04-02

## 2022-03-30 NOTE — ED Provider Triage Note (Signed)
Emergency Medicine Provider Triage Evaluation Note  Jessica Walton , a 36 y.o. female  was evaluated in triage.  Pt complains of detox, patient states that she is dependent on opioids and benzos, been taking them for last 4 years, states that she stopped 3 days ago as she is tired of taking them, she now has associated stomach pains nausea vomiting, states that she just feels overall weakness, she has increased urination, denies dysuria hematuria no suicidal homicidal ideations..  Review of Systems  Positive: Weakness, nausea vomiting Negative: Homicidal or suicidal ideations  Physical Exam  BP (!) 133/103 (BP Location: Right Arm)   Pulse (!) 124   Temp 97.6 F (36.4 C)   Resp 14   LMP 03/07/2022   SpO2 100%  Gen:   Awake, no distress   Resp:  Normal effort  MSK:   Moves extremities without difficulty  Other:    Medical Decision Making  Medically screening exam initiated at 3:43 AM.  Appropriate orders placed.  Chantia E Whitehouse was informed that the remainder of the evaluation will be completed by another provider, this initial triage assessment does not replace that evaluation, and the importance of remaining in the ED until their evaluation is complete.  Lab work has been ordered will need further work-up.   Carroll Sage, PA-C 03/30/22 [phone]

## 2022-03-30 NOTE — BH Assessment (Signed)
@  1745, per patient's nurse, patient is not in a room. Will locate a room for patient to complete his TTS assessment and notify TTS when patient is ready to be seen.

## 2022-03-30 NOTE — ED Provider Notes (Signed)
Patient signed out that is receiving iv fluids. Hx polysubstance use disorder, but has not used in 3+ days. Recent nausea/vomiting/dehydration - now improved and tolerating po.   Pt alert, content, no distress. Pt is tolerating po. No recurrent vomiting. No abd pain or tenderness. Denies dysuria or gu c/o.  Pt eating/drinking, and ambulatory to bathroom.   Pt exhibits normal mood and affect. Pt does not appear acutely depressed or despondent. Pt is not responding to internal stimuli - no delusions or hallucinations noted. No acute psychosis is noted.   Pt currently appears stable for ED d/c. She is encourage to pursue sobriety, and use resource guide provided in terms of accessing substance use treatment programs. Dr Rosalia Hammers has provided rx for k replacement, as well as to help with withdrawal symptoms.   Return precautions provided.      Cathren Laine, MD 03/30/22 [phone]

## 2022-03-30 NOTE — BH Assessment (Signed)
@  1722, Called the TTS machine. No answer. Notified patients nurse of attempt to call the TTS machine.

## 2022-03-30 NOTE — ED Triage Notes (Signed)
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

## 2022-03-30 NOTE — ED Provider Notes (Addendum)
[facility] EMERGENCY DEPARTMENT Provider Note   CSN: [id number] Arrival date & time: 03/30/22  0316     History  Chief Complaint  Patient presents with   Detox: Fentanyl/Xanax addiction     Jessica Walton is a 36 y.o. female.  HPI 36 yo female ho benzo and opiate abuse.  Patient states she has been through detox and began using again 6 months ago.  Reports last use 3 days ago when she ran out and decided to quit. She denies current seizure, fever, chills.  She reports history of seizure but denies taking meds for same- she states they began at age 31 due to stress. Patient reports urinating frequently and uncontrolled.  No pain.  She has had difficulty taking po due to nausea and vomiting.     Home Medications Prior to Admission medications   Medication Sig Start Date End Date Taking? Authorizing Provider  guaifenesin (ROBITUSSIN) 100 MG/5ML syrup Take 200 mg by mouth 3 (three) times daily as needed for cough.    [provider]  ibuprofen (ADVIL,MOTRIN) 600 MG tablet Take 1 tablet (600 mg total) by mouth every 8 (eight) hours as needed for moderate pain. 08/28/18   Shaune Pollack, MD  metroNIDAZOLE (FLAGYL) 500 MG tablet Take 1 tablet (500 mg total) by mouth 2 (two) times daily. 03/16/21   Raspet, Noberto Retort, PA-C  ondansetron (ZOFRAN) 4 MG tablet Take 1 tablet (4 mg total) by mouth every 8 (eight) hours as needed for nausea or vomiting. 09/19/18   Devoria Albe, MD  oseltamivir (TAMIFLU) 75 MG capsule Take 1 capsule (75 mg total) by mouth every 12 (twelve) hours. 09/19/18   Devoria Albe, MD  Prenatal Vit-Fe Fumarate-FA (PRENATAL MULTIVITAMIN) TABS tablet Take 1 tablet by mouth daily at 12 noon. Patient not taking: Reported on 08/28/2018 10/13/17   Constant, Peggy, MD      Allergies    Benadryl [diphenhydramine hcl] and Vicodin [hydrocodone-acetaminophen]    Review of Systems   Review of Systems  Physical Exam Updated Vital Signs BP (!) 134/97 (BP Location:  Left Arm)   Pulse 66   Temp (!) 97.5 F (36.4 C) (Oral)   Resp 18   LMP 03/07/2022   SpO2 100%  Physical Exam Vitals and nursing note reviewed.  Constitutional:      Appearance: Normal appearance.  HENT:     Head: Normocephalic and atraumatic.     Right Ear: External ear normal.     Left Ear: External ear normal.     Nose: Nose normal.     Mouth/Throat:     Pharynx: Oropharynx is clear.  Eyes:     Extraocular Movements: Extraocular movements intact.     Pupils: Pupils are equal, round, and reactive to light.  Cardiovascular:     Rate and Rhythm: Normal rate and regular rhythm.  Pulmonary:     Effort: Pulmonary effort is normal.     Breath sounds: Normal breath sounds.  Abdominal:     General: Abdomen is flat.     Palpations: Abdomen is soft.  Musculoskeletal:        General: Normal range of motion.     Cervical back: Normal range of motion.  Skin:    General: Skin is warm.  Neurological:     General: No focal deficit present.     Mental Status: She is alert.  Psychiatric:        Attention and Perception: Attention normal.  Mood and Affect: Mood normal. Affect is flat.        Speech: Speech normal.        Behavior: Behavior normal. Behavior is cooperative.        Thought Content: Thought content normal.        Cognition and Memory: Cognition normal.        Judgment: Judgment normal.     ED Results / Procedures / Treatments   Labs (all labs ordered are listed, but only abnormal results are displayed) Labs Reviewed  COMPREHENSIVE METABOLIC PANEL - Abnormal; Notable for the following components:      Result Value   Potassium 3.1 (*)    Glucose, Bld 140 (*)    BUN 29 (*)    Creatinine, Ser 1.60 (*)    Total Protein 10.0 (*)    Albumin 5.3 (*)    GFR, Estimated 43 (*)    Anion gap 18 (*)    All other components within normal limits  CBC WITH DIFFERENTIAL/PLATELET - Abnormal; Notable for the following components:   WBC 11.3 (*)    RBC 5.98 (*)     Hemoglobin 17.8 (*)    HCT 50.9 (*)    RDW 16.3 (*)    Platelets 414 (*)    Neutro Abs 9.2 (*)    All other components within normal limits  I-STAT CHEM 8, ED - Abnormal; Notable for the following components:   Potassium 3.1 (*)    BUN 37 (*)    Creatinine, Ser 1.30 (*)    Glucose, Bld 117 (*)    Calcium, Ion 1.08 (*)    Hemoglobin 17.7 (*)    HCT 52.0 (*)    All other components within normal limits  RESP PANEL BY RT-PCR (FLU A&B, COVID) ARPGX2  LIPASE, BLOOD  ETHANOL  PREGNANCY, URINE  URINALYSIS, ROUTINE W REFLEX MICROSCOPIC  RAPID URINE DRUG SCREEN, HOSP PERFORMED  I-STAT BETA HCG BLOOD, ED ([facility], [facility], [facility] ONLY)    EKG EKG Interpretation  Date/Time:  Wednesday March 30 2022 08:31:29 EDT Ventricular Rate:  95 PR Interval:  144 QRS Duration: 86 QT Interval:  378 QTC Calculation: 476 R Axis:   82 Text Interpretation: Normal sinus rhythm Right atrial enlargement T wave abnormality, consider inferolateral ischemia Abnormal ECG When compared with ECG of 09-Mar-2021 05:15, PREVIOUS ECG IS PRESENT Confirmed by Margarita Grizzle [phone]) on 03/30/2022 8:45:59 AM  Radiology No results found.  Procedures Procedures    Medications Ordered in ED Medications  promethazine (PHENERGAN) 12.5 mg in sodium chloride 0.9 % 50 mL IVPB (has no administration in time range)  potassium chloride (KLOR-CON) packet 40 mEq (40 mEq Oral Given 03/30/22 1204)  lactated ringers bolus 1,000 mL (has no administration in time range)  potassium chloride (KLOR-CON) packet 40 mEq (has no administration in time range)  lactated ringers bolus 1,000 mL (0 mLs Intravenous Stopped 03/30/22 1012)  cloNIDine (CATAPRES) tablet 0.1 mg (0.1 mg Oral Given 03/30/22 0833)  lactated ringers bolus 1,000 mL (0 mLs Intravenous Stopped 03/30/22 1123)    ED Course/ Medical Decision Making/ A&P Clinical Course as of 03/30/22 1247  Wed Mar 30, 2022  0835 Cmet hypokalemia, creatinine elevated at 1.6 [DR]    Clinical Course User  Index [DR] Margarita Grizzle, MD                           Medical Decision Making 36 year old female presents today complaining of withdrawal symptoms.  She states that she took her last dose of Konix and benzos 3 days ago.  She reports nausea, vomiting, malaise weakness. Work-up here admission creatinine elevated at 1.6.  Patient hydrated with 2 L of LR.  Recheck labs When obtaining blood for pregnancy tenderness.  Repeat labs had shown creatinine decreased to 1.6-1.3.  Patient is tolerating p.o. fluids this time.  She is hypokalemic and has had p.o. replenishment. Patient also treated here with clonidine 0.1 and Phenergan 12.5.  Hemodynamically stable. She has been hydrated and creatinine is correcting. Potassium has been low and is being ordered needed Patient is resting comfortably with normal heart rate She is medically stable at this time   1 polysubstance abuse with withdrawal-patient treated here with IV fluids and clonidine.  She has remained hemodynamically stable and is tolerating po 2 acute kidney injury secondary to 1 initial creatinine elevated 1.6 patient hydrated with 2 L of LR for 30 fusing with creatinine increased from 1.2-1.3 and patient now tolerating p.o.  3 hypokalemia likely secondary to #1.  Patient being orally repleted with potassium  Urinalysis pending  Will give rx for clonidine zofran Discussed with Dr. Denton Lank who will dispo after urinalysis and uds    Amount and/or Complexity of Data Reviewed Labs: ordered. Decision-making details documented in ED Course. ECG/medicine tests: ordered and independent interpretation performed. Decision-making details documented in ED Course.  Risk Prescription drug management. Decision regarding hospitalization.           Final Clinical Impression(s) / ED Diagnoses Final diagnoses:  Polysubstance abuse (HCC)  Volume depletion  AKI (acute kidney injury) (HCC)  Hypokalemia    Rx / DC Orders ED Discharge  Orders     None         Margarita Grizzle, MD 03/30/22 1248    Margarita Grizzle, MD 03/30/22 1249    Margarita Grizzle, MD 03/30/22 [phone]

## 2022-03-30 NOTE — BH Assessment (Signed)
@  1718, requested patient's nurse (Paden, RN) to move the TTS machine to patient's room to complete her initial TTS assessment.

## 2022-03-30 NOTE — ED Notes (Signed)
Patient reports she does not need to urinate, bladder scan reveals patient has 34mL in bladder. Per ED provider, fluids started and patient provided food and drink.

## 2022-03-30 NOTE — ED Notes (Signed)
Pt current laying in bed soaked in her urine. This tech provided pt with some clean scrub bottoms, a brief, and towels to go in bathroom to clean herself up. Bed linen strip, and bed cleaned with bleach wipes. Clean linen placed on bed with bed pad in placed. Pt back from bathroom and informed by this tech that when she has to use the restroom, she needs to go across the hall to use it inside of going on herself. Pt stated ok. Pt provided with warm blanket, and is resting in bed at this time.

## 2022-03-30 NOTE — Discharge Instructions (Addendum)
Please drink plenty of fluids/stay well hydrated.   Take clonidine as needed for withdrawal symptoms over the next 5 days. Please take the potassium as prescribed, and make sure to eat plenty of fruits and vegetables.   Regarding elevated kidney test and low potassium,follow up closely with primary care doctor for recheck of labs in the next 2 to 3 days. Also have your blood pressure rechecked then, as it is high today.  Drug/substance use is harmful to your physical health and mental well-being - see resource guide attached in terms of substance use treatment programs in the area - contact them today or tomorrow AM.   For mental health issues and/or crisis, you may also go directly to the [facility]  - it is open 24/7 and walk-ins are welcome.  Return to ER right away if worse, new symptoms, fevers, chest pain, trouble breathing, persistent vomiting, or other concern.

## 2022-04-01 ENCOUNTER — Other Ambulatory Visit (HOSPITAL_COMMUNITY)
Admission: EM | Admit: 2022-04-01 | Discharge: 2022-04-06 | Disposition: A | Discharge status: Home / Self Care | Payer: Commercial Managed Care - HMO | Attending: Psychiatry | Admitting: Psychiatry

## 2022-04-01 DIAGNOSIS — F1120 Opioid dependence, uncomplicated: Secondary | ICD-10-CM | POA: Diagnosis present

## 2022-04-01 DIAGNOSIS — F1990 Other psychoactive substance use, unspecified, uncomplicated: Secondary | ICD-10-CM | POA: Diagnosis not present

## 2022-04-01 DIAGNOSIS — F319 Bipolar disorder, unspecified: Secondary | ICD-10-CM | POA: Diagnosis present

## 2022-04-01 DIAGNOSIS — F1994 Other psychoactive substance use, unspecified with psychoactive substance-induced mood disorder: Secondary | ICD-10-CM

## 2022-04-01 DIAGNOSIS — Z9151 Personal history of suicidal behavior: Secondary | ICD-10-CM

## 2022-04-01 DIAGNOSIS — R45851 Suicidal ideations: Secondary | ICD-10-CM | POA: Diagnosis not present

## 2022-04-01 DIAGNOSIS — F1124 Opioid dependence with opioid-induced mood disorder: Secondary | ICD-10-CM | POA: Insufficient documentation

## 2022-04-01 DIAGNOSIS — F1123 Opioid dependence with withdrawal: Secondary | ICD-10-CM | POA: Diagnosis not present

## 2022-04-01 DIAGNOSIS — Z20822 Contact with and (suspected) exposure to covid-19: Secondary | ICD-10-CM | POA: Diagnosis not present

## 2022-04-01 DIAGNOSIS — B9689 Other specified bacterial agents as the cause of diseases classified elsewhere: Secondary | ICD-10-CM | POA: Diagnosis not present

## 2022-04-01 DIAGNOSIS — F4310 Post-traumatic stress disorder, unspecified: Secondary | ICD-10-CM

## 2022-04-01 DIAGNOSIS — F1220 Cannabis dependence, uncomplicated: Secondary | ICD-10-CM

## 2022-04-01 MED ORDER — NAPROXEN 500 MG PO TABS
500.0000 mg | ORAL_TABLET | Freq: Two times a day (BID) | ORAL | Status: DC | PRN
  Administered 2022-04-02 – 2022-04-03 (×2): 500 mg via ORAL
  Filled 2022-04-01 (×2): qty 1

## 2022-04-01 MED ORDER — LOPERAMIDE HCL 2 MG PO CAPS: 2.0000 mg | ORAL_CAPSULE | ORAL | Status: DC | PRN

## 2022-04-01 MED ORDER — CLONIDINE HCL 0.1 MG PO TABS
0.1000 mg | ORAL_TABLET | Freq: Four times a day (QID) | ORAL | Status: AC
  Administered 2022-04-02 – 2022-04-04 (×8): 0.1 mg via ORAL
  Filled 2022-04-01 (×11): qty 1

## 2022-04-01 MED ORDER — ALUM & MAG HYDROXIDE-SIMETH 200-200-20 MG/5ML PO SUSP: 30.0000 mL | ORAL | Status: DC | PRN

## 2022-04-01 MED ORDER — HYDROXYZINE HCL 25 MG PO TABS
25.0000 mg | ORAL_TABLET | Freq: Four times a day (QID) | ORAL | Status: DC | PRN
  Administered 2022-04-02 – 2022-04-05 (×6): 25 mg via ORAL
  Filled 2022-04-01 (×7): qty 1

## 2022-04-01 MED ORDER — METHOCARBAMOL 500 MG PO TABS
500.0000 mg | ORAL_TABLET | Freq: Three times a day (TID) | ORAL | Status: DC | PRN
  Administered 2022-04-03: 500 mg via ORAL
  Filled 2022-04-01: qty 1

## 2022-04-01 MED ORDER — DICYCLOMINE HCL 20 MG PO TABS
20.0000 mg | ORAL_TABLET | Freq: Four times a day (QID) | ORAL | Status: DC | PRN
  Administered 2022-04-02 – 2022-04-03 (×2): 20 mg via ORAL
  Filled 2022-04-01 (×2): qty 1

## 2022-04-01 MED ORDER — CLONIDINE HCL 0.1 MG PO TABS: 0.1000 mg | ORAL_TABLET | Freq: Every day | ORAL | Status: DC

## 2022-04-01 MED ORDER — CLONIDINE HCL 0.1 MG PO TABS
0.1000 mg | ORAL_TABLET | ORAL | Status: DC
  Administered 2022-04-01 – 2022-04-05 (×2): 0.1 mg via ORAL
  Filled 2022-04-01 (×2): qty 1

## 2022-04-01 MED ORDER — MAGNESIUM HYDROXIDE 400 MG/5ML PO SUSP: 30.0000 mL | Freq: Every day | ORAL | Status: DC | PRN

## 2022-04-01 MED ORDER — TRAZODONE HCL 50 MG PO TABS: 50.0000 mg | ORAL_TABLET | Freq: Every evening | ORAL | Status: DC | PRN

## 2022-04-01 MED ORDER — ONDANSETRON 4 MG PO TBDP: 4.0000 mg | ORAL_TABLET | Freq: Four times a day (QID) | ORAL | Status: DC | PRN
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
## 2022-04-01 NOTE — Progress Notes (Signed)
   04/01/22 2150  Patient Reported Information  How Did You Hear About [place]? Self  What Is the Reason for Your Visit/Call Today? Pt reports, for about two weeks she's suffering, she's paranoid, anxious, depressed, has an opioid dependency. Pt reports, loosing her sons after birth in 65 and 2019. Pt reports, her husband was killed nine years ago. Pt reports, she wants to die, if discharged she may hurt herself or hurt other people, flip out.  How Long Has This Been Causing You Problems? > than 6 months  What Do You Feel Would Help You the Most Today? Alcohol or Drug Use Treatment  Have You Recently Had Any Thoughts About Hurting Yourself? Yes  Are You Planning to Commit Suicide/Harm Yourself At This time? No  Have you Recently Had Thoughts About Hurting Someone [place]? No  Are You Planning To Harm Someone At This Time? No  Have You Used Any Alcohol or Drugs in the Past 24 Hours? Yes  What Did You Use and How Much? Pt reports, sniffing a gram of Fentanyl three days ago. Pt reports, she was prescribed Xanax but since her psychiatrist lost his license she hasn't had it.  Do You Currently Have a Therapist/Psychiatrist? No  CCA Screening Triage Referral Assessment  Type of Contact Face-to-Face  Location of Assessment [facility] Assessment Services  Provider location [facility] Assessment Services  Is CPS involved or ever been involved? Never  Is APS involved or ever been involved? Never  Patient Determined To Be At Risk for Harm To Self or Others Based on Review of Patient Reported Information or Presenting Complaint? Yes, for Self-Harm  Does Patient Present under Involuntary Commitment? No  [place] of Residence [place]  Patient Currently Receiving the Following Services: Not Receiving Services  Determination of Need Urgent (48 hours)  Options For Referral Facility-Based Crisis;BH Urgent Care;Medication Management;Outpatient Therapy;Inpatient Hospitalization    Determination of need: Urgent.      Redmond Pulling, MS, [facility], [facility] Triage Specialist [phone]

## 2022-04-02 DIAGNOSIS — F1123 Opioid dependence with withdrawal: Secondary | ICD-10-CM | POA: Diagnosis not present

## 2022-04-02 DIAGNOSIS — R45851 Suicidal ideations: Secondary | ICD-10-CM | POA: Diagnosis not present

## 2022-04-02 DIAGNOSIS — F1124 Opioid dependence with opioid-induced mood disorder: Secondary | ICD-10-CM | POA: Diagnosis not present

## 2022-04-02 DIAGNOSIS — F1990 Other psychoactive substance use, unspecified, uncomplicated: Secondary | ICD-10-CM | POA: Diagnosis not present

## 2022-04-02 LAB — COMPREHENSIVE METABOLIC PANEL
ALT: 26 U/L (ref 0–44)
AST: 27 U/L (ref 15–41)
Albumin: 4 g/dL (ref 3.5–5.0)
Alkaline Phosphatase: 35 U/L — ABNORMAL LOW (ref 38–126)
Anion gap: 8 (ref 5–15)
BUN: 13 mg/dL (ref 6–20)
CO2: 27 mmol/L (ref 22–32)
Calcium: 9 mg/dL (ref 8.9–10.3)
Chloride: 104 mmol/L (ref 98–111)
Creatinine, Ser: 1.22 mg/dL — ABNORMAL HIGH (ref 0.44–1.00)
GFR, Estimated: 60 mL/min — ABNORMAL LOW (ref >60)
Glucose, Bld: 128 mg/dL — ABNORMAL HIGH (ref 70–99)
Potassium: 3.5 mmol/L (ref 3.5–5.1)
Sodium: 139 mmol/L (ref 135–145)
Total Bilirubin: 0.6 mg/dL (ref 0.3–1.2)
Total Protein: 7.1 g/dL (ref 6.5–8.1)

## 2022-04-02 LAB — HEMOGLOBIN A1C
Hgb A1c MFr Bld: 5.4 % (ref 4.8–5.6)
Mean Plasma Glucose: 108.28 mg/dL

## 2022-04-02 LAB — POCT PREGNANCY, URINE: Preg Test, Ur: NEGATIVE

## 2022-04-02 LAB — LIPID PANEL
Cholesterol: 142 mg/dL (ref 0–200)
HDL: 48 mg/dL (ref >40)
LDL Cholesterol: 81 mg/dL (ref 0–99)
Total CHOL/HDL Ratio: 3 RATIO
Triglycerides: 63 mg/dL (ref <150)
VLDL: 13 mg/dL (ref 0–40)

## 2022-04-02 LAB — CBC WITH DIFFERENTIAL/PLATELET
Abs Immature Granulocytes: 0.01 10*3/uL (ref 0.00–0.07)
Basophils Absolute: 0 10*3/uL (ref 0.0–0.1)
Basophils Relative: 0 %
Eosinophils Absolute: 0 10*3/uL (ref 0.0–0.5)
Eosinophils Relative: 0 %
HCT: 43.7 % (ref 36.0–46.0)
Hemoglobin: 15 g/dL (ref 12.0–15.0)
Immature Granulocytes: 0 %
Lymphocytes Relative: 51 %
Lymphs Abs: 2.6 10*3/uL (ref 0.7–4.0)
MCH: 29.5 pg (ref 26.0–34.0)
MCHC: 34.3 g/dL (ref 30.0–36.0)
MCV: 86 fL (ref 80.0–100.0)
Monocytes Absolute: 0.3 10*3/uL (ref 0.1–1.0)
Monocytes Relative: 5 %
Neutro Abs: 2.2 10*3/uL (ref 1.7–7.7)
Neutrophils Relative %: 44 %
Platelets: 296 10*3/uL (ref 150–400)
RBC: 5.08 MIL/uL (ref 3.87–5.11)
RDW: 15.8 % — ABNORMAL HIGH (ref 11.5–15.5)
WBC: 5.2 10*3/uL (ref 4.0–10.5)
nRBC: 0 % (ref 0.0–0.2)

## 2022-04-02 LAB — POCT URINE DRUG SCREEN - MANUAL ENTRY (I-SCREEN)
POC Amphetamine UR: NOT DETECTED
POC Buprenorphine (BUP): NOT DETECTED
POC Cocaine UR: NOT DETECTED
POC Marijuana UR: POSITIVE — AB
POC Methadone UR: NOT DETECTED
POC Methamphetamine UR: NOT DETECTED
POC Morphine: NOT DETECTED
POC Oxazepam (BZO): NOT DETECTED
POC Oxycodone UR: NOT DETECTED
POC Secobarbital (BAR): NOT DETECTED

## 2022-04-02 LAB — RESP PANEL BY RT-PCR (FLU A&B, COVID) ARPGX2
Influenza A by PCR: NEGATIVE
Influenza B by PCR: NEGATIVE
SARS Coronavirus 2 by RT PCR: NEGATIVE

## 2022-04-02 LAB — TSH: TSH: 0.503 u[IU]/mL (ref 0.350–4.500)

## 2022-04-02 LAB — ETHANOL: Alcohol, Ethyl (B): <10 mg/dL (ref <10)

## 2022-04-02 MED ORDER — QUETIAPINE FUMARATE 50 MG PO TABS
50.0000 mg | ORAL_TABLET | Freq: Every day | ORAL | Status: DC
  Administered 2022-04-02: 50 mg via ORAL
  Filled 2022-04-02: qty 1

## 2022-04-02 NOTE — ED Notes (Signed)
Pt was awakened with verbal prompt from OBS and walked with steady gait to [facility]. Pt was searched for contraband with none found and she was oriented to milieu and room.  Pt denies SI,HI or AVH at this time.  She declined going to the group and instead asked to sleep until lunch.  No further distress noted and  Q15 min checks initiated.  Staff will monitor for safety.

## 2022-04-02 NOTE — ED Notes (Signed)
Pt is awake and sitting in dayroom at this time eating lunch  

## 2022-04-02 NOTE — ED Notes (Addendum)
RN went to assessment room to complete [facility] admission paperwork. Pt reports SI with no plan, stating she "just wants to die." Pt also reports HI towards "a lot of people" with no plan. Pt reports paranoia of "thinking someone is going to kill me" for the past 2 weeks and auditory hallucinations "saying don't stand too close to her when someone shoots at her." Pt states she can "flip" very quickly and becomes "aggressive out of the blue." Theda Belfast, RN/AC and Cecilio Asper, NP notified. Per [facility], pt will stay in obs overnight and can come to [facility] tomorrow if she doesn't become aggressive tonight.

## 2022-04-02 NOTE — ED Notes (Signed)
Pt asleep in bed. Respirations even and unlabored. Monitoring for safety. 

## 2022-04-02 NOTE — ED Notes (Signed)
Pt accepted scheduled meds w/o difficulty. Currently resting on the pull out bed/chair in no distress. Safety maintained and will continue to monitor.

## 2022-04-02 NOTE — ED Notes (Signed)
Pt resting in bed. A&O x4, calm and cooperative. Denies SI/HI/AVH. No signs of distress noted. Monitoring for safety. 

## 2022-04-02 NOTE — ED Notes (Signed)
Pt asleep in bed. Respirations even and unlabored with no signs of acute distress noted. Will continue to monitor for safety. 

## 2022-04-02 NOTE — ED Provider Notes (Addendum)
Facility Based Crisis Admission H&P  Date: 04/02/22 Patient Name: Jessica Walton MRN: [id number] Chief Complaint:  Chief Complaint  Patient presents with   Addiction Problem      Diagnoses:  Final diagnoses:  Substance induced mood disorder (HCC)  Opioid dependence with withdrawal (HCC)    HPI: Jessica Walton is a 36 year old female with reported past psychiatric history of polysubstance use disorder, bipolar, depression, and anxiety. Patient presented voluntarily to [facility] on 04/01/22 requesting detox from fentanyl and treatment for worsening depressive symptoms. Patient was admitted to the [facility] continuous assessment unit and recommended for [facility].   Today, patient seen and evaluated face-to-face by this provider, and chart reviewed. On evaluation, patient is alert and oriented x 4. Her thought process is logical and relevant. Her speech is clear and coherent at a decreased tone. Her mood is irritable, depressed, and anxious. Her affect is appropriate.  Patient denies suicidal ideations. She reports multiple suicide attempts over the years by attempting to cut her wrists or take pills. She denies self-injurious behaviors. She denies homicidal ideations. She denies auditory and visual hallucinations. There is no objective evidence that the patient is currently responding to internal or external stimuli.  She states that she still feels irritated today. She reports feeling irritated for the past week. She reports worsening depressive symptoms for the past week that she describes as feelings of sadness, hopelessness, worthlessness, and isolating. She reports experiencing episodes of depression since 2013-11-12 when her baby died at birth and a month later her husband was killed. She states that she also loss another child in 11/12/2017. She states that she never received grief counseling because she was too busy trying to get her 3 kids counseling at that time. She reports difficulty sleeping at night  due to nightmares and racing thoughts for a long time. She reports a normal appetite. She reports lots of anxiety that she rates 10/10 with 10 being the worst. She describes her anxiety as worrying, and anxious.  She reports opiate withdrawal symptoms sweats, headaches, runny nose, weird dreams, and nightmares. She reports using fentanyl for the past 4 years, on average every day. She denies currently taking prescribed medications. She denies outpatient psychiatric services. She reports a medical history of asthma.   PHQ 2-9:  Flowsheet Row ED from 04/01/2022 in [facility]  Thoughts that you would be better off dead, or of hurting yourself in some way Nearly every day  PHQ-9 Total Score 18       Flowsheet Row ED from 04/01/2022 in [facility] ED from 03/30/2022 in [facility] EMERGENCY DEPARTMENT ED from 03/16/2021 in [facility] RISK CATEGORY No Risk No Risk No Risk        Total Time spent with patient: 30 minutes  Musculoskeletal  Strength & Muscle Tone: within normal limits Gait & Station: normal Patient leans: N/A  Psychiatric Specialty Exam  Presentation General Appearance: Appropriate for Environment  Eye Contact:Fair  Speech:Clear and Coherent  Speech Volume:Normal  Handedness:Right   Mood and Affect  Mood:Irritable; Depressed; Anxious  Affect:Appropriate   Thought Process  Thought Processes:Coherent  Descriptions of Associations:Intact  Orientation:Full (Time, Place and Person)  Thought Content:Logical    Hallucinations:Hallucinations: None  Ideas of Reference:None  Suicidal Thoughts:Suicidal Thoughts: No SI Passive Intent and/or Plan: Without Plan; Without Intent  Homicidal Thoughts:Homicidal Thoughts: No   Sensorium  Memory:Immediate Fair; Recent Fair; Remote Fair  Judgment:Fair  Insight:Fair   Executive Functions   Concentration:Fair  Attention Span:Fair  Recall:Fair  Fund of Knowledge:Fair  Language:Fair   Psychomotor Activity  Psychomotor Activity:Psychomotor Activity: Normal   Assets  Assets:Communication Skills; Desire for Improvement; Leisure Time; Physical Health   Sleep  Sleep:Sleep: Poor Number of Hours of Sleep: 0   Nutritional Assessment (For OBS and FBC admissions only) Has the patient had a weight loss or gain of 10 pounds or more in the last 3 months?: No Has the patient had a decrease in food intake/or appetite?: No Does the patient have dental problems?: No Does the patient have eating habits or behaviors that may be indicators of an eating disorder including binging or inducing vomiting?: No Has the patient recently lost weight without trying?: 0 Has the patient been eating poorly because of a decreased appetite?: 0 Malnutrition Screening Tool Score: 0    Physical Exam HENT:     Head: Normocephalic.     Nose: Nose normal.  Eyes:     Conjunctiva/sclera: Conjunctivae normal.  Cardiovascular:     Rate and Rhythm: Normal rate.  Pulmonary:     Effort: Pulmonary effort is normal.  Musculoskeletal:        General: Normal range of motion.     Cervical back: Normal range of motion.  Neurological:     Mental Status: She is alert and oriented to person, place, and time.    Review of Systems  Constitutional: Negative.   HENT: Negative.    Eyes: Negative.   Respiratory: Negative.    Cardiovascular: Negative.   Gastrointestinal: Negative.   Genitourinary: Negative.   Musculoskeletal: Negative.   Neurological: Negative.   Endo/Heme/Allergies: Negative.   Psychiatric/Behavioral:  Positive for depression and substance abuse. The patient has insomnia.     Blood pressure 115/89, pulse 81, temperature 99.7 F (37.6 C), temperature source Oral, resp. rate 18, last menstrual period 03/07/2022, SpO2 100 %. There is no height or weight on file to calculate  BMI.  Past Psychiatric History: Past psychiatric history of polysubstance use disorder, bipolar, depression, and  anxiety. Reports past manic episodes. Denies past inpatient psychiatric treatments. Past psychotropics includes "Topamax and alprazolam."   Family history: Reports Mother has bipolar, manic.  Is the patient at risk to self? Yes  Has the patient been a risk to self in the past 6 months? No .    Has the patient been a risk to self within the distant past? Yes   Is the patient a risk to others? No   Has the patient been a risk to others in the past 6 months? No   Has the patient been a risk to others within the distant past? No   Past Medical History:  Past Medical History:  Diagnosis Date   Asthma    No inhaler use x 1 yr (2013)   Bipolar 1 disorder (HCC)    Cocaine abuse (HCC) 2018   Headache    History of suicide attempt 2015   Hypertension    Lordosis    Opiate abuse (HCC) 2018   PTSD (post-traumatic stress disorder)    Seizures (HCC)    Tetrahydrocannabinol (THC) dependence (HCC)     Past Surgical History:  Procedure Laterality Date   EYE SURGERY     WISDOM TOOTH EXTRACTION     x4    Family History:  Family History  Problem Relation Age of Onset   Hypertension Maternal Grandmother    Diabetes Maternal Grandmother  Seizures Neg Hx     Social History:  Social History   Socioeconomic History   Marital status: Single    Spouse name: Not on file   Number of children: Not on file   Years of education: 14   Highest education level: Not on file  Occupational History   Not on file  Tobacco Use   Smoking status: Some Days    Packs/day: 0.50    Years: 10.00    Total pack years: 5.00    Types: Cigarettes   Smokeless tobacco: Never  Substance and Sexual Activity   Alcohol use: Yes    Alcohol/week: 0.0 standard drinks of alcohol    Comment: occasional   Drug use: No   Sexual activity: Yes  Other Topics Concern   Not on file  Social History  Narrative   Lives at home with family.   Caffeine use: none   Social Determinants of Health   Financial Resource Strain: Not on file  Food Insecurity: Not on file  Transportation Needs: Not on file  Physical Activity: Not on file  Stress: Not on file  Social Connections: Not on file  Intimate Partner Violence: Not on file    SDOH:  SDOH Screenings   Depression (PHQ2-9): High Risk (04/02/2022)  Tobacco Use: High Risk (03/30/2022)    Last Labs:  Admission on 04/01/2022  Component Date Value Ref Range Status   SARS Coronavirus 2 by RT PCR 04/02/2022 NEGATIVE  NEGATIVE Final   Comment: (NOTE) SARS-CoV-2 target nucleic acids are NOT DETECTED.  The SARS-CoV-2 RNA is generally detectable in upper respiratory specimens during the acute phase of infection. The lowest concentration of SARS-CoV-2 viral copies this assay can detect is 138 copies/mL. A negative result does not preclude SARS-Cov-2 infection and should not be used as the sole basis for treatment or other patient management decisions. A negative result may occur with  improper specimen collection/handling, submission of specimen other than nasopharyngeal swab, presence of viral mutation(s) within the areas targeted by this assay, and inadequate number of viral copies(<138 copies/mL). A negative result must be combined with clinical observations, patient history, and epidemiological information. The expected result is Negative.  Fact Sheet for Patients:  BloggerCourse.com  Fact Sheet for Healthcare Providers:  SeriousBroker.it  This test is no                          t yet approved or cleared by the [place] FDA and  has been authorized for detection and/or diagnosis of SARS-CoV-2 by FDA under an Emergency Use Authorization (EUA). This EUA will remain  in effect (meaning this test can be used) for the duration of the COVID-19 declaration under Section 564(b)(1) of  the Act, 21 U.S.C.section 360bbb-3(b)(1), unless the authorization is terminated  or revoked sooner.       Influenza A by PCR 04/02/2022 NEGATIVE  NEGATIVE Final   Influenza B by PCR 04/02/2022 NEGATIVE  NEGATIVE Final   Comment: (NOTE) The Xpert Xpress SARS-CoV-2/FLU/RSV plus assay is intended as an aid in the diagnosis of influenza from Nasopharyngeal swab specimens and should not be used as a sole basis for treatment. Nasal washings and aspirates are unacceptable for Xpert Xpress SARS-CoV-2/FLU/RSV testing.  Fact Sheet for Patients: BloggerCourse.com  Fact Sheet for Healthcare Providers: SeriousBroker.it  This test is not yet approved or cleared by the [place] FDA and has been authorized for detection and/or diagnosis of SARS-CoV-2 by FDA under an  Emergency Use Authorization (EUA). This EUA will remain in effect (meaning this test can be used) for the duration of the COVID-19 declaration under Section 564(b)(1) of the Act, 21 U.S.C. section 360bbb-3(b)(1), unless the authorization is terminated or revoked.  Performed at [facility], [address] [place], [place] [address]    WBC 04/02/2022 5.2  4.0 - 10.5 K/uL Final   RBC 04/02/2022 5.08  3.87 - 5.11 MIL/uL Final   Hemoglobin 04/02/2022 15.0  12.0 - 15.0 g/dL Final   HCT 62/56/3893 43.7  36.0 - 46.0 % Final   MCV 04/02/2022 86.0  80.0 - 100.0 fL Final   MCH 04/02/2022 29.5  26.0 - 34.0 pg Final   MCHC 04/02/2022 34.3  30.0 - 36.0 g/dL Final   RDW 73/42/8768 15.8 (H)  11.5 - 15.5 % Final   Platelets 04/02/2022 296  150 - 400 K/uL Final   nRBC 04/02/2022 0.0  0.0 - 0.2 % Final   Neutrophils Relative % 04/02/2022 44  % Final   Neutro Abs 04/02/2022 2.2  1.7 - 7.7 K/uL Final   Lymphocytes Relative 04/02/2022 51  % Final   Lymphs Abs 04/02/2022 2.6  0.7 - 4.0 K/uL Final   Monocytes Relative 04/02/2022 5  % Final   Monocytes Absolute 04/02/2022 0.3  0.1 - 1.0  K/uL Final   Eosinophils Relative 04/02/2022 0  % Final   Eosinophils Absolute 04/02/2022 0.0  0.0 - 0.5 K/uL Final   Basophils Relative 04/02/2022 0  % Final   Basophils Absolute 04/02/2022 0.0  0.0 - 0.1 K/uL Final   Immature Granulocytes 04/02/2022 0  % Final   Abs Immature Granulocytes 04/02/2022 0.01  0.00 - 0.07 K/uL Final   Performed at [facility], [address], [place], [place] [address]   Sodium 04/02/2022 139  135 - 145 mmol/L Final   Potassium 04/02/2022 3.5  3.5 - 5.1 mmol/L Final   Chloride 04/02/2022 104  98 - 111 mmol/L Final   CO2 04/02/2022 27  22 - 32 mmol/L Final   Glucose, Bld 04/02/2022 128 (H)  70 - 99 mg/dL Final   Glucose reference range applies only to samples taken after fasting for at least 8 hours.   BUN 04/02/2022 13  6 - 20 mg/dL Final   Creatinine, Ser 04/02/2022 1.22 (H)  0.44 - 1.00 mg/dL Final   Calcium 62/09/5595 9.0  8.9 - 10.3 mg/dL Final   Total Protein 41/63/8453 7.1  6.5 - 8.1 g/dL Final   Albumin 64/68/0321 4.0  3.5 - 5.0 g/dL Final   AST 22/48/2500 27  15 - 41 U/L Final   ALT 04/02/2022 26  0 - 44 U/L Final   Alkaline Phosphatase 04/02/2022 35 (L)  38 - 126 U/L Final   Total Bilirubin 04/02/2022 0.6  0.3 - 1.2 mg/dL Final   GFR, Estimated 04/02/2022 60 (L)  >60 mL/min Final   Comment: (NOTE) Calculated using the CKD-EPI Creatinine Equation (2021)    Anion gap 04/02/2022 8  5 - 15 Final   Performed at [facility], [address], [place], [place] [address]   Hgb A1c MFr Bld 04/02/2022 5.4  4.8 - 5.6 % Final   Comment: (NOTE) Pre diabetes:          5.7%-6.4%  Diabetes:              >6.4%  Glycemic control for   <7.0% adults with diabetes    Mean Plasma Glucose  04/02/2022 108.28  mg/dL Final   Performed at [facility], [address], [place], [place] [address]   Alcohol, Ethyl (B) 04/02/2022 <10  <10 mg/dL Final   Comment: (NOTE) Lowest detectable limit for serum alcohol is 10 mg/dL.  For medical purposes  only. Performed at [facility], [address], [place], [place] [address]    Cholesterol 04/02/2022 142  0 - 200 mg/dL Final   Triglycerides 86/57/8469 63  <150 mg/dL Final   HDL 62/95/2841 48  >40 mg/dL Final   Total CHOL/HDL Ratio 04/02/2022 3.0  RATIO Final   VLDL 04/02/2022 13  0 - 40 mg/dL Final   LDL Cholesterol 04/02/2022 81  0 - 99 mg/dL Final   Comment:        Total Cholesterol/HDL:CHD Risk Coronary Heart Disease Risk Table                     Men   Women  1/2 Average Risk   3.4   3.3  Average Risk       5.0   4.4  2 X Average Risk   9.6   7.1  3 X Average Risk  23.4   11.0        Use the calculated Patient Ratio above and the CHD Risk Table to determine the patient's CHD Risk.        ATP III CLASSIFICATION (LDL):  <100     mg/dL   Optimal  324-401  mg/dL   Near or Above                    Optimal  130-159  mg/dL   Borderline  027-253  mg/dL   High  >664     mg/dL   Very High Performed at [facility], [address] [place], [place] [address]    TSH 04/02/2022 0.503  0.350 - 4.500 uIU/mL Final   Comment: Performed by a 3rd Generation assay with a functional sensitivity of <=0.01 uIU/mL. Performed at [facility], [address], [place], [place] [address]    Preg Test, Ur 04/02/2022 NEGATIVE  NEGATIVE Final   Comment:        THE SENSITIVITY OF THIS METHODOLOGY IS >24 mIU/mL   Admission on 03/30/2022, Discharged on 03/30/2022  Component Date Value Ref Range Status   Sodium 03/30/2022 140  135 - 145 mmol/L Final   Potassium 03/30/2022 3.1 (L)  3.5 - 5.1 mmol/L Final   Chloride 03/30/2022 98  98 - 111 mmol/L Final   CO2 03/30/2022 24  22 - 32 mmol/L Final   Glucose, Bld 03/30/2022 140 (H)  70 - 99 mg/dL Final   Glucose reference range applies only to samples taken after fasting for at least 8 hours.   BUN 03/30/2022 29 (H)  6 - 20 mg/dL Final   Creatinine, Ser 03/30/2022 1.60 (H)  0.44 - 1.00 mg/dL Final   Calcium 63/87/5643 10.3  8.9 -  10.3 mg/dL Final   Total Protein 32/95/1884 10.0 (H)  6.5 - 8.1 g/dL Final   Albumin 16/60/6301 5.3 (H)  3.5 - 5.0 g/dL Final   AST 60/04/9322 27  15 - 41 U/L Final   ALT 03/30/2022 19  0 - 44 U/L Final   Alkaline Phosphatase 03/30/2022 46  38 - 126 U/L Final   Total Bilirubin 03/30/2022 0.7  0.3 - 1.2 mg/dL Final   GFR, Estimated 03/30/2022 43 (L)  >  60 mL/min Final   Comment: (NOTE) Calculated using the CKD-EPI Creatinine Equation (2021)    Anion gap 03/30/2022 18 (H)  5 - 15 Final   Performed at [facility], [address] [place], [place] [address]   Lipase 03/30/2022 44  11 - 51 U/L Final   Performed at [facility], [address] [place], [place] [address]   WBC 03/30/2022 11.3 (H)  4.0 - 10.5 K/uL Final   RBC 03/30/2022 5.98 (H)  3.87 - 5.11 MIL/uL Final   Hemoglobin 03/30/2022 17.8 (H)  12.0 - 15.0 g/dL Final   HCT 40/97/3532 50.9 (H)  36.0 - 46.0 % Final   MCV 03/30/2022 85.1  80.0 - 100.0 fL Final   MCH 03/30/2022 29.8  26.0 - 34.0 pg Final   MCHC 03/30/2022 35.0  30.0 - 36.0 g/dL Final   RDW 99/24/2683 16.3 (H)  11.5 - 15.5 % Final   Platelets 03/30/2022 414 (H)  150 - 400 K/uL Final   nRBC 03/30/2022 0.0  0.0 - 0.2 % Final   Neutrophils Relative % 03/30/2022 82  % Final   Neutro Abs 03/30/2022 9.2 (H)  1.7 - 7.7 K/uL Final   Lymphocytes Relative 03/30/2022 13  % Final   Lymphs Abs 03/30/2022 1.5  0.7 - 4.0 K/uL Final   Monocytes Relative 03/30/2022 5  % Final   Monocytes Absolute 03/30/2022 0.6  0.1 - 1.0 K/uL Final   Eosinophils Relative 03/30/2022 0  % Final   Eosinophils Absolute 03/30/2022 0.0  0.0 - 0.5 K/uL Final   Basophils Relative 03/30/2022 0  % Final   Basophils Absolute 03/30/2022 0.0  0.0 - 0.1 K/uL Final   Immature Granulocytes 03/30/2022 0  % Final   Abs Immature Granulocytes 03/30/2022 0.05  0.00 - 0.07 K/uL Final   Performed at [facility], [address] [place], [place] [address]   Alcohol, Ethyl (B) 03/30/2022 <10  <10 mg/dL  Final   Comment: (NOTE) Lowest detectable limit for serum alcohol is 10 mg/dL.  For medical purposes only. Performed at [facility], [address], [place], [place] [address]    I-stat hCG, quantitative 03/30/2022 <5.0  <5 mIU/mL Final   Comment 3 03/30/2022          Final   Comment:   GEST. AGE      CONC.  (mIU/mL)   <=1 WEEK        5 - 50     2 WEEKS       50 - 500     3 WEEKS       100 - 10,000     4 WEEKS     1,000 - 30,000        FEMALE AND NON-PREGNANT FEMALE:     LESS THAN 5 mIU/mL    Sodium 03/30/2022 143  135 - 145 mmol/L Final   Potassium 03/30/2022 3.1 (L)  3.5 - 5.1 mmol/L Final   Chloride 03/30/2022 99  98 - 111 mmol/L Final   BUN 03/30/2022 37 (H)  6 - 20 mg/dL Final   Creatinine, Ser 03/30/2022 1.30 (H)  0.44 - 1.00 mg/dL Final   Glucose, Bld 89/21/1940 117 (H)  70 - 99 mg/dL Final   Glucose reference range applies only to samples taken after fasting for at least 8 hours.   Calcium, Ion 03/30/2022 1.08 (L)  1.15 - 1.40 mmol/L Final   TCO2 03/30/2022 32  22 -  32 mmol/L Final   Hemoglobin 03/30/2022 17.7 (H)  12.0 - 15.0 g/dL Final   HCT 16/10/960409/12/2021 52.0 (H)  36.0 - 46.0 % Final   SARS Coronavirus 2 by RT PCR 03/30/2022 NEGATIVE  NEGATIVE Final   Comment: (NOTE) SARS-CoV-2 target nucleic acids are NOT DETECTED.  The SARS-CoV-2 RNA is generally detectable in upper respiratory specimens during the acute phase of infection. The lowest concentration of SARS-CoV-2 viral copies this assay can detect is 138 copies/mL. A negative result does not preclude SARS-Cov-2 infection and should not be used as the sole basis for treatment or other patient management decisions. A negative result may occur with  improper specimen collection/handling, submission of specimen other than nasopharyngeal swab, presence of viral mutation(s) within the areas targeted by this assay, and inadequate number of viral copies(<138 copies/mL). A negative result must be combined  with clinical observations, patient history, and epidemiological information. The expected result is Negative.  Fact Sheet for Patients:  BloggerCourse.comhttps://www.fda.gov/media/152166/download  Fact Sheet for Healthcare Providers:  SeriousBroker.ithttps://www.fda.gov/media/152162/download  This test is no                          t yet approved or cleared by the [place] FDA and  has been authorized for detection and/or diagnosis of SARS-CoV-2 by FDA under an Emergency Use Authorization (EUA). This EUA will remain  in effect (meaning this test can be used) for the duration of the COVID-19 declaration under Section 564(b)(1) of the Act, 21 U.S.C.section 360bbb-3(b)(1), unless the authorization is terminated  or revoked sooner.       Influenza A by PCR 03/30/2022 NEGATIVE  NEGATIVE Final   Influenza B by PCR 03/30/2022 NEGATIVE  NEGATIVE Final   Comment: (NOTE) The Xpert Xpress SARS-CoV-2/FLU/RSV plus assay is intended as an aid in the diagnosis of influenza from Nasopharyngeal swab specimens and should not be used as a sole basis for treatment. Nasal washings and aspirates are unacceptable for Xpert Xpress SARS-CoV-2/FLU/RSV testing.  Fact Sheet for Patients: BloggerCourse.comhttps://www.fda.gov/media/152166/download  Fact Sheet for Healthcare Providers: SeriousBroker.ithttps://www.fda.gov/media/152162/download  This test is not yet approved or cleared by the [place] FDA and has been authorized for detection and/or diagnosis of SARS-CoV-2 by FDA under an Emergency Use Authorization (EUA). This EUA will remain in effect (meaning this test can be used) for the duration of the COVID-19 declaration under Section 564(b)(1) of the Act, 21 U.S.C. section 360bbb-3(b)(1), unless the authorization is terminated or revoked.  Performed at [facility], [address] [place], [place] [id number]     Allergies: Benadryl [diphenhydramine hcl] and Vicodin [hydrocodone-acetaminophen]  PTA Medications: (Not in a hospital  admission)   Long Term Goals: Improvement in symptoms so as ready for discharge  Short Term Goals: Patient will attend at least of 50% of the groups daily., Pt will complete the PHQ9 on admission, day 3 and discharge., and Patient will take medications as prescribed daily.  Medical Decision Making  [facility] for mood stabilization and opiate detox. Patient is voluntary.   Medications:  Continue clonidine 0.1 mg po  taper for OUD   Initiate Seroquel 50 mg po QHS for mood stabilization (pt reports hx of bipolar)    Labs: reviewed. Reordered UDS.   Recommendations  Based on my evaluation the patient does not appear to have an emergency medical condition.  Layla BarterWhite, Sibel Khurana L, NP 04/02/22  3:22 PM

## 2022-04-02 NOTE — ED Provider Notes (Cosign Needed Addendum)
[facility] Continuous Assessment Admission H&P  Date: 04/02/22 Patient Name: Jessica Walton MRN: [id number] Chief Complaint: No chief complaint on file.     Diagnoses:  Final diagnoses:  Polysubstance use disorder  Substance induced mood disorder (HCC)    HPI: Jessica Walton is a 36 year old female with reported history of polysubstance use disorder, depression, and anxiety.  Patient presented voluntarily to [facility] requesting detox from fentanyl and treatment for worsening depressive symptoms.    Patient was assessed by this nurse practitioner.  On assessment, patient is alert and oriented x 4.she is speaking in a normal tone of voice with good eye contact.  Patient's mood is depressed with congruent affect.  Her thought process is coherent.  She did not appear to be responding to any internal/external stimuli or experiencing any delusional thought content during this interaction.   Patient reports that she is "suffering emotionally."  She says she has been having suicidal thoughts for the past 1 to 2 weeks.  She denies any suicidal plan or intent to harm herself. Patient reports history of suicidal attempt via cutting her wrist in 2013/11/12. Patient reports that she is under a lot of stress and " I am tired and I want to die."  Patient identifies " death of husband (27yrs ago), death of children in 2013/11/12 & November 12, 2017, and substance abuse as her main stressors.   Patient reports that she has been using fentanyl for the past 4 years.  She reports that she snorts approximately 1 g of fentanyl daily.  Patient reports that she uses fentanyl to "self-medicate" because her psychiatrist stopped practicing and that she is unable to find another psychiatric provider. She says she is seeking inpatient treatment to assist with withdrawal symptom management. She reports experiencing body aches, sweats, anxiety, nausea, vomiting, chills during detox. She is reporting depressive symptoms of hopelessness, anxiety,  worthlessness, restlessness, poor sleep and irritability.  Patient denies homicidal ideation to this Probation officer however she admits to TTS counselor that "if discharged she may hurt herself or hurt other people, flip out." She denies hallucination.  She says she has history of chronic paranoia.  States she has been feeling paranoid about getting "shot in the head" since her husband was shot and killed.  PHQ 2-9:   [place] [facility] from 04/01/2022 in [facility] ED from 03/30/2022 in [place] [facility] from 03/16/2021 in [facility] [place] No Risk No Risk        Total Time spent with patient: 20 minutes  Musculoskeletal  Strength & Muscle Tone: within normal limits Gait & Station: normal Patient leans: Right  Psychiatric Specialty Exam  Presentation General Appearance: Appropriate for Environment  Eye Contact:Good  Speech:Clear and Coherent  Speech Volume:Normal  Handedness:Right   Mood and Affect  Mood:Depressed  Affect:Congruent   Thought Process  Thought Processes:Coherent  Descriptions of Associations:Intact  Orientation:Full (Time, Place and Person)  Thought Content:WDL    Hallucinations:Hallucinations: None  Ideas of Reference:None  Suicidal Thoughts:Suicidal Thoughts: Yes, Passive SI Passive Intent and/or Plan: Without Plan; Without Intent  Homicidal Thoughts:Homicidal Thoughts: No   Sensorium  Memory:Immediate Good; Recent Good; Remote Fair  Judgment:Fair  Insight:Good   Executive Functions  Concentration:Fair  Attention Span:Fair  [place] of Knowledge:Good  Language:Good   Psychomotor Activity  Psychomotor Activity:Psychomotor Activity: Normal   Assets  Assets:Communication Skills; Desire for Improvement; Physical Health  Sleep  Sleep:Sleep: Poor Number of Hours of Sleep: 1   Nutritional Assessment (For OBS  and FBC admissions only) Has the patient had a weight loss or gain of 10 pounds or more in the last 3 months?: No Has the patient had a decrease in food intake/or appetite?: No Does the patient have dental problems?: No Does the patient have eating habits or behaviors that may be indicators of an eating disorder including binging or inducing vomiting?: No Has the patient recently lost weight without trying?: 0 Has the patient been eating poorly because of a decreased appetite?: 0 Malnutrition Screening Tool Score: 0    Physical Exam Vitals and nursing note reviewed.  Constitutional:      General: She is not in acute distress.    Appearance: She is well-developed.  HENT:     Head: Normocephalic and atraumatic.  Eyes:     Conjunctiva/sclera: Conjunctivae normal.  Cardiovascular:     Rate and Rhythm: Normal rate.  Pulmonary:     Effort: Pulmonary effort is normal. No respiratory distress.     Breath sounds: Normal breath sounds.  Abdominal:     Palpations: Abdomen is soft.     Tenderness: There is no abdominal tenderness.  Musculoskeletal:        General: No swelling.     Cervical back: Neck supple.  Skin:    General: Skin is warm and dry.  Neurological:     Mental Status: She is alert and oriented to person, place, and time.  Psychiatric:        Attention and Perception: Attention and perception normal.        Mood and Affect: Mood is anxious and depressed.        Speech: Speech normal.        Behavior: Behavior normal. Behavior is cooperative.        Thought Content: Thought content is paranoid. Thought content is not delusional. Thought content includes homicidal and suicidal ideation. Thought content does not include homicidal or suicidal plan.    Review of Systems  Constitutional: Negative.   HENT: Negative.    Eyes: Negative.   Respiratory: Negative.    Cardiovascular: Negative.   Gastrointestinal: Negative.   Genitourinary: Negative.   Musculoskeletal: Negative.    Skin: Negative.   Neurological: Negative.   Endo/Heme/Allergies: Negative.   Psychiatric/Behavioral:  Positive for depression, hallucinations, substance abuse and suicidal ideas. The patient is nervous/anxious.     Blood pressure (!) 132/90, pulse 93, temperature 99.6 F (37.6 C), temperature source Oral, resp. rate 18, last menstrual period 03/07/2022, SpO2 100 %. There is no height or weight on file to calculate BMI.  Past Psychiatric History:    Is the patient at risk to self? Yes  Has the patient been a risk to self in the past 6 months? No .    Has the patient been a risk to self within the distant past? Yes   Is the patient a risk to others? Yes   Has the patient been a risk to others in the past 6 months? Yes   Has the patient been a risk to others within the distant past? Yes   Past Medical History:  Past Medical History:  Diagnosis Date   Asthma    No inhaler use x 1 yr (2013)   Bipolar 1 disorder ([place])    Cocaine abuse ([place]) 2018   Headache    History of suicide attempt 2015   Hypertension    Lordosis  Opiate abuse (HCC) 2018   PTSD (post-traumatic stress disorder)    Seizures (HCC)    Tetrahydrocannabinol (THC) dependence (HCC)     Past Surgical History:  Procedure Laterality Date   EYE SURGERY     WISDOM TOOTH EXTRACTION     x4    Family History:  Family History  Problem Relation Age of Onset   Hypertension Maternal Grandmother    Diabetes Maternal Grandmother    Seizures Neg Hx     Social History:  Social History   Socioeconomic History   Marital status: Single    Spouse name: Not on file   Number of children: Not on file   Years of education: 14   Highest education level: Not on file  Occupational History   Not on file  Tobacco Use   Smoking status: Some Days    Packs/day: 0.50    Years: 10.00    Total pack years: 5.00    Types: Cigarettes   Smokeless tobacco: Never  Substance and Sexual Activity   Alcohol use: Yes     Alcohol/week: 0.0 standard drinks of alcohol    Comment: occasional   Drug use: No   Sexual activity: Yes  Other Topics Concern   Not on file  Social History Narrative   Lives at home with family.   Caffeine use: none   Social Determinants of Health   Financial Resource Strain: Not on file  Food Insecurity: Not on file  Transportation Needs: Not on file  Physical Activity: Not on file  Stress: Not on file  Social Connections: Not on file  Intimate Partner Violence: Not on file    SDOH:  SDOH Screenings   Tobacco Use: High Risk (03/30/2022)    Last Labs:  Admission on 03/30/2022, Discharged on 03/30/2022  Component Date Value Ref Range Status   Sodium 03/30/2022 140  135 - 145 mmol/L Final   Potassium 03/30/2022 3.1 (L)  3.5 - 5.1 mmol/L Final   Chloride 03/30/2022 98  98 - 111 mmol/L Final   CO2 03/30/2022 24  22 - 32 mmol/L Final   Glucose, Bld 03/30/2022 140 (H)  70 - 99 mg/dL Final   Glucose reference range applies only to samples taken after fasting for at least 8 hours.   BUN 03/30/2022 29 (H)  6 - 20 mg/dL Final   Creatinine, Ser 03/30/2022 1.60 (H)  0.44 - 1.00 mg/dL Final   Calcium 08/02/3233 10.3  8.9 - 10.3 mg/dL Final   Total Protein 57/32/2025 10.0 (H)  6.5 - 8.1 g/dL Final   Albumin 42/70/6237 5.3 (H)  3.5 - 5.0 g/dL Final   AST 62/83/1517 27  15 - 41 U/L Final   ALT 03/30/2022 19  0 - 44 U/L Final   Alkaline Phosphatase 03/30/2022 46  38 - 126 U/L Final   Total Bilirubin 03/30/2022 0.7  0.3 - 1.2 mg/dL Final   GFR, Estimated 03/30/2022 43 (L)  >60 mL/min Final   Comment: (NOTE) Calculated using the CKD-EPI Creatinine Equation (2021)    Anion gap 03/30/2022 18 (H)  5 - 15 Final   Performed at [facility], [address] [place], [place] [address]   Lipase 03/30/2022 44  11 - 51 U/L Final   Performed at [facility], [address], [place], [place] [address]   WBC 03/30/2022 11.3 (H)  4.0 - 10.5 K/uL Final   RBC 03/30/2022 5.98 (H)  3.87  - 5.11 MIL/uL Final  Hemoglobin 03/30/2022 17.8 (H)  12.0 - 15.0 g/dL Final   HCT 03/30/2022 50.9 (H)  36.0 - 46.0 % Final   MCV 03/30/2022 85.1  80.0 - 100.0 fL Final   MCH 03/30/2022 29.8  26.0 - 34.0 pg Final   MCHC 03/30/2022 35.0  30.0 - 36.0 g/dL Final   RDW 03/30/2022 16.3 (H)  11.5 - 15.5 % Final   Platelets 03/30/2022 414 (H)  150 - 400 K/uL Final   nRBC 03/30/2022 0.0  0.0 - 0.2 % Final   Neutrophils Relative % 03/30/2022 82  % Final   Neutro Abs 03/30/2022 9.2 (H)  1.7 - 7.7 K/uL Final   Lymphocytes Relative 03/30/2022 13  % Final   Lymphs Abs 03/30/2022 1.5  0.7 - 4.0 K/uL Final   Monocytes Relative 03/30/2022 5  % Final   Monocytes Absolute 03/30/2022 0.6  0.1 - 1.0 K/uL Final   Eosinophils Relative 03/30/2022 0  % Final   Eosinophils Absolute 03/30/2022 0.0  0.0 - 0.5 K/uL Final   Basophils Relative 03/30/2022 0  % Final   Basophils Absolute 03/30/2022 0.0  0.0 - 0.1 K/uL Final   Immature Granulocytes 03/30/2022 0  % Final   Abs Immature Granulocytes 03/30/2022 0.05  0.00 - 0.07 K/uL Final   Performed at [facility], [place] [address] [place], [place] [address]   Alcohol, Ethyl (B) 03/30/2022 <10  <10 mg/dL Final   Comment: (NOTE) Lowest detectable limit for serum alcohol is 10 mg/dL.  For medical purposes only. Performed at [facility], [place] [address], [place], [place] [address]    I-stat hCG, quantitative 03/30/2022 <5.0  <5 mIU/mL Final   Comment 3 03/30/2022          Final   Comment:   GEST. AGE      CONC.  (mIU/mL)   <=1 WEEK        5 - 50     2 WEEKS       50 - 500     3 WEEKS       100 - 10,000     4 WEEKS     1,000 - 30,000        FEMALE AND NON-PREGNANT FEMALE:     LESS THAN 5 mIU/mL    Sodium 03/30/2022 143  135 - 145 mmol/L Final   Potassium 03/30/2022 3.1 (L)  3.5 - 5.1 mmol/L Final   Chloride 03/30/2022 99  98 - 111 mmol/L Final   BUN 03/30/2022 37 (H)  6 - 20 mg/dL Final   Creatinine, Ser 03/30/2022 1.30 (H)  0.44 - 1.00 mg/dL Final    Glucose, Bld 03/30/2022 117 (H)  70 - 99 mg/dL Final   Glucose reference range applies only to samples taken after fasting for at least 8 hours.   Calcium, Ion 03/30/2022 1.08 (L)  1.15 - 1.40 mmol/L Final   TCO2 03/30/2022 32  22 - 32 mmol/L Final   Hemoglobin 03/30/2022 17.7 (H)  12.0 - 15.0 g/dL Final   HCT 03/30/2022 52.0 (H)  36.0 - 46.0 % Final   SARS Coronavirus 2 by RT PCR 03/30/2022 NEGATIVE  NEGATIVE Final   Comment: (NOTE) SARS-CoV-2 target nucleic acids are NOT DETECTED.  The SARS-CoV-2 RNA is generally detectable in upper respiratory specimens during the acute phase of infection. The lowest concentration of SARS-CoV-2 viral copies this assay can detect is 138 copies/mL. A negative result does not preclude SARS-Cov-2 infection and should not be  used as the sole basis for treatment or other patient management decisions. A negative result may occur with  improper specimen collection/handling, submission of specimen other than nasopharyngeal swab, presence of viral mutation(s) within the areas targeted by this assay, and inadequate number of viral copies(<138 copies/mL). A negative result must be combined with clinical observations, patient history, and epidemiological information. The expected result is Negative.  Fact Sheet for Patients:  BloggerCourse.com  Fact Sheet for Healthcare Providers:  SeriousBroker.it  This test is no                          t yet approved or cleared by the [place] FDA and  has been authorized for detection and/or diagnosis of SARS-CoV-2 by FDA under an Emergency Use Authorization (EUA). This EUA will remain  in effect (meaning this test can be used) for the duration of the COVID-19 declaration under Section 564(b)(1) of the Act, 21 U.S.C.section 360bbb-3(b)(1), unless the authorization is terminated  or revoked sooner.       Influenza A by PCR 03/30/2022 NEGATIVE  NEGATIVE Final    Influenza B by PCR 03/30/2022 NEGATIVE  NEGATIVE Final   Comment: (NOTE) The Xpert Xpress SARS-CoV-2/FLU/RSV plus assay is intended as an aid in the diagnosis of influenza from Nasopharyngeal swab specimens and should not be used as a sole basis for treatment. Nasal washings and aspirates are unacceptable for Xpert Xpress SARS-CoV-2/FLU/RSV testing.  Fact Sheet for Patients: BloggerCourse.com  Fact Sheet for Healthcare Providers: SeriousBroker.it  This test is not yet approved or cleared by the [place] FDA and has been authorized for detection and/or diagnosis of SARS-CoV-2 by FDA under an Emergency Use Authorization (EUA). This EUA will remain in effect (meaning this test can be used) for the duration of the COVID-19 declaration under Section 564(b)(1) of the Act, 21 U.S.C. section 360bbb-3(b)(1), unless the authorization is terminated or revoked.  Performed at [facility], [address] [place], [place] [address]     Allergies: Benadryl [diphenhydramine hcl] and Vicodin [hydrocodone-acetaminophen]  PTA Medications: (Not in a hospital admission)   Medical Decision Making  Patient will be admitted to [facility] for safety monitoring and continuous assessment.  Patient will be reassessed tomorrow by psychiatry to determine if she is appropriate for [facility]    Initiate clonidine withdrawal protocol -clonidine taper -hydroxyzine 25 mg PO every 6 hours prn for anxiety -ondansetron 4 mg ODT every 6 hours prn nausea/vomiting -naproxen 500 mg BID prn for pain -dicyclomine 20 mg PO every 6 hours prn abdominal cramping -loperamide 2-4 mg capsule prn diarrhea -methocarbamol 500 mg PO every 8 hours prn spasms  Recommendations  Based on my evaluation the patient does not appear to have an emergency medical condition.  Maricela Bo, NP 04/02/22  1:08 AM

## 2022-04-02 NOTE — ED Notes (Signed)
Pt resting on pull out bed/chair in no observed distress.

## 2022-04-03 DIAGNOSIS — F1124 Opioid dependence with opioid-induced mood disorder: Secondary | ICD-10-CM | POA: Diagnosis not present

## 2022-04-03 DIAGNOSIS — R45851 Suicidal ideations: Secondary | ICD-10-CM | POA: Diagnosis not present

## 2022-04-03 DIAGNOSIS — F1990 Other psychoactive substance use, unspecified, uncomplicated: Secondary | ICD-10-CM | POA: Diagnosis not present

## 2022-04-03 DIAGNOSIS — F1123 Opioid dependence with withdrawal: Secondary | ICD-10-CM | POA: Diagnosis not present

## 2022-04-03 MED ORDER — QUETIAPINE FUMARATE 100 MG PO TABS
150.0000 mg | ORAL_TABLET | Freq: Every day | ORAL | Status: DC
  Administered 2022-04-03 – 2022-04-04 (×2): 150 mg via ORAL
  Filled 2022-04-03 (×2): qty 1

## 2022-04-03 MED ORDER — QUETIAPINE FUMARATE 50 MG PO TABS
50.0000 mg | ORAL_TABLET | Freq: Once | ORAL | Status: AC
  Administered 2022-04-03: 50 mg via ORAL
  Filled 2022-04-03: qty 1

## 2022-04-03 NOTE — ED Notes (Addendum)
Pt requesting medication for sleep and anxiety. Roselyn Bering, NP notified. Goldfish and apple juice provided per pt request.

## 2022-04-03 NOTE — ED Notes (Signed)
Pt asleep in bed. Respirations even and unlabored. Monitoring for safety. 

## 2022-04-03 NOTE — ED Notes (Signed)
Pt resting in bed. A&O x4, calm and cooperative. Denies SI/HI/AVH. No signs of distress noted. Monitoring for safety. 

## 2022-04-03 NOTE — ED Provider Notes (Signed)
Behavioral Health Progress Note  Date and Time: 04/03/2022 11:41 AM Name: Jessica Walton MRN:  [id number]  Subjective:   Jessica Walton is a 36 yr old female who presented to [facility] on 04/01/22 requesting detox from fentanyl and treatment for worsening depressive symptoms, she was admitted to [facility] on 9/9.  PPHx is significant for Polysubstance Use Disorder, Bipolar Disorder, Depression, and Anxiety.   She reports she is still having some withdrawal symptoms but they are slightly better from yesterday.  She reports some mild shakes, sweats, headache, and shortness of breath.  She reports her sleep last night was poor and that the Seroquel was not very effective.  She reports her appetite is doing good.  She reports no SI, HI, or AVH.  She reports that she has been on Seroquel in the past around 2015.  Discussed further increasing her Seroquel this evening and she was agreeable to that.  She reports that the Seroquel was helpful in the past for her "stress seizures."  She reports her anxiety today is 9 out of 10.  She reports no other concerns at present.   Diagnosis:  Final diagnoses:  Substance induced mood disorder (HCC)  Opioid dependence with withdrawal (HCC)    Total Time spent with patient: 15 minutes  Past Psychiatric History: Polysubstance Use Disorder, Bipolar Disorder, Depression, and Anxiety. Past Medical History:  Past Medical History:  Diagnosis Date   Asthma    No inhaler use x 1 yr (2013)   Bipolar 1 disorder (HCC)    Cocaine abuse (HCC) 2018   Headache    History of suicide attempt 2015   Hypertension    Lordosis    Opiate abuse (HCC) 2018   PTSD (post-traumatic stress disorder)    Seizures (HCC)    Tetrahydrocannabinol (THC) dependence (HCC)     Past Surgical History:  Procedure Laterality Date   EYE SURGERY     WISDOM TOOTH EXTRACTION     x4   Family History:  Family History  Problem Relation Age of Onset   Hypertension Maternal Grandmother    Diabetes  Maternal Grandmother    Seizures Neg Hx    Family Psychiatric  History: Mother- Bipolar Disorder Social History:  Social History   Substance and Sexual Activity  Alcohol Use Yes   Alcohol/week: 0.0 standard drinks of alcohol   Comment: occasional     Social History   Substance and Sexual Activity  Drug Use No    Social History   Socioeconomic History   Marital status: Single    Spouse name: Not on file   Number of children: Not on file   Years of education: 14   Highest education level: Not on file  Occupational History   Not on file  Tobacco Use   Smoking status: Some Days    Packs/day: 0.50    Years: 10.00    Total pack years: 5.00    Types: Cigarettes   Smokeless tobacco: Never  Substance and Sexual Activity   Alcohol use: Yes    Alcohol/week: 0.0 standard drinks of alcohol    Comment: occasional   Drug use: No   Sexual activity: Yes  Other Topics Concern   Not on file  Social History Narrative   Lives at home with family.   Caffeine use: none   Social Determinants of Corporate investment banker Strain: Not on file  Food Insecurity: Not on file  Transportation Needs: Not on file  Physical Activity: Not on  file  Stress: Not on file  Social Connections: Not on file   SDOH:  SDOH Screenings   Depression (PHQ2-9): High Risk (04/02/2022)  Tobacco Use: High Risk (03/30/2022)   Additional Social History:    Pain Medications: See MAR Prescriptions: See MAR Over the Counter: See MAR History of alcohol / drug use?: Yes Name of Substance 1: Fentanyl. 1 - Age of First Use: Pt reports, four years ago 1 - Amount (size/oz): Pt reports, sniffing a gram of Fentanyl three days ago. 1 - Frequency: Pt reports, everyday. 1 - Duration: Ongoing. 1 - Last Use / Amount: Three days ago. 1 - Method of Aquiring: Purchase. 1- Route of Use: Sniff.                  Sleep: Poor  Appetite:  Good  Current Medications:  Current Facility-Administered Medications   Medication Dose Route Frequency Provider Last Rate Last Admin   alum & mag hydroxide-simeth (MAALOX/MYLANTA) 200-200-20 MG/5ML suspension 30 mL  30 mL Oral Q4H PRN Ajibola, Ene A, NP       cloNIDine (CATAPRES) tablet 0.1 mg  0.1 mg Oral QID Ajibola, Ene A, NP   0.1 mg at 04/03/22 0931   Followed by   Melene Muller ON 04/04/2022] cloNIDine (CATAPRES) tablet 0.1 mg  0.1 mg Oral BH-qamhs Ajibola, Ene A, NP   0.1 mg at 04/01/22 2335   Followed by   Melene Muller ON 04/07/2022] cloNIDine (CATAPRES) tablet 0.1 mg  0.1 mg Oral QAC breakfast Ajibola, Ene A, NP       dicyclomine (BENTYL) tablet 20 mg  20 mg Oral Q6H PRN Ajibola, Ene A, NP   20 mg at 04/03/22 1610   hydrOXYzine (ATARAX) tablet 25 mg  25 mg Oral Q6H PRN Ajibola, Ene A, NP   25 mg at 04/03/22 9604   loperamide (IMODIUM) capsule 2-4 mg  2-4 mg Oral PRN Ajibola, Ene A, NP       magnesium hydroxide (MILK OF MAGNESIA) suspension 30 mL  30 mL Oral Daily PRN Ajibola, Ene A, NP       methocarbamol (ROBAXIN) tablet 500 mg  500 mg Oral Q8H PRN Ajibola, Ene A, NP   500 mg at 04/03/22 5409   naproxen (NAPROSYN) tablet 500 mg  500 mg Oral BID PRN Ajibola, Ene A, NP   500 mg at 04/03/22 0931   ondansetron (ZOFRAN-ODT) disintegrating tablet 4 mg  4 mg Oral Q6H PRN Ajibola, Ene A, NP       QUEtiapine (SEROQUEL) tablet 150 mg  150 mg Oral QHS Anglea Gordner, Mardelle Matte, MD       Current Outpatient Medications  Medication Sig Dispense Refill   cloNIDine (CATAPRES) 0.1 MG tablet Take 1 tablet (0.1 mg total) by mouth 2 (two) times daily. 20 tablet 0    Labs  Lab Results:  Admission on 04/01/2022  Component Date Value Ref Range Status   SARS Coronavirus 2 by RT PCR 04/02/2022 NEGATIVE  NEGATIVE Final   Comment: (NOTE) SARS-CoV-2 target nucleic acids are NOT DETECTED.  The SARS-CoV-2 RNA is generally detectable in upper respiratory specimens during the acute phase of infection. The lowest concentration of SARS-CoV-2 viral copies this assay can detect is 138 copies/mL.  A negative result does not preclude SARS-Cov-2 infection and should not be used as the sole basis for treatment or other patient management decisions. A negative result may occur with  improper specimen collection/handling, submission of specimen other than nasopharyngeal swab, presence of viral mutation(s) within  the areas targeted by this assay, and inadequate number of viral copies(<138 copies/mL). A negative result must be combined with clinical observations, patient history, and epidemiological information. The expected result is Negative.  Fact Sheet for Patients:  BloggerCourse.com  Fact Sheet for Healthcare Providers:  SeriousBroker.it  This test is no                          t yet approved or cleared by the [place] FDA and  has been authorized for detection and/or diagnosis of SARS-CoV-2 by FDA under an Emergency Use Authorization (EUA). This EUA will remain  in effect (meaning this test can be used) for the duration of the COVID-19 declaration under Section 564(b)(1) of the Act, 21 U.S.C.section 360bbb-3(b)(1), unless the authorization is terminated  or revoked sooner.       Influenza A by PCR 04/02/2022 NEGATIVE  NEGATIVE Final   Influenza B by PCR 04/02/2022 NEGATIVE  NEGATIVE Final   Comment: (NOTE) The Xpert Xpress SARS-CoV-2/FLU/RSV plus assay is intended as an aid in the diagnosis of influenza from Nasopharyngeal swab specimens and should not be used as a sole basis for treatment. Nasal washings and aspirates are unacceptable for Xpert Xpress SARS-CoV-2/FLU/RSV testing.  Fact Sheet for Patients: BloggerCourse.com  Fact Sheet for Healthcare Providers: SeriousBroker.it  This test is not yet approved or cleared by the [place] FDA and has been authorized for detection and/or diagnosis of SARS-CoV-2 by FDA under an Emergency Use Authorization (EUA).  This EUA will remain in effect (meaning this test can be used) for the duration of the COVID-19 declaration under Section 564(b)(1) of the Act, 21 U.S.C. section 360bbb-3(b)(1), unless the authorization is terminated or revoked.  Performed at [facility], [address], [place], [place] [address]    WBC 04/02/2022 5.2  4.0 - 10.5 K/uL Final   RBC 04/02/2022 5.08  3.87 - 5.11 MIL/uL Final   Hemoglobin 04/02/2022 15.0  12.0 - 15.0 g/dL Final   HCT 07/37/1062 43.7  36.0 - 46.0 % Final   MCV 04/02/2022 86.0  80.0 - 100.0 fL Final   MCH 04/02/2022 29.5  26.0 - 34.0 pg Final   MCHC 04/02/2022 34.3  30.0 - 36.0 g/dL Final   RDW 69/48/5462 15.8 (H)  11.5 - 15.5 % Final   Platelets 04/02/2022 296  150 - 400 K/uL Final   nRBC 04/02/2022 0.0  0.0 - 0.2 % Final   Neutrophils Relative % 04/02/2022 44  % Final   Neutro Abs 04/02/2022 2.2  1.7 - 7.7 K/uL Final   Lymphocytes Relative 04/02/2022 51  % Final   Lymphs Abs 04/02/2022 2.6  0.7 - 4.0 K/uL Final   Monocytes Relative 04/02/2022 5  % Final   Monocytes Absolute 04/02/2022 0.3  0.1 - 1.0 K/uL Final   Eosinophils Relative 04/02/2022 0  % Final   Eosinophils Absolute 04/02/2022 0.0  0.0 - 0.5 K/uL Final   Basophils Relative 04/02/2022 0  % Final   Basophils Absolute 04/02/2022 0.0  0.0 - 0.1 K/uL Final   Immature Granulocytes 04/02/2022 0  % Final   Abs Immature Granulocytes 04/02/2022 0.01  0.00 - 0.07 K/uL Final   Performed at [facility], [address] [place], [place] [address]   Sodium 04/02/2022 139  135 - 145 mmol/L Final   Potassium 04/02/2022 3.5  3.5 - 5.1 mmol/L Final   Chloride 04/02/2022 104  98 - 111 mmol/L Final  CO2 04/02/2022 27  22 - 32 mmol/L Final   Glucose, Bld 04/02/2022 128 (H)  70 - 99 mg/dL Final   Glucose reference range applies only to samples taken after fasting for at least 8 hours.   BUN 04/02/2022 13  6 - 20 mg/dL Final   Creatinine, Ser 04/02/2022 1.22 (H)  0.44 - 1.00 mg/dL Final    Calcium 47/82/9562 9.0  8.9 - 10.3 mg/dL Final   Total Protein 13/02/6577 7.1  6.5 - 8.1 g/dL Final   Albumin 46/96/2952 4.0  3.5 - 5.0 g/dL Final   AST 84/13/2440 27  15 - 41 U/L Final   ALT 04/02/2022 26  0 - 44 U/L Final   Alkaline Phosphatase 04/02/2022 35 (L)  38 - 126 U/L Final   Total Bilirubin 04/02/2022 0.6  0.3 - 1.2 mg/dL Final   GFR, Estimated 04/02/2022 60 (L)  >60 mL/min Final   Comment: (NOTE) Calculated using the CKD-EPI Creatinine Equation (2021)    Anion gap 04/02/2022 8  5 - 15 Final   Performed at [facility], [address], [place], [place] [address]   Hgb A1c MFr Bld 04/02/2022 5.4  4.8 - 5.6 % Final   Comment: (NOTE) Pre diabetes:          5.7%-6.4%  Diabetes:              >6.4%  Glycemic control for   <7.0% adults with diabetes    Mean Plasma Glucose 04/02/2022 108.28  mg/dL Final   Performed at [facility], [address], [place], [place] [address]   Alcohol, Ethyl (B) 04/02/2022 <10  <10 mg/dL Final   Comment: (NOTE) Lowest detectable limit for serum alcohol is 10 mg/dL.  For medical purposes only. Performed at [facility], [address] [place], [place] [address]    Cholesterol 04/02/2022 142  0 - 200 mg/dL Final   Triglycerides 42/59/5638 63  <150 mg/dL Final   HDL 75/64/3329 48  >40 mg/dL Final   Total CHOL/HDL Ratio 04/02/2022 3.0  RATIO Final   VLDL 04/02/2022 13  0 - 40 mg/dL Final   LDL Cholesterol 04/02/2022 81  0 - 99 mg/dL Final   Comment:        Total Cholesterol/HDL:CHD Risk Coronary Heart Disease Risk Table                     Men   Women  1/2 Average Risk   3.4   3.3  Average Risk       5.0   4.4  2 X Average Risk   9.6   7.1  3 X Average Risk  23.4   11.0        Use the calculated Patient Ratio above and the CHD Risk Table to determine the patient's CHD Risk.        ATP III CLASSIFICATION (LDL):  <100     mg/dL   Optimal  518-841  mg/dL   Near or Above                    Optimal  130-159  mg/dL    Borderline  660-630  mg/dL   High  >160     mg/dL   Very High Performed at [facility], [address] [place], [place] [address]    TSH 04/02/2022 0.503  0.350 - 4.500 uIU/mL Final   Comment: Performed by a 3rd Generation  assay with a functional sensitivity of <=0.01 uIU/mL. Performed at [facility], [address], [place], [place] [address]    Preg Test, Ur 04/02/2022 NEGATIVE  NEGATIVE Final   Comment:        THE SENSITIVITY OF THIS METHODOLOGY IS >24 mIU/mL    POC Amphetamine UR 04/02/2022 None Detected  NONE DETECTED (Cut Off Level 1000 ng/mL) Final   POC Secobarbital (BAR) 04/02/2022 None Detected  NONE DETECTED (Cut Off Level 300 ng/mL) Final   POC Buprenorphine (BUP) 04/02/2022 None Detected  NONE DETECTED (Cut Off Level 10 ng/mL) Final   POC Oxazepam (BZO) 04/02/2022 None Detected  NONE DETECTED (Cut Off Level 300 ng/mL) Final   POC Cocaine UR 04/02/2022 None Detected  NONE DETECTED (Cut Off Level 300 ng/mL) Final   POC Methamphetamine UR 04/02/2022 None Detected  NONE DETECTED (Cut Off Level 1000 ng/mL) Final   POC Morphine 04/02/2022 None Detected  NONE DETECTED (Cut Off Level 300 ng/mL) Final   POC Methadone UR 04/02/2022 None Detected  NONE DETECTED (Cut Off Level 300 ng/mL) Final   POC Oxycodone UR 04/02/2022 None Detected  NONE DETECTED (Cut Off Level 100 ng/mL) Final   POC Marijuana UR 04/02/2022 Positive (A)  NONE DETECTED (Cut Off Level 50 ng/mL) Final  Admission on 03/30/2022, Discharged on 03/30/2022  Component Date Value Ref Range Status   Sodium 03/30/2022 140  135 - 145 mmol/L Final   Potassium 03/30/2022 3.1 (L)  3.5 - 5.1 mmol/L Final   Chloride 03/30/2022 98  98 - 111 mmol/L Final   CO2 03/30/2022 24  22 - 32 mmol/L Final   Glucose, Bld 03/30/2022 140 (H)  70 - 99 mg/dL Final   Glucose reference range applies only to samples taken after fasting for at least 8 hours.   BUN 03/30/2022 29 (H)  6 - 20 mg/dL Final   Creatinine, Ser 03/30/2022 1.60  (H)  0.44 - 1.00 mg/dL Final   Calcium 83/41/9622 10.3  8.9 - 10.3 mg/dL Final   Total Protein 29/79/8921 10.0 (H)  6.5 - 8.1 g/dL Final   Albumin 19/41/7408 5.3 (H)  3.5 - 5.0 g/dL Final   AST 14/48/1856 27  15 - 41 U/L Final   ALT 03/30/2022 19  0 - 44 U/L Final   Alkaline Phosphatase 03/30/2022 46  38 - 126 U/L Final   Total Bilirubin 03/30/2022 0.7  0.3 - 1.2 mg/dL Final   GFR, Estimated 03/30/2022 43 (L)  >60 mL/min Final   Comment: (NOTE) Calculated using the CKD-EPI Creatinine Equation (2021)    Anion gap 03/30/2022 18 (H)  5 - 15 Final   Performed at [facility], [address], [place], [place] [address]   Lipase 03/30/2022 44  11 - 51 U/L Final   Performed at [facility], [address], [place], [place] [address]   WBC 03/30/2022 11.3 (H)  4.0 - 10.5 K/uL Final   RBC 03/30/2022 5.98 (H)  3.87 - 5.11 MIL/uL Final   Hemoglobin 03/30/2022 17.8 (H)  12.0 - 15.0 g/dL Final   HCT 85/88/5027 50.9 (H)  36.0 - 46.0 % Final   MCV 03/30/2022 85.1  80.0 - 100.0 fL Final   MCH 03/30/2022 29.8  26.0 - 34.0 pg Final   MCHC 03/30/2022 35.0  30.0 - 36.0 g/dL Final   RDW 74/06/8785 16.3 (H)  11.5 - 15.5 % Final   Platelets 03/30/2022 414 (H)  150 - 400 K/uL Final  nRBC 03/30/2022 0.0  0.0 - 0.2 % Final   Neutrophils Relative % 03/30/2022 82  % Final   Neutro Abs 03/30/2022 9.2 (H)  1.7 - 7.7 K/uL Final   Lymphocytes Relative 03/30/2022 13  % Final   Lymphs Abs 03/30/2022 1.5  0.7 - 4.0 K/uL Final   Monocytes Relative 03/30/2022 5  % Final   Monocytes Absolute 03/30/2022 0.6  0.1 - 1.0 K/uL Final   Eosinophils Relative 03/30/2022 0  % Final   Eosinophils Absolute 03/30/2022 0.0  0.0 - 0.5 K/uL Final   Basophils Relative 03/30/2022 0  % Final   Basophils Absolute 03/30/2022 0.0  0.0 - 0.1 K/uL Final   Immature Granulocytes 03/30/2022 0  % Final   Abs Immature Granulocytes 03/30/2022 0.05  0.00 - 0.07 K/uL Final   Performed at [facility], [address]  [place], [place] [address]   Alcohol, Ethyl (B) 03/30/2022 <10  <10 mg/dL Final   Comment: (NOTE) Lowest detectable limit for serum alcohol is 10 mg/dL.  For medical purposes only. Performed at [facility], [address] [place], [place] [address]    I-stat hCG, quantitative 03/30/2022 <5.0  <5 mIU/mL Final   Comment 3 03/30/2022          Final   Comment:   GEST. AGE      CONC.  (mIU/mL)   <=1 WEEK        5 - 50     2 WEEKS       50 - 500     3 WEEKS       100 - 10,000     4 WEEKS     1,000 - 30,000        FEMALE AND NON-PREGNANT FEMALE:     LESS THAN 5 mIU/mL    Sodium 03/30/2022 143  135 - 145 mmol/L Final   Potassium 03/30/2022 3.1 (L)  3.5 - 5.1 mmol/L Final   Chloride 03/30/2022 99  98 - 111 mmol/L Final   BUN 03/30/2022 37 (H)  6 - 20 mg/dL Final   Creatinine, Ser 03/30/2022 1.30 (H)  0.44 - 1.00 mg/dL Final   Glucose, Bld 09/81/1914 117 (H)  70 - 99 mg/dL Final   Glucose reference range applies only to samples taken after fasting for at least 8 hours.   Calcium, Ion 03/30/2022 1.08 (L)  1.15 - 1.40 mmol/L Final   TCO2 03/30/2022 32  22 - 32 mmol/L Final   Hemoglobin 03/30/2022 17.7 (H)  12.0 - 15.0 g/dL Final   HCT 78/29/5621 52.0 (H)  36.0 - 46.0 % Final   SARS Coronavirus 2 by RT PCR 03/30/2022 NEGATIVE  NEGATIVE Final   Comment: (NOTE) SARS-CoV-2 target nucleic acids are NOT DETECTED.  The SARS-CoV-2 RNA is generally detectable in upper respiratory specimens during the acute phase of infection. The lowest concentration of SARS-CoV-2 viral copies this assay can detect is 138 copies/mL. A negative result does not preclude SARS-Cov-2 infection and should not be used as the sole basis for treatment or other patient management decisions. A negative result may occur with  improper specimen collection/handling, submission of specimen other than nasopharyngeal swab, presence of viral mutation(s) within the areas targeted by this assay, and inadequate number of  viral copies(<138 copies/mL). A negative result must be combined with clinical observations, patient history, and epidemiological information. The expected result is Negative.  Fact Sheet for Patients:  BloggerCourse.com  Fact Sheet for Healthcare Providers:  SeriousBroker.it  This test is no                          t yet approved or cleared by the [place] and  has been authorized for detection and/or diagnosis of SARS-CoV-2 by FDA under an Emergency Use Authorization (EUA). This EUA will remain  in effect (meaning this test can be used) for the duration of the COVID-19 declaration under Section 564(b)(1) of the Act, 21 U.S.C.section 360bbb-3(b)(1), unless the authorization is terminated  or revoked sooner.       Influenza A by PCR 03/30/2022 NEGATIVE  NEGATIVE Final   Influenza B by PCR 03/30/2022 NEGATIVE  NEGATIVE Final   Comment: (NOTE) The Xpert Xpress SARS-CoV-2/FLU/RSV plus assay is intended as an aid in the diagnosis of influenza from Nasopharyngeal swab specimens and should not be used as a sole basis for treatment. Nasal washings and aspirates are unacceptable for Xpert Xpress SARS-CoV-2/FLU/RSV testing.  Fact Sheet for Patients: BloggerCourse.com  Fact Sheet for Healthcare Providers: SeriousBroker.it  This test is not yet approved or cleared by the [place] FDA and has been authorized for detection and/or diagnosis of SARS-CoV-2 by FDA under an Emergency Use Authorization (EUA). This EUA will remain in effect (meaning this test can be used) for the duration of the COVID-19 declaration under Section 564(b)(1) of the Act, 21 U.S.C. section 360bbb-3(b)(1), unless the authorization is terminated or revoked.  Performed at [facility], [address], [place], [place] [address]     Blood Alcohol level:  Lab Results  Component Value Date    ETH <10 04/02/2022   ETH <10 03/30/2022    Metabolic Disorder Labs: Lab Results  Component Value Date   HGBA1C 5.4 04/02/2022   MPG 108.28 04/02/2022   No results found for: "PROLACTIN" Lab Results  Component Value Date   CHOL 142 04/02/2022   TRIG 63 04/02/2022   HDL 48 04/02/2022   CHOLHDL 3.0 04/02/2022   VLDL 13 04/02/2022   LDLCALC 81 04/02/2022    Therapeutic Lab Levels: No results found for: "LITHIUM" No results found for: "VALPROATE" No results found for: "CBMZ"  Physical Findings   PHQ2-9    Flowsheet Row ED from 04/01/2022 in [facility]  PHQ-2 Total Score 3  PHQ-9 Total Score 18      Flowsheet Row ED from 04/01/2022 in [facility] ED from 03/30/2022 in [facility] EMERGENCY DEPARTMENT ED from 03/16/2021 in [facility] at [facility] RISK CATEGORY No Risk No Risk No Risk        Musculoskeletal  Strength & Muscle Tone: within normal limits Gait & Station:  laying in bed during interview Patient leans: N/A  Psychiatric Specialty Exam  Presentation  General Appearance: Appropriate for Environment; Casual; Fairly Groomed  Eye Contact:Fair  Speech:Clear and Coherent; Normal Rate  Speech Volume:Normal  Handedness:Right   Mood and Affect  Mood:Depressed; Anxious  Affect:Congruent   Thought Process  Thought Processes:Coherent; Goal Directed  Descriptions of Associations:Intact  Orientation:Full (Time, Place and Person)  Thought Content:Logical     Hallucinations:Hallucinations: None  Ideas of Reference:None  Suicidal Thoughts:Suicidal Thoughts: No  Homicidal Thoughts:Homicidal Thoughts: No   Sensorium  Memory:Immediate Fair; Recent Fair  Judgment:Fair  Insight:Fair   Executive Functions  Concentration:Good  Attention Span:Good  Recall:Good  Fund of Knowledge:Good  Language:Good   Psychomotor Activity  Psychomotor  Activity:Psychomotor Activity: Normal  Assets  Assets:Communication Skills; Desire for Improvement; Leisure Time; Physical Health   Sleep  Sleep:Sleep: Poor Number of Hours of Sleep: 0   Nutritional Assessment (For OBS and FBC admissions only) Has the patient had a weight loss or gain of 10 pounds or more in the last 3 months?: No Has the patient had a decrease in food intake/or appetite?: No Does the patient have dental problems?: No Does the patient have eating habits or behaviors that may be indicators of an eating disorder including binging or inducing vomiting?: No Has the patient recently lost weight without trying?: 0 Has the patient been eating poorly because of a decreased appetite?: 0 Malnutrition Screening Tool Score: 0    Physical Exam  Physical Exam Vitals and nursing note reviewed.  Constitutional:      General: She is not in acute distress.    Appearance: Normal appearance. She is not ill-appearing or toxic-appearing.  HENT:     Head: Normocephalic and atraumatic.  Pulmonary:     Effort: Pulmonary effort is normal.  Neurological:     General: No focal deficit present.     Mental Status: She is alert.    Review of Systems  Constitutional:  Positive for diaphoresis (mild).  Respiratory:  Positive for shortness of breath (mild). Negative for cough.   Cardiovascular:  Negative for chest pain.  Gastrointestinal:  Negative for abdominal pain, constipation, diarrhea, nausea and vomiting.  Neurological:  Positive for tremors (mild) and headaches (mild). Negative for dizziness and weakness.  Psychiatric/Behavioral:  Positive for depression. Negative for hallucinations and suicidal ideas. The patient is nervous/anxious and has insomnia.    Blood pressure 102/60, pulse 95, temperature 98.9 F (37.2 C), temperature source Oral, resp. rate 18, last menstrual period 03/07/2022, SpO2 100 %. There is no height or weight on file to calculate BMI.  Treatment Plan  Summary: Daily contact with patient to assess and evaluate symptoms and progress in treatment and Medication management   Jessica Walton is a 36 yr old female who [facility] requesting detox from fentanyl and treatment for worsening depressive symptoms, [facility]  PPHx is significant for Polysubstance Use Disorder, Bipolar Disorder, Depression, and Anxiety.   Jessica Walton is having some withdrawal symptoms today.  She reports that the Seroquel did not help her at all last night but since she has been on Seroquel in the past we will increase this to 150 mg tonight.  She could benefit from naltrexone, however, since she was using she would have to wait a few more days before starting it.  We will discuss the possibility of starting it at a later time.  We will not make any other medication changes time.  We will continue to monitor.   Bipolar Disorder  Depression  Anxiety: -Increase Seroquel to 150 mg QHS tonight for mood stability and sleep -Consider an antidepressant tomorrow   Opiate Use Disorder: -Continue COWS, last score=0 9/10 0605 -Continue Clonidine taper to end on 9/15 -Continue Imodium 2-4 mg PRN diarrhea -Continue Robaxin 500 mg q8 PRN muscle spasms -Continue Naproxen 500 mg BID PRN pain -Continue Zofran-ODT 4 mg q6 PRN nausea -Continue Bentyl 20 mg q6 PRN spasms -Continue Thiamine 100 mg daily for nutritional supplementation -Continue Multivitamin daily for nutritional supplementation -Discuss starting Naltrexone tomorrow.     Elevated Creatinine: -Is down trending over last 4 days- 1.60 -> 1.30 -> 1.22 -Encourage fluid intake -Monitor   -Continue PRN's: Maalox, Atarax, Milk  of Magnesia   Lauro Franklin, MD 04/03/2022 11:41 AM

## 2022-04-04 ENCOUNTER — Encounter (HOSPITAL_COMMUNITY): Payer: Self-pay

## 2022-04-04 DIAGNOSIS — F1990 Other psychoactive substance use, unspecified, uncomplicated: Secondary | ICD-10-CM | POA: Diagnosis not present

## 2022-04-04 DIAGNOSIS — R45851 Suicidal ideations: Secondary | ICD-10-CM | POA: Diagnosis not present

## 2022-04-04 DIAGNOSIS — F1124 Opioid dependence with opioid-induced mood disorder: Secondary | ICD-10-CM | POA: Diagnosis not present

## 2022-04-04 DIAGNOSIS — F1123 Opioid dependence with withdrawal: Secondary | ICD-10-CM | POA: Diagnosis not present

## 2022-04-04 MED ORDER — MIRTAZAPINE 7.5 MG PO TABS
7.5000 mg | ORAL_TABLET | Freq: Every day | ORAL | Status: DC
Start: 2022-04-04 — End: 2022-04-06
  Administered 2022-04-04 – 2022-04-05 (×2): 7.5 mg via ORAL
  Filled 2022-04-04: qty 7
  Filled 2022-04-04 (×2): qty 1

## 2022-04-04 MED ORDER — PRAZOSIN HCL 1 MG PO CAPS
1.0000 mg | ORAL_CAPSULE | Freq: Every day | ORAL | Status: DC
  Administered 2022-04-04: 1 mg via ORAL
  Filled 2022-04-04: qty 7
  Filled 2022-04-04: qty 1

## 2022-04-04 NOTE — ED Notes (Addendum)
Pt denies SI/HI/AVH. Easily agitated this am. Pt refused to attend group. Encouragement provided and reminded pt of tx agreement that she signed during admission. Pt stated, "Nope, I'm not going". Noted. Pt c/o stating, "that Seroquel they gave me last night didn't help at all. I want the doctor to know that I was up and down all last night. This is ridiculous". MD notified. Support provided. Informed pt to notify staff with any needs or concerns. Will continue to monitor for safety.

## 2022-04-04 NOTE — BH IP Treatment Plan (Unsigned)
Interdisciplinary Treatment and Diagnostic Plan Update  04/04/2022 Time of Session: 1005 KAVINA CANTAVE MRN: [id number]  Diagnosis:  Final diagnoses:  Substance induced mood disorder (HCC)  Opioid dependence with withdrawal (HCC)     Current Medications:  Current Facility-Administered Medications  Medication Dose Route Frequency Provider Last Rate Last Admin   alum & mag hydroxide-simeth (MAALOX/MYLANTA) 200-200-20 MG/5ML suspension 30 mL  30 mL Oral Q4H PRN Ajibola, Ene A, NP       cloNIDine (CATAPRES) tablet 0.1 mg  0.1 mg Oral BH-qamhs Ajibola, Ene A, NP   0.1 mg at 04/01/22 2335   Followed by   Melene Muller ON 04/07/2022] cloNIDine (CATAPRES) tablet 0.1 mg  0.1 mg Oral QAC breakfast Ajibola, Ene A, NP       dicyclomine (BENTYL) tablet 20 mg  20 mg Oral Q6H PRN Ajibola, Ene A, NP   20 mg at 04/03/22 2952   hydrOXYzine (ATARAX) tablet 25 mg  25 mg Oral Q6H PRN Ajibola, Ene A, NP   25 mg at 04/03/22 1418   loperamide (IMODIUM) capsule 2-4 mg  2-4 mg Oral PRN Ajibola, Ene A, NP       magnesium hydroxide (MILK OF MAGNESIA) suspension 30 mL  30 mL Oral Daily PRN Ajibola, Ene A, NP       methocarbamol (ROBAXIN) tablet 500 mg  500 mg Oral Q8H PRN Ajibola, Ene A, NP   500 mg at 04/03/22 8413   naproxen (NAPROSYN) tablet 500 mg  500 mg Oral BID PRN Ajibola, Ene A, NP   500 mg at 04/03/22 0931   ondansetron (ZOFRAN-ODT) disintegrating tablet 4 mg  4 mg Oral Q6H PRN Ajibola, Ene A, NP       QUEtiapine (SEROQUEL) tablet 150 mg  150 mg Oral QHS Lauro Franklin, MD   150 mg at 04/03/22 2118   Current Outpatient Medications  Medication Sig Dispense Refill   cloNIDine (CATAPRES) 0.1 MG tablet Take 1 tablet (0.1 mg total) by mouth 2 (two) times daily. 20 tablet 0   PTA Medications: Prior to Admission medications   Medication Sig Start Date End Date Taking? Authorizing Provider  cloNIDine (CATAPRES) 0.1 MG tablet Take 1 tablet (0.1 mg total) by mouth 2 (two) times daily. 03/30/22  Yes Margarita Grizzle, MD    Patient Stressors: Substance abuse   Other: Suicidal ideations; previous suicidal attempts; loss of sons in 2015 and 2019 and husband in 2014; paranoia.    Patient Strengths: Motivation for treatment/growth   Treatment Modalities: Medication Management, Group therapy, Case management,  1 to 1 session with clinician, Psychoeducation, Recreational therapy.   Physician Treatment Plan for Primary and Secondary Diagnosis:  Final diagnoses:  Substance induced mood disorder (HCC)  Opioid dependence with withdrawal (HCC)   Long Term Goal(s): Improvement in symptoms so as ready for discharge  Short Term Goals: Patient will attend at least of 50% of the groups daily. Pt will complete the PHQ9 on admission, day 3 and discharge. Patient will take medications as prescribed daily.  Medication Management: Evaluate patient's response, side effects, and tolerance of medication regimen.  Therapeutic Interventions: 1 to 1 sessions, Unit Group sessions and Medication administration.  Evaluation of Outcomes: Not Progressing  LCSW Treatment Plan for Primary Diagnosis:  Final diagnoses:  Substance induced mood disorder (HCC)  Opioid dependence with withdrawal (HCC)    Long Term Goal(s): Safe transition to appropriate next level of care at discharge.  Short Term Goals: Facilitate acceptance of mental health diagnosis and concerns  through verbal commitment to aftercare plan and appointments at discharge., Patient will identify one social support prior to discharge to aid in patient's recovery., Patient will attend AA/NA groups as scheduled., Identify minimum of 2 triggers associated with mental health/substance abuse issues with treatment team members., and Increase skills for wellness and recovery by attending 50% of scheduled groups.  Therapeutic Interventions: Assess for all discharge needs, 1 to 1 time with Child psychotherapist, Explore available resources and support systems, Assess for  adequacy in community support network, Educate family and significant other(s) on suicide prevention, Complete Psychosocial Assessment, Interpersonal group therapy.  Evaluation of Outcomes: Not Progressing   Progress in Treatment: Attending groups: No. Participating in groups: No. Taking medication as prescribed: Yes. Toleration medication: Yes. Family/Significant other contact made: No, will contact:  No one indicated. Patient understands diagnosis: Yes. Discussing patient identified problems/goals with staff: Yes. Medical problems stabilized or resolved: Yes. Denies suicidal/homicidal ideation: No. Issues/concerns per patient self-inventory: Yes. Other: Wanting to participate in residential treatment after [facility].  New problem(s) identified: No, Describe:  Nothing at this time.  New Short Term/Long Term Goal(s):  Attain and Maintain Sobriety  Patient Goals:  Attend a residential treatment program for substance use.  Discharge Plan or Barriers:  Distance  Reason for Continuation of Hospitalization: Depression Medication stabilization Suicidal ideation Withdrawal symptoms  Estimated Length of Stay:  Last 3 [place] Suicide Severity Risk Score: Flowsheet Row ED from 04/01/2022 in [facility] ED from 03/30/2022 in [facility] EMERGENCY DEPARTMENT ED from 03/16/2021 in [facility] RISK CATEGORY No Risk No Risk No Risk       Last PHQ 2/9 Scores:    04/02/2022    2:01 PM  Depression screen PHQ 2/9  Decreased Interest 3  Down, Depressed, Hopeless 0  PHQ - 2 Score 3  Altered sleeping 1  Tired, decreased energy 1  Change in appetite 1  Feeling bad or failure about yourself  3  Trouble concentrating 3  Moving slowly or fidgety/restless 3  Suicidal thoughts 3  PHQ-9 Score 18  Difficult doing work/chores Somewhat difficult    Scribe for Treatment Team: Jake Samples, LCSW 04/04/2022 4:18 PM

## 2022-04-04 NOTE — ED Notes (Signed)
Pt sleeping in no acute distress. RR even and unlabored. Safety maintained. 

## 2022-04-04 NOTE — ED Provider Notes (Cosign Needed Addendum)
Behavioral Health Progress Note  Date and Time: 04/04/2022 5:13 PM Name: Jessica Walton MRN:  [id number]  Subjective:   Jessica Walton is a 36 yr old female who presented to [facility] on 04/01/22 requesting detox from fentanyl and treatment for worsening depressive symptoms, she was admitted to [facility] on 9/9.  PPHx is significant for Polysubstance Use Disorder, Bipolar Disorder, Depression, and Anxiety.  Patient stated that she feels tired, having difficulties sleeping.  Stated that prior to coming to [facility], she was using fentanyl to help her sleep and about once a day to avoid feeling "sick".  She is unsure of how much she uses, stated that she knows people around her who has it.  Reported that she has poor sleep due to nightmares and flashback due to past traumas from 2015 where her child and husband died.  Stated that she is afraid to fall asleep without medication assistance, due to these PTSD nightmares.  She was amenable to starting prazosin for PTSD nightmares.  Stated that Seroquel currently is not helping her with sleep, and that she needs Ambien to stay asleep.  Stated that Klonopin and Xanax has worked in the past.  Stated that no matter how high the dose of trazodone it does not help her sleep.  Discussed with her Remeron, she is amenable to trial.  She denied SI/HI/AVH, contrary to safety on the unit, discussed 988, 911, [facility].  Denied access to guns or weapons.  She is amenable to residential.  Has no other questions or concerns.  ROS: SI:No Without Plan; Without Intent HI:No [id number]  Mood: Depressed; Dysphoric Sleep:Poor Appetite: Fair   Diagnosis:  Final diagnoses:  Substance induced mood disorder ([place])  Opioid dependence with withdrawal ([place])    Total Time spent with patient: 15 minutes  Past Psychiatric History: Polysubstance Use Disorder, Bipolar Disorder, Depression, and Anxiety.  Denied history of suicide attempt.  History of inpatient psychiatric stay on 2016 for she  presumed substance-induced mania Past Medical History:  Past Medical History:  Diagnosis Date   Asthma    No inhaler use x 1 yr (2013)   Bipolar 1 disorder ([place])    Cocaine abuse ([place]) 2018   Headache    History of suicide attempt 2015   Hypertension    Lordosis    Opiate abuse ([place]) 2018   PTSD (post-traumatic stress disorder)    Seizures ([facility])    Tetrahydrocannabinol (THC) dependence ([place])     Past Surgical History:  Procedure Laterality Date   EYE SURGERY     WISDOM TOOTH EXTRACTION     x4   Family History:  Family History  Problem Relation Age of Onset   Hypertension Maternal Grandmother    Diabetes Maternal Grandmother    Seizures Neg Hx    Family Psychiatric  History: Mother- Bipolar Disorder Social History:  Social History   Substance and Sexual Activity  Alcohol Use Yes   Alcohol/week: 0.0 standard drinks of alcohol   Comment: occasional     Social History   Substance and Sexual Activity  Drug Use No    Social History   Socioeconomic History   Marital status: Single    Spouse name: Not on file   Number of children: Not on file   Years of education: 14   Highest education level: Not on file  Occupational History   Not on file  Tobacco Use   Smoking status: Some Days    Packs/day: 0.50    Years: 10.00  Total pack years: 5.00    Types: Cigarettes   Smokeless tobacco: Never  Substance and Sexual Activity   Alcohol use: Yes    Alcohol/week: 0.0 standard drinks of alcohol    Comment: occasional   Drug use: No   Sexual activity: Yes  Other Topics Concern   Not on file  Social History Narrative   Lives at home with family.   Caffeine use: none   Social Determinants of Health   Financial Resource Strain: Not on file  Food Insecurity: Not on file  Transportation Needs: Not on file  Physical Activity: Not on file  Stress: Not on file  Social Connections: Not on file   SDOH:  SDOH Screenings   Depression (PHQ2-9): High Risk  (04/03/2022)  Tobacco Use: High Risk (03/30/2022)   Additional Social History:    Pain Medications: See MAR Prescriptions: See MAR Over the Counter: See MAR History of alcohol / drug use?: Yes Name of Substance 1: Fentanyl. 1 - Age of First Use: Pt reports, four years ago 1 - Amount (size/oz): Pt reports, sniffing a gram of Fentanyl three days ago. 1 - Frequency: Pt reports, everyday. 1 - Duration: Ongoing. 1 - Last Use / Amount: Three days ago. 1 - Method of Aquiring: Purchase. 1- Route of Use: Sniff.                   Current Medications:  Current Facility-Administered Medications  Medication Dose Route Frequency Provider Last Rate Last Admin   alum & mag hydroxide-simeth (MAALOX/MYLANTA) 200-200-20 MG/5ML suspension 30 mL  30 mL Oral Q4H PRN Ajibola, Ene A, NP       cloNIDine (CATAPRES) tablet 0.1 mg  0.1 mg Oral BH-qamhs Ajibola, Ene A, NP   0.1 mg at 04/01/22 2335   Followed by   Derrill Memo ON 04/07/2022] cloNIDine (CATAPRES) tablet 0.1 mg  0.1 mg Oral QAC breakfast Ajibola, Ene A, NP       dicyclomine (BENTYL) tablet 20 mg  20 mg Oral Q6H PRN Ajibola, Ene A, NP   20 mg at 04/03/22 [id number]   hydrOXYzine (ATARAX) tablet 25 mg  25 mg Oral Q6H PRN Ajibola, Ene A, NP   25 mg at 04/03/22 1418   loperamide (IMODIUM) capsule 2-4 mg  2-4 mg Oral PRN Ajibola, Ene A, NP       magnesium hydroxide (MILK OF MAGNESIA) suspension 30 mL  30 mL Oral Daily PRN Ajibola, Ene A, NP       methocarbamol (ROBAXIN) tablet 500 mg  500 mg Oral Q8H PRN Ajibola, Ene A, NP   500 mg at 04/03/22 [id number]   mirtazapine (REMERON) tablet 7.5 mg  7.5 mg Oral QHS Merrily Brittle, DO       naproxen (NAPROSYN) tablet 500 mg  500 mg Oral BID PRN Ajibola, Ene A, NP   500 mg at 04/03/22 0931   ondansetron (ZOFRAN-ODT) disintegrating tablet 4 mg  4 mg Oral Q6H PRN Ajibola, Ene A, NP       prazosin (MINIPRESS) capsule 1 mg  1 mg Oral QHS Merrily Brittle, DO       QUEtiapine (SEROQUEL) tablet 150 mg  150 mg Oral QHS Briant Cedar, MD   150 mg at 04/03/22 2118   Current Outpatient Medications  Medication Sig Dispense Refill   cloNIDine (CATAPRES) 0.1 MG tablet Take 1 tablet (0.1 mg total) by mouth 2 (two) times daily. 20 tablet 0    Labs  Lab Results:  Admission on  04/01/2022  Component Date Value Ref Range Status   SARS Coronavirus 2 by RT PCR 04/02/2022 NEGATIVE  NEGATIVE Final   Comment: (NOTE) SARS-CoV-2 target nucleic acids are NOT DETECTED.  The SARS-CoV-2 RNA is generally detectable in upper respiratory specimens during the acute phase of infection. The lowest concentration of SARS-CoV-2 viral copies this assay can detect is 138 copies/mL. A negative result does not preclude SARS-Cov-2 infection and should not be used as the sole basis for treatment or other patient management decisions. A negative result may occur with  improper specimen collection/handling, submission of specimen other than nasopharyngeal swab, presence of viral mutation(s) within the areas targeted by this assay, and inadequate number of viral copies(<138 copies/mL). A negative result must be combined with clinical observations, patient history, and epidemiological information. The expected result is Negative.  Fact Sheet for Patients:  BloggerCourse.com  Fact Sheet for Healthcare Providers:  SeriousBroker.it  This test is no                          t yet approved or cleared by the [place] FDA and  has been authorized for detection and/or diagnosis of SARS-CoV-2 by FDA under an Emergency Use Authorization (EUA). This EUA will remain  in effect (meaning this test can be used) for the duration of the COVID-19 declaration under Section 564(b)(1) of the Act, 21 U.S.C.section 360bbb-3(b)(1), unless the authorization is terminated  or revoked sooner.       Influenza A by PCR 04/02/2022 NEGATIVE  NEGATIVE Final   Influenza B by PCR 04/02/2022 NEGATIVE  NEGATIVE  Final   Comment: (NOTE) The Xpert Xpress SARS-CoV-2/FLU/RSV plus assay is intended as an aid in the diagnosis of influenza from Nasopharyngeal swab specimens and should not be used as a sole basis for treatment. Nasal washings and aspirates are unacceptable for Xpert Xpress SARS-CoV-2/FLU/RSV testing.  Fact Sheet for Patients: BloggerCourse.com  Fact Sheet for Healthcare Providers: SeriousBroker.it  This test is not yet approved or cleared by the [place] FDA and has been authorized for detection and/or diagnosis of SARS-CoV-2 by FDA under an Emergency Use Authorization (EUA). This EUA will remain in effect (meaning this test can be used) for the duration of the COVID-19 declaration under Section 564(b)(1) of the Act, 21 U.S.C. section 360bbb-3(b)(1), unless the authorization is terminated or revoked.  Performed at [facility], [address] [place], [place] [address]    WBC 04/02/2022 5.2  4.0 - 10.5 K/uL Final   RBC 04/02/2022 5.08  3.87 - 5.11 MIL/uL Final   Hemoglobin 04/02/2022 15.0  12.0 - 15.0 g/dL Final   HCT 60/45/4098 43.7  36.0 - 46.0 % Final   MCV 04/02/2022 86.0  80.0 - 100.0 fL Final   MCH 04/02/2022 29.5  26.0 - 34.0 pg Final   MCHC 04/02/2022 34.3  30.0 - 36.0 g/dL Final   RDW 11/91/4782 15.8 (H)  11.5 - 15.5 % Final   Platelets 04/02/2022 296  150 - 400 K/uL Final   nRBC 04/02/2022 0.0  0.0 - 0.2 % Final   Neutrophils Relative % 04/02/2022 44  % Final   Neutro Abs 04/02/2022 2.2  1.7 - 7.7 K/uL Final   Lymphocytes Relative 04/02/2022 51  % Final   Lymphs Abs 04/02/2022 2.6  0.7 - 4.0 K/uL Final   Monocytes Relative 04/02/2022 5  % Final   Monocytes Absolute 04/02/2022 0.3  0.1 - 1.0 K/uL Final   Eosinophils  Relative 04/02/2022 0  % Final   Eosinophils Absolute 04/02/2022 0.0  0.0 - 0.5 K/uL Final   Basophils Relative 04/02/2022 0  % Final   Basophils Absolute 04/02/2022 0.0  0.0 - 0.1 K/uL  Final   Immature Granulocytes 04/02/2022 0  % Final   Abs Immature Granulocytes 04/02/2022 0.01  0.00 - 0.07 K/uL Final   Performed at [facility], [place] [address] [place], [place] [address]   Sodium 04/02/2022 139  135 - 145 mmol/L Final   Potassium 04/02/2022 3.5  3.5 - 5.1 mmol/L Final   Chloride 04/02/2022 104  98 - 111 mmol/L Final   CO2 04/02/2022 27  22 - 32 mmol/L Final   Glucose, Bld 04/02/2022 128 (H)  70 - 99 mg/dL Final   Glucose reference range applies only to samples taken after fasting for at least 8 hours.   BUN 04/02/2022 13  6 - 20 mg/dL Final   Creatinine, Ser 04/02/2022 1.22 (H)  0.44 - 1.00 mg/dL Final   Calcium 04/02/2022 9.0  8.9 - 10.3 mg/dL Final   Total Protein 04/02/2022 7.1  6.5 - 8.1 g/dL Final   Albumin 04/02/2022 4.0  3.5 - 5.0 g/dL Final   AST 04/02/2022 27  15 - 41 U/L Final   ALT 04/02/2022 26  0 - 44 U/L Final   Alkaline Phosphatase 04/02/2022 35 (L)  38 - 126 U/L Final   Total Bilirubin 04/02/2022 0.6  0.3 - 1.2 mg/dL Final   GFR, Estimated 04/02/2022 60 (L)  >60 mL/min Final   Comment: (NOTE) Calculated using the CKD-EPI Creatinine Equation (2021)    Anion gap 04/02/2022 8  5 - 15 Final   Performed at [place] [address] [place], [place] [address]   Hgb A1c MFr Bld 04/02/2022 5.4  4.8 - 5.6 % Final   Comment: (NOTE) Pre diabetes:          5.7%-6.4%  Diabetes:              >6.4%  Glycemic control for   <7.0% adults with diabetes    Mean Plasma Glucose 04/02/2022 108.28  mg/dL Final   Performed at [facility], [place] [address] [place], [place] [address]   Alcohol, Ethyl (B) 04/02/2022 <10  <10 mg/dL Final   Comment: (NOTE) Lowest detectable limit for serum alcohol is 10 mg/dL.  For medical purposes only. Performed at [facility], [place] [address] [place], [place] [address]    Cholesterol 04/02/2022 142  0 - 200 mg/dL Final   Triglycerides 04/02/2022 63  <150 mg/dL Final   HDL 04/02/2022 48  >40 mg/dL  Final   Total CHOL/HDL Ratio 04/02/2022 3.0  RATIO Final   VLDL 04/02/2022 13  0 - 40 mg/dL Final   LDL Cholesterol 04/02/2022 81  0 - 99 mg/dL Final   Comment:        Total Cholesterol/HDL:CHD Risk Coronary Heart Disease Risk Table                     Men   Women  1/2 Average Risk   3.4   3.3  Average Risk       5.0   4.4  2 X Average Risk   9.6   7.1  3 X Average Risk  23.4   11.0        Use the calculated Patient Ratio above and the CHD Risk Table to determine the  patient's CHD Risk.        ATP III CLASSIFICATION (LDL):  <100     mg/dL   Optimal  478-295  mg/dL   Near or Above                    Optimal  130-159  mg/dL   Borderline  621-308  mg/dL   High  >657     mg/dL   Very High Performed at [facility], [address] [place], [place] [address]    TSH 04/02/2022 0.503  0.350 - 4.500 uIU/mL Final   Comment: Performed by a 3rd Generation assay with a functional sensitivity of <=0.01 uIU/mL. Performed at [facility], [address], [place], [place] [address]    Preg Test, Ur 04/02/2022 NEGATIVE  NEGATIVE Final   Comment:        THE SENSITIVITY OF THIS METHODOLOGY IS >24 mIU/mL    POC Amphetamine UR 04/02/2022 None Detected  NONE DETECTED (Cut Off Level 1000 ng/mL) Final   POC Secobarbital (BAR) 04/02/2022 None Detected  NONE DETECTED (Cut Off Level 300 ng/mL) Final   POC Buprenorphine (BUP) 04/02/2022 None Detected  NONE DETECTED (Cut Off Level 10 ng/mL) Final   POC Oxazepam (BZO) 04/02/2022 None Detected  NONE DETECTED (Cut Off Level 300 ng/mL) Final   POC Cocaine UR 04/02/2022 None Detected  NONE DETECTED (Cut Off Level 300 ng/mL) Final   POC Methamphetamine UR 04/02/2022 None Detected  NONE DETECTED (Cut Off Level 1000 ng/mL) Final   POC Morphine 04/02/2022 None Detected  NONE DETECTED (Cut Off Level 300 ng/mL) Final   POC Methadone UR 04/02/2022 None Detected  NONE DETECTED (Cut Off Level 300 ng/mL) Final   POC Oxycodone UR 04/02/2022 None Detected   NONE DETECTED (Cut Off Level 100 ng/mL) Final   POC Marijuana UR 04/02/2022 Positive (A)  NONE DETECTED (Cut Off Level 50 ng/mL) Final  Admission on 03/30/2022, Discharged on 03/30/2022  Component Date Value Ref Range Status   Sodium 03/30/2022 140  135 - 145 mmol/L Final   Potassium 03/30/2022 3.1 (L)  3.5 - 5.1 mmol/L Final   Chloride 03/30/2022 98  98 - 111 mmol/L Final   CO2 03/30/2022 24  22 - 32 mmol/L Final   Glucose, Bld 03/30/2022 140 (H)  70 - 99 mg/dL Final   Glucose reference range applies only to samples taken after fasting for at least 8 hours.   BUN 03/30/2022 29 (H)  6 - 20 mg/dL Final   Creatinine, Ser 03/30/2022 1.60 (H)  0.44 - 1.00 mg/dL Final   Calcium 41/32/4401 10.3  8.9 - 10.3 mg/dL Final   Total Protein 02/72/5366 10.0 (H)  6.5 - 8.1 g/dL Final   Albumin 44/09/4740 5.3 (H)  3.5 - 5.0 g/dL Final   AST 59/56/3875 27  15 - 41 U/L Final   ALT 03/30/2022 19  0 - 44 U/L Final   Alkaline Phosphatase 03/30/2022 46  38 - 126 U/L Final   Total Bilirubin 03/30/2022 0.7  0.3 - 1.2 mg/dL Final   GFR, Estimated 03/30/2022 43 (L)  >60 mL/min Final   Comment: (NOTE) Calculated using the CKD-EPI Creatinine Equation (2021)    Anion gap 03/30/2022 18 (H)  5 - 15 Final   Performed at [facility], [address] [place], [place] [address]   Lipase 03/30/2022 44  11 - 51 U/L Final   Performed at [facility], [address]  [address] [place], [place] [address]   WBC 03/30/2022 11.3 (H)  4.0 - 10.5 K/uL Final   RBC 03/30/2022 5.98 (H)  3.87 - 5.11 MIL/uL Final   Hemoglobin 03/30/2022 17.8 (H)  12.0 - 15.0 g/dL Final   HCT 03/30/2022 50.9 (H)  36.0 - 46.0 % Final   MCV 03/30/2022 85.1  80.0 - 100.0 fL Final   MCH 03/30/2022 29.8  26.0 - 34.0 pg Final   MCHC 03/30/2022 35.0  30.0 - 36.0 g/dL Final   RDW 03/30/2022 16.3 (H)  11.5 - 15.5 % Final   Platelets 03/30/2022 414 (H)  150 - 400 K/uL Final   nRBC 03/30/2022 0.0  0.0 - 0.2 % Final   Neutrophils Relative % 03/30/2022 82   % Final   Neutro Abs 03/30/2022 9.2 (H)  1.7 - 7.7 K/uL Final   Lymphocytes Relative 03/30/2022 13  % Final   Lymphs Abs 03/30/2022 1.5  0.7 - 4.0 K/uL Final   Monocytes Relative 03/30/2022 5  % Final   Monocytes Absolute 03/30/2022 0.6  0.1 - 1.0 K/uL Final   Eosinophils Relative 03/30/2022 0  % Final   Eosinophils Absolute 03/30/2022 0.0  0.0 - 0.5 K/uL Final   Basophils Relative 03/30/2022 0  % Final   Basophils Absolute 03/30/2022 0.0  0.0 - 0.1 K/uL Final   Immature Granulocytes 03/30/2022 0  % Final   Abs Immature Granulocytes 03/30/2022 0.05  0.00 - 0.07 K/uL Final   Performed at [facility], [place] [address] [place], [place] [address]   Alcohol, Ethyl (B) 03/30/2022 <10  <10 mg/dL Final   Comment: (NOTE) Lowest detectable limit for serum alcohol is 10 mg/dL.  For medical purposes only. Performed at [facility], [place] [address] [place], [place] [address]    I-stat hCG, quantitative 03/30/2022 <5.0  <5 mIU/mL Final   Comment 3 03/30/2022          Final   Comment:   GEST. AGE      CONC.  (mIU/mL)   <=1 WEEK        5 - 50     2 WEEKS       50 - 500     3 WEEKS       100 - 10,000     4 WEEKS     1,000 - 30,000        FEMALE AND NON-PREGNANT FEMALE:     LESS THAN 5 mIU/mL    Sodium 03/30/2022 143  135 - 145 mmol/L Final   Potassium 03/30/2022 3.1 (L)  3.5 - 5.1 mmol/L Final   Chloride 03/30/2022 99  98 - 111 mmol/L Final   BUN 03/30/2022 37 (H)  6 - 20 mg/dL Final   Creatinine, Ser 03/30/2022 1.30 (H)  0.44 - 1.00 mg/dL Final   Glucose, Bld 03/30/2022 117 (H)  70 - 99 mg/dL Final   Glucose reference range applies only to samples taken after fasting for at least 8 hours.   Calcium, Ion 03/30/2022 1.08 (L)  1.15 - 1.40 mmol/L Final   TCO2 03/30/2022 32  22 - 32 mmol/L Final   Hemoglobin 03/30/2022 17.7 (H)  12.0 - 15.0 g/dL Final   HCT 03/30/2022 52.0 (H)  36.0 - 46.0 % Final   SARS Coronavirus 2 by RT PCR 03/30/2022 NEGATIVE  NEGATIVE Final   Comment:  (NOTE) SARS-CoV-2 target nucleic acids are NOT DETECTED.  The SARS-CoV-2 RNA is generally detectable in upper respiratory specimens during  the acute phase of infection. The lowest concentration of SARS-CoV-2 viral copies this assay can detect is 138 copies/mL. A negative result does not preclude SARS-Cov-2 infection and should not be used as the sole basis for treatment or other patient management decisions. A negative result may occur with  improper specimen collection/handling, submission of specimen other than nasopharyngeal swab, presence of viral mutation(s) within the areas targeted by this assay, and inadequate number of viral copies(<138 copies/mL). A negative result must be combined with clinical observations, patient history, and epidemiological information. The expected result is Negative.  Fact Sheet for Patients:  EntrepreneurPulse.com.au  Fact Sheet for Healthcare Providers:  IncredibleEmployment.be  This test is no                          t yet approved or cleared by the [place] FDA and  has been authorized for detection and/or diagnosis of SARS-CoV-2 by FDA under an Emergency Use Authorization (EUA). This EUA will remain  in effect (meaning this test can be used) for the duration of the COVID-19 declaration under Section 564(b)(1) of the Act, 21 U.S.C.section 360bbb-3(b)(1), unless the authorization is terminated  or revoked sooner.       Influenza A by PCR 03/30/2022 NEGATIVE  NEGATIVE Final   Influenza B by PCR 03/30/2022 NEGATIVE  NEGATIVE Final   Comment: (NOTE) The Xpert Xpress SARS-CoV-2/FLU/RSV plus assay is intended as an aid in the diagnosis of influenza from Nasopharyngeal swab specimens and should not be used as a sole basis for treatment. Nasal washings and aspirates are unacceptable for Xpert Xpress SARS-CoV-2/FLU/RSV testing.  Fact Sheet for Patients: EntrepreneurPulse.com.au  Fact  Sheet for Healthcare Providers: IncredibleEmployment.be  This test is not yet approved or cleared by the [place] FDA and has been authorized for detection and/or diagnosis of SARS-CoV-2 by FDA under an Emergency Use Authorization (EUA). This EUA will remain in effect (meaning this test can be used) for the duration of the COVID-19 declaration under Section 564(b)(1) of the Act, 21 U.S.C. section 360bbb-3(b)(1), unless the authorization is terminated or revoked.  Performed at [facility], [place] [address], [place], [place] [address]     Blood Alcohol level:  Lab Results  Component Value Date   ETH <10 04/02/2022   ETH <10 123456    Metabolic Disorder Labs: Lab Results  Component Value Date   HGBA1C 5.4 04/02/2022   MPG 108.28 04/02/2022   No results found for: "PROLACTIN" Lab Results  Component Value Date   CHOL 142 04/02/2022   TRIG 63 04/02/2022   HDL 48 04/02/2022   CHOLHDL 3.0 04/02/2022   VLDL 13 04/02/2022   LDLCALC 81 04/02/2022    Therapeutic Lab Levels: No results found for: "LITHIUM" No results found for: "VALPROATE" No results found for: "CBMZ"  Physical Findings   PHQ2-9    Flowsheet Row ED from 04/01/2022 in [facility]  PHQ-2 Total Score 6  PHQ-9 Total Score 24      Flowsheet Row ED from 04/01/2022 in [facility] ED from 03/30/2022 in [place] ED from 03/16/2021 in [facility] No Risk No Risk No Risk        Musculoskeletal  Strength & Muscle Tone: within normal limits Gait & Station:  laying in bed during interview Patient leans: N/A  Psychiatric Specialty Exam  Presentation  General Appearance:  Appropriate for Environment; Casual; Fairly Groomed  Eye Contact:Fair  Speech:Clear and Coherent; Normal Rate  Speech Volume:Normal  Handedness:Right   Mood and  Affect  Mood:Depressed; Dysphoric  Affect:Congruent; Appropriate; Restricted   Thought Process  Thought Processes:Goal Directed; Coherent; Linear  Descriptions of Associations:Intact  Orientation:Full (Time, Place and Person)  Thought Content:WDL; Logical     Hallucinations:Hallucinations: None  Ideas of Reference:None  Suicidal Thoughts:Suicidal Thoughts: No  Homicidal Thoughts:Homicidal Thoughts: No   Sensorium  Memory:Immediate Good  Judgment:Fair  Insight:Shallow   Executive Functions  Concentration:Fair  Attention Span:Fair  [place]  Language:Good   Psychomotor Activity  Psychomotor Activity:Psychomotor Activity: Psychomotor Retardation   Assets  Assets:Communication Skills; Desire for Improvement; Resilience   Sleep  Sleep:Sleep: Poor   Nutritional Assessment (For OBS and FBC admissions only) Has the patient had a weight loss or gain of 10 pounds or more in the last 3 months?: No Has the patient had a decrease in food intake/or appetite?: No Does the patient have dental problems?: No Does the patient have eating habits or behaviors that may be indicators of an eating disorder including binging or inducing vomiting?: No Has the patient recently lost weight without trying?: 0 Has the patient been eating poorly because of a decreased appetite?: 0 Malnutrition Screening Tool Score: 0    Physical Exam  Physical Exam Vitals and nursing note reviewed.  Constitutional:      General: She is not in acute distress.    Appearance: Normal appearance. She is not ill-appearing or toxic-appearing.  HENT:     Head: Normocephalic and atraumatic.  Pulmonary:     Effort: Pulmonary effort is normal.  Neurological:     General: No focal deficit present.     Mental Status: She is alert.    Review of Systems  Constitutional:  Positive for diaphoresis (mild).  Respiratory:  Positive for shortness of breath (mild). Negative  for cough.   Cardiovascular:  Negative for chest pain.  Gastrointestinal:  Negative for abdominal pain, constipation, diarrhea, nausea and vomiting.  Neurological:  Positive for tremors (mild) and headaches (mild). Negative for dizziness and weakness.  Psychiatric/Behavioral:  Positive for depression. Negative for hallucinations and suicidal ideas. The patient is nervous/anxious and has insomnia.    Blood pressure 115/74, pulse 60, temperature 98.6 F (37 C), temperature source Oral, resp. rate 17, last menstrual period 03/07/2022, SpO2 99 %. There is no height or weight on file to calculate BMI.  Treatment Plan Summary: Daily contact with patient to assess and evaluate symptoms and progress in treatment and Medication management   Jessica Walton is a 36 yr old female who presented to [facility] on 04/01/22 requesting detox from fentanyl and treatment for worsening depressive symptoms, she was admitted to [facility] on 9/9.  PPHx is significant for Polysubstance Use Disorder, Bipolar Disorder, Depression, and Anxiety.  Bipolar Disorder  MDD  GAD  PTSD: Reported having MDD/GAD prior to substance use.  States she initially started using in 2013-11-24 after the death of her child and her husband later that same year.  Stated she used opiates to help her fall asleep and avoid PTSD nightmares and to help with withdrawal.  Stated that Seroquel was still ineffective and requested Ambien stated that trazodone did not work for her.  Concern for med seeking behaviors.  Discussed prazosin and Remeron, patient was amenable.  BP 115/74, will monitor for symptoms of orthostasis and hypotension. -Continued Seroquel to 150 mg QHS tonight for mood stability and  sleep -Started Remeron 7.5 mg nightly as sleep adjunct -Started prazosin 1 mg nightly for PTSD nightmares -Repeat EKG, since last EKG was done prior to starting and up titrating antipsychotic  Opiate Use Disorder: Denied IVDU, and declined high risk screening of HIV,  RPR, and hepatitis at this time. COWS 4,2,1,0,0 -Continue COWS -Continue Imodium 2-4 mg PRN diarrhea -Continue Robaxin 500 mg q8 PRN muscle spasms -Continue Naproxen 500 mg BID PRN pain -Continue Zofran-ODT 4 mg q6 PRN nausea -Continue Bentyl 20 mg q6 PRN spasms -Continue Thiamine 100 mg daily for nutritional supplementation -Continue Multivitamin daily for nutritional supplementation -Plan to discuss naltrexone prior to dc -Continue Clonidine taper to end on 9/15  Elevated Creatinine: -Is down trending over last 4 days- 1.60 -> 1.30 -> 1.22 -Encourage fluid intake -Monitor, repeat CMP 04/05/2022   -Continue PRN's: Maalox, Atarax, Milk of [place], DO 04/04/2022 5:13 PM

## 2022-04-04 NOTE — ED Notes (Signed)
Pt was sleeping@the  time, was irritable due to be awaken. She answered questions, and went back to sleep. She has no c/o of pain or distress.will continue to monitor for safety

## 2022-04-04 NOTE — ED Notes (Signed)
Pt asleep in bed. Respirations even and unlabored. Monitoring for safety. 

## 2022-04-04 NOTE — ED Notes (Signed)
Pt is in the dayroom. Respirations are even and unlabored. No acute distress noted. Will continue to monitor for safety.

## 2022-04-05 ENCOUNTER — Encounter (HOSPITAL_COMMUNITY): Payer: Self-pay | Admitting: Urology

## 2022-04-05 DIAGNOSIS — F1990 Other psychoactive substance use, unspecified, uncomplicated: Secondary | ICD-10-CM | POA: Diagnosis not present

## 2022-04-05 DIAGNOSIS — F1124 Opioid dependence with opioid-induced mood disorder: Secondary | ICD-10-CM | POA: Diagnosis not present

## 2022-04-05 DIAGNOSIS — R45851 Suicidal ideations: Secondary | ICD-10-CM | POA: Diagnosis not present

## 2022-04-05 DIAGNOSIS — F1123 Opioid dependence with withdrawal: Secondary | ICD-10-CM | POA: Diagnosis not present

## 2022-04-05 LAB — IRON AND TIBC
Iron: 84 ug/dL (ref 28–170)
Saturation Ratios: 21 % (ref 10.4–31.8)
TIBC: 392 ug/dL (ref 250–450)
UIBC: 308 ug/dL

## 2022-04-05 LAB — COMPREHENSIVE METABOLIC PANEL
ALT: 28 U/L (ref 0–44)
AST: 18 U/L (ref 15–41)
Albumin: 4 g/dL (ref 3.5–5.0)
Alkaline Phosphatase: 39 U/L (ref 38–126)
Anion gap: 8 (ref 5–15)
BUN: 22 mg/dL — ABNORMAL HIGH (ref 6–20)
CO2: 27 mmol/L (ref 22–32)
Calcium: 9.4 mg/dL (ref 8.9–10.3)
Chloride: 108 mmol/L (ref 98–111)
Creatinine, Ser: 1.13 mg/dL — ABNORMAL HIGH (ref 0.44–1.00)
GFR, Estimated: >60 mL/min (ref >60)
Glucose, Bld: 85 mg/dL (ref 70–99)
Potassium: 3.8 mmol/L (ref 3.5–5.1)
Sodium: 143 mmol/L (ref 135–145)
Total Bilirubin: 0.6 mg/dL (ref 0.3–1.2)
Total Protein: 7.7 g/dL (ref 6.5–8.1)

## 2022-04-05 LAB — FERRITIN: Ferritin: 67 ng/mL (ref 11–307)

## 2022-04-05 MED ORDER — CLONIDINE HCL 0.2 MG PO TABS: 0.2000 mg | ORAL_TABLET | Freq: Every day | ORAL | Status: DC

## 2022-04-05 MED ORDER — HYDROXYZINE HCL 25 MG PO TABS
50.0000 mg | ORAL_TABLET | Freq: Four times a day (QID) | ORAL | Status: DC | PRN
  Administered 2022-04-05 (×2): 50 mg via ORAL
  Filled 2022-04-05 (×2): qty 2

## 2022-04-05 MED ORDER — CLONIDINE HCL 0.2 MG PO TABS
0.2000 mg | ORAL_TABLET | ORAL | Status: DC
  Administered 2022-04-06: 0.2 mg via ORAL
  Filled 2022-04-05: qty 1

## 2022-04-05 MED ORDER — NICOTINE POLACRILEX 2 MG MT GUM
4.0000 mg | CHEWING_GUM | OROMUCOSAL | Status: DC | PRN
  Administered 2022-04-05 (×2): 4 mg via ORAL
  Filled 2022-04-05 (×2): qty 2

## 2022-04-05 NOTE — ED Provider Notes (Signed)
Behavioral Health Progress Note  Date and Time: 04/05/2022 12:28 PM Name: Jessica Walton MRN:  [id number]  Subjective:   Jessica Walton is a 36 yr old female who presented to [facility] on 04/01/22 requesting detox from fentanyl and treatment for worsening depressive symptoms, she was admitted to [facility] on 9/9.  PPHx is significant for Polysubstance Use Disorder, Bipolar Disorder, Depression, and Anxiety. Total duration of encounter: 4 days   Patient stated that she feels better today with less body aches, feeling more awake, and more optimistic about treatment.  However she continued to have withdrawal symptoms intermittently throughout the day, and especially last night that interrupted her sleep.  Stated that she did not have any nightmares, but was mainly because of how restless she felt from withdrawal.  She denied side effects from current medications, continue to endorse how Seroquel does not work for her.  Requested nicotine gum.  The patient no other questions or concerns, she is still amenable to residential rehab, she was amenable to plan per below.  ROS: SI:No Without Plan; Without Intent HI:No [id number]  Mood: Dysphoric; Anxious Sleep:Poor (Interrupted by opiate withdrawal symptoms, no nightmares) Appetite: Fair   Diagnosis:  Final diagnoses:  Substance induced mood disorder (HCC)  Opioid dependence with withdrawal ([place])    Total Time spent with patient: 15 minutes  Past Psychiatric History: Polysubstance Use Disorder, Bipolar Disorder, Depression, and Anxiety.  Denied history of suicide attempt.  History of inpatient psychiatric stay on 2016 for she presumed substance-induced mania Past Medical History:  Past Medical History:  Diagnosis Date   Asthma    No inhaler use x 1 yr (2013)   Bipolar 1 disorder ([place])    Cocaine abuse ([place]) 2018   Headache    History of suicide attempt 2015   Hypertension    Lordosis    Opiate abuse ([place]) 2018   PTSD (post-traumatic stress  disorder)    Seizures (HCC)    Tetrahydrocannabinol (THC) dependence ([place])     Past Surgical History:  Procedure Laterality Date   EYE SURGERY     WISDOM TOOTH EXTRACTION     x4   Family History:  Family History  Problem Relation Age of Onset   Hypertension Maternal Grandmother    Diabetes Maternal Grandmother    Seizures Neg Hx    Family Psychiatric  History: Mother- Bipolar Disorder Social History:  Social History   Substance and Sexual Activity  Alcohol Use Yes   Alcohol/week: 0.0 standard drinks of alcohol   Comment: occasional     Social History   Substance and Sexual Activity  Drug Use No    Social History   Socioeconomic History   Marital status: Single    Spouse name: Not on file   Number of children: Not on file   Years of education: 14   Highest education level: Not on file  Occupational History   Not on file  Tobacco Use   Smoking status: Some Days    Packs/day: 0.50    Years: 10.00    Total pack years: 5.00    Types: Cigarettes   Smokeless tobacco: Never  Substance and Sexual Activity   Alcohol use: Yes    Alcohol/week: 0.0 standard drinks of alcohol    Comment: occasional   Drug use: No   Sexual activity: Yes  Other Topics Concern   Not on file  Social History Narrative   Lives at home with family.   Caffeine use: none   Social  Determinants of Health   Financial Resource Strain: Not on file  Food Insecurity: Not on file  Transportation Needs: Not on file  Physical Activity: Not on file  Stress: Not on file  Social Connections: Not on file   SDOH:  SDOH Screenings   Depression (PHQ2-9): High Risk (04/03/2022)  Tobacco Use: High Risk (04/05/2022)   Additional Social History:    Pain Medications: See MAR Prescriptions: See MAR Over the Counter: See MAR History of alcohol / drug use?: Yes Name of Substance 1: Fentanyl. 1 - Age of First Use: Pt reports, four years ago 1 - Amount (size/oz): Pt reports, sniffing a gram of Fentanyl  three days ago. 1 - Frequency: Pt reports, everyday. 1 - Duration: Ongoing. 1 - Last Use / Amount: Three days ago. 1 - Method of Aquiring: Purchase. 1- Route of Use: Sniff.                   Current Medications:  Current Facility-Administered Medications  Medication Dose Route Frequency Provider Last Rate Last Admin   alum & mag hydroxide-simeth (MAALOX/MYLANTA) 200-200-20 MG/5ML suspension 30 mL  30 mL Oral Q4H PRN Ajibola, Ene A, NP       cloNIDine (CATAPRES) tablet 0.2 mg  0.2 mg Oral BH-qamhs Princess Bruins, DO       Followed by   Melene Muller ON 04/07/2022] cloNIDine (CATAPRES) tablet 0.2 mg  0.2 mg Oral QAC breakfast Princess Bruins, DO       dicyclomine (BENTYL) tablet 20 mg  20 mg Oral Q6H PRN Ajibola, Ene A, NP   20 mg at 04/03/22 2585   hydrOXYzine (ATARAX) tablet 25 mg  25 mg Oral Q6H PRN Ajibola, Ene A, NP   25 mg at 04/05/22 0051   loperamide (IMODIUM) capsule 2-4 mg  2-4 mg Oral PRN Ajibola, Ene A, NP       magnesium hydroxide (MILK OF MAGNESIA) suspension 30 mL  30 mL Oral Daily PRN Ajibola, Ene A, NP       methocarbamol (ROBAXIN) tablet 500 mg  500 mg Oral Q8H PRN Ajibola, Ene A, NP   500 mg at 04/03/22 2778   mirtazapine (REMERON) tablet 7.5 mg  7.5 mg Oral QHS Princess Bruins, DO   7.5 mg at 04/04/22 2125   naproxen (NAPROSYN) tablet 500 mg  500 mg Oral BID PRN Ajibola, Ene A, NP   500 mg at 04/03/22 0931   nicotine polacrilex (NICORETTE) gum 4 mg  4 mg Oral PRN Princess Bruins, DO       ondansetron (ZOFRAN-ODT) disintegrating tablet 4 mg  4 mg Oral Q6H PRN Ajibola, Ene A, NP       prazosin (MINIPRESS) capsule 1 mg  1 mg Oral QHS Princess Bruins, DO   1 mg at 04/04/22 2125   Current Outpatient Medications  Medication Sig Dispense Refill   cloNIDine (CATAPRES) 0.1 MG tablet Take 1 tablet (0.1 mg total) by mouth 2 (two) times daily. 20 tablet 0    Labs  Lab Results:  Admission on 04/01/2022  Component Date Value Ref Range Status   SARS Coronavirus 2 by RT PCR 04/02/2022  NEGATIVE  NEGATIVE Final   Comment: (NOTE) SARS-CoV-2 target nucleic acids are NOT DETECTED.  The SARS-CoV-2 RNA is generally detectable in upper respiratory specimens during the acute phase of infection. The lowest concentration of SARS-CoV-2 viral copies this assay can detect is 138 copies/mL. A negative result does not preclude SARS-Cov-2 infection and should not be used as the  sole basis for treatment or other patient management decisions. A negative result may occur with  improper specimen collection/handling, submission of specimen other than nasopharyngeal swab, presence of viral mutation(s) within the areas targeted by this assay, and inadequate number of viral copies(<138 copies/mL). A negative result must be combined with clinical observations, patient history, and epidemiological information. The expected result is Negative.  Fact Sheet for Patients:  BloggerCourse.com  Fact Sheet for Healthcare Providers:  SeriousBroker.it  This test is no                          t yet approved or cleared by the [place] FDA and  has been authorized for detection and/or diagnosis of SARS-CoV-2 by FDA under an Emergency Use Authorization (EUA). This EUA will remain  in effect (meaning this test can be used) for the duration of the COVID-19 declaration under Section 564(b)(1) of the Act, 21 U.S.C.section 360bbb-3(b)(1), unless the authorization is terminated  or revoked sooner.       Influenza A by PCR 04/02/2022 NEGATIVE  NEGATIVE Final   Influenza B by PCR 04/02/2022 NEGATIVE  NEGATIVE Final   Comment: (NOTE) The Xpert Xpress SARS-CoV-2/FLU/RSV plus assay is intended as an aid in the diagnosis of influenza from Nasopharyngeal swab specimens and should not be used as a sole basis for treatment. Nasal washings and aspirates are unacceptable for Xpert Xpress SARS-CoV-2/FLU/RSV testing.  Fact Sheet for  Patients: BloggerCourse.com  Fact Sheet for Healthcare Providers: SeriousBroker.it  This test is not yet approved or cleared by the [place] FDA and has been authorized for detection and/or diagnosis of SARS-CoV-2 by FDA under an Emergency Use Authorization (EUA). This EUA will remain in effect (meaning this test can be used) for the duration of the COVID-19 declaration under Section 564(b)(1) of the Act, 21 U.S.C. section 360bbb-3(b)(1), unless the authorization is terminated or revoked.  Performed at [facility], [address] [place], [place] [address]    WBC 04/02/2022 5.2  4.0 - 10.5 K/uL Final   RBC 04/02/2022 5.08  3.87 - 5.11 MIL/uL Final   Hemoglobin 04/02/2022 15.0  12.0 - 15.0 g/dL Final   HCT 93/57/0177 43.7  36.0 - 46.0 % Final   MCV 04/02/2022 86.0  80.0 - 100.0 fL Final   MCH 04/02/2022 29.5  26.0 - 34.0 pg Final   MCHC 04/02/2022 34.3  30.0 - 36.0 g/dL Final   RDW 93/90/3009 15.8 (H)  11.5 - 15.5 % Final   Platelets 04/02/2022 296  150 - 400 K/uL Final   nRBC 04/02/2022 0.0  0.0 - 0.2 % Final   Neutrophils Relative % 04/02/2022 44  % Final   Neutro Abs 04/02/2022 2.2  1.7 - 7.7 K/uL Final   Lymphocytes Relative 04/02/2022 51  % Final   Lymphs Abs 04/02/2022 2.6  0.7 - 4.0 K/uL Final   Monocytes Relative 04/02/2022 5  % Final   Monocytes Absolute 04/02/2022 0.3  0.1 - 1.0 K/uL Final   Eosinophils Relative 04/02/2022 0  % Final   Eosinophils Absolute 04/02/2022 0.0  0.0 - 0.5 K/uL Final   Basophils Relative 04/02/2022 0  % Final   Basophils Absolute 04/02/2022 0.0  0.0 - 0.1 K/uL Final   Immature Granulocytes 04/02/2022 0  % Final   Abs Immature Granulocytes 04/02/2022 0.01  0.00 - 0.07 K/uL Final   Performed at [facility], [address], [place], [place] [address]  Sodium 04/02/2022 139  135 - 145 mmol/L Final   Potassium 04/02/2022 3.5  3.5 - 5.1 mmol/L Final   Chloride 04/02/2022 104   98 - 111 mmol/L Final   CO2 04/02/2022 27  22 - 32 mmol/L Final   Glucose, Bld 04/02/2022 128 (H)  70 - 99 mg/dL Final   Glucose reference range applies only to samples taken after fasting for at least 8 hours.   BUN 04/02/2022 13  6 - 20 mg/dL Final   Creatinine, Ser 04/02/2022 1.22 (H)  0.44 - 1.00 mg/dL Final   Calcium 04/02/2022 9.0  8.9 - 10.3 mg/dL Final   Total Protein 04/02/2022 7.1  6.5 - 8.1 g/dL Final   Albumin 04/02/2022 4.0  3.5 - 5.0 g/dL Final   AST 04/02/2022 27  15 - 41 U/L Final   ALT 04/02/2022 26  0 - 44 U/L Final   Alkaline Phosphatase 04/02/2022 35 (L)  38 - 126 U/L Final   Total Bilirubin 04/02/2022 0.6  0.3 - 1.2 mg/dL Final   GFR, Estimated 04/02/2022 60 (L)  >60 mL/min Final   Comment: (NOTE) Calculated using the CKD-EPI Creatinine Equation (2021)    Anion gap 04/02/2022 8  5 - 15 Final   Performed at [facility] [address] [place], [place] [address]   Hgb A1c MFr Bld 04/02/2022 5.4  4.8 - 5.6 % Final   Comment: (NOTE) Pre diabetes:          5.7%-6.4%  Diabetes:              >6.4%  Glycemic control for   <7.0% adults with diabetes    Mean Plasma Glucose 04/02/2022 108.28  mg/dL Final   Performed at [facility], [place] [address], [place], [place] [address]   Alcohol, Ethyl (B) 04/02/2022 <10  <10 mg/dL Final   Comment: (NOTE) Lowest detectable limit for serum alcohol is 10 mg/dL.  For medical purposes only. Performed at [facility], [place] [address] [place], [place] [address]    Cholesterol 04/02/2022 142  0 - 200 mg/dL Final   Triglycerides 04/02/2022 63  <150 mg/dL Final   HDL 04/02/2022 48  >40 mg/dL Final   Total CHOL/HDL Ratio 04/02/2022 3.0  RATIO Final   VLDL 04/02/2022 13  0 - 40 mg/dL Final   LDL Cholesterol 04/02/2022 81  0 - 99 mg/dL Final   Comment:        Total Cholesterol/HDL:CHD Risk Coronary Heart Disease Risk Table                     Men   Women  1/2 Average Risk   3.4   3.3  Average Risk       5.0    4.4  2 X Average Risk   9.6   7.1  3 X Average Risk  23.4   11.0        Use the calculated Patient Ratio above and the CHD Risk Table to determine the patient's CHD Risk.        ATP III CLASSIFICATION (LDL):  <100     mg/dL   Optimal  100-129  mg/dL   Near or Above                    Optimal  130-159  mg/dL   Borderline  160-189  mg/dL   High  >190     mg/dL   Very High  Performed at [facility], [place] [address] [place], [place] [address]    TSH 04/02/2022 0.503  0.350 - 4.500 uIU/mL Final   Comment: Performed by a 3rd Generation assay with a functional sensitivity of <=0.01 uIU/mL. Performed at [facility], [place] [address] [place],  [address]    Preg Test, Ur 04/02/2022 NEGATIVE  NEGATIVE Final   Comment:        THE SENSITIVITY OF THIS METHODOLOGY IS >24 mIU/mL    POC Amphetamine UR 04/02/2022 None Detected  NONE DETECTED (Cut Off Level 1000 ng/mL) Final   POC Secobarbital (BAR) 04/02/2022 None Detected  NONE DETECTED (Cut Off Level 300 ng/mL) Final   POC Buprenorphine (BUP) 04/02/2022 None Detected  NONE DETECTED (Cut Off Level 10 ng/mL) Final   POC Oxazepam (BZO) 04/02/2022 None Detected  NONE DETECTED (Cut Off Level 300 ng/mL) Final   POC Cocaine UR 04/02/2022 None Detected  NONE DETECTED (Cut Off Level 300 ng/mL) Final   POC Methamphetamine UR 04/02/2022 None Detected  NONE DETECTED (Cut Off Level 1000 ng/mL) Final   POC Morphine 04/02/2022 None Detected  NONE DETECTED (Cut Off Level 300 ng/mL) Final   POC Methadone UR 04/02/2022 None Detected  NONE DETECTED (Cut Off Level 300 ng/mL) Final   POC Oxycodone UR 04/02/2022 None Detected  NONE DETECTED (Cut Off Level 100 ng/mL) Final   POC Marijuana UR 04/02/2022 Positive (A)  NONE DETECTED (Cut Off Level 50 ng/mL) Final   Sodium 04/05/2022 143  135 - 145 mmol/L Final   Potassium 04/05/2022 3.8  3.5 - 5.1 mmol/L Final   Chloride 04/05/2022 108  98 - 111 mmol/L Final   CO2 04/05/2022 27  22 - 32 mmol/L Final    Glucose, Bld 04/05/2022 85  70 - 99 mg/dL Final   Glucose reference range applies only to samples taken after fasting for at least 8 hours.   BUN 04/05/2022 22 (H)  6 - 20 mg/dL Final   Creatinine, Ser 04/05/2022 1.13 (H)  0.44 - 1.00 mg/dL Final   Calcium 04/05/2022 9.4  8.9 - 10.3 mg/dL Final   Total Protein 04/05/2022 7.7  6.5 - 8.1 g/dL Final   Albumin 04/05/2022 4.0  3.5 - 5.0 g/dL Final   AST 04/05/2022 18  15 - 41 U/L Final   ALT 04/05/2022 28  0 - 44 U/L Final   Alkaline Phosphatase 04/05/2022 39  38 - 126 U/L Final   Total Bilirubin 04/05/2022 0.6  0.3 - 1.2 mg/dL Final   GFR, Estimated 04/05/2022 >60  >60 mL/min Final   Comment: (NOTE) Calculated using the CKD-EPI Creatinine Equation (2021)    Anion gap 04/05/2022 8  5 - 15 Final   Performed at [facility] [address] [place],  [address]  Admission on 03/30/2022, Discharged on 03/30/2022  Component Date Value Ref Range Status   Sodium 03/30/2022 140  135 - 145 mmol/L Final   Potassium 03/30/2022 3.1 (L)  3.5 - 5.1 mmol/L Final   Chloride 03/30/2022 98  98 - 111 mmol/L Final   CO2 03/30/2022 24  22 - 32 mmol/L Final   Glucose, Bld 03/30/2022 140 (H)  70 - 99 mg/dL Final   Glucose reference range applies only to samples taken after fasting for at least 8 hours.   BUN 03/30/2022 29 (H)  6 - 20 mg/dL Final   Creatinine, Ser 03/30/2022 1.60 (H)  0.44 - 1.00 mg/dL Final   Calcium 03/30/2022 10.3  8.9 - 10.3 mg/dL Final   Total Protein 03/30/2022 10.0 (H)  6.5 - 8.1 g/dL Final   Albumin 03/30/2022 5.3 (H)  3.5 - 5.0 g/dL Final   AST 03/30/2022 27  15 - 41 U/L Final   ALT 03/30/2022 19  0 - 44 U/L Final   Alkaline Phosphatase 03/30/2022 46  38 - 126 U/L Final   Total Bilirubin 03/30/2022 0.7  0.3 - 1.2 mg/dL Final   GFR, Estimated 03/30/2022 43 (L)  >60 mL/min Final   Comment: (NOTE) Calculated using the CKD-EPI Creatinine Equation (2021)    Anion gap 03/30/2022 18 (H)  5 - 15 Final   Performed at [facility], [place] [address], [place], [place] [address]   Lipase 03/30/2022 44  11 - 51 U/L Final   Performed at [facility] [address] [place], [place] [address]   WBC 03/30/2022 11.3 (H)  4.0 - 10.5 K/uL Final   RBC 03/30/2022 5.98 (H)  3.87 - 5.11 MIL/uL Final   Hemoglobin 03/30/2022 17.8 (H)  12.0 - 15.0 g/dL Final   HCT 03/30/2022 50.9 (H)  36.0 - 46.0 % Final   MCV 03/30/2022 85.1  80.0 - 100.0 fL Final   MCH 03/30/2022 29.8  26.0 - 34.0 pg Final   MCHC 03/30/2022 35.0  30.0 - 36.0 g/dL Final   RDW 03/30/2022 16.3 (H)  11.5 - 15.5 % Final   Platelets 03/30/2022 414 (H)  150 - 400 K/uL Final   nRBC 03/30/2022 0.0  0.0 - 0.2 % Final   Neutrophils Relative % 03/30/2022 82  % Final   Neutro Abs 03/30/2022 9.2 (H)  1.7 - 7.7 K/uL Final   Lymphocytes Relative 03/30/2022 13  % Final   Lymphs Abs 03/30/2022 1.5  0.7 - 4.0 K/uL Final   Monocytes Relative 03/30/2022 5  % Final   Monocytes Absolute 03/30/2022 0.6  0.1 - 1.0 K/uL Final   Eosinophils Relative 03/30/2022 0  % Final   Eosinophils Absolute 03/30/2022 0.0  0.0 - 0.5 K/uL Final   Basophils Relative 03/30/2022 0  % Final   Basophils Absolute 03/30/2022 0.0  0.0 - 0.1 K/uL Final   Immature Granulocytes 03/30/2022 0  % Final   Abs Immature Granulocytes 03/30/2022 0.05  0.00 - 0.07 K/uL Final   Performed at [facility], [place] [address], [place], [place] [address]   Alcohol, Ethyl (B) 03/30/2022 <10  <10 mg/dL Final   Comment: (NOTE) Lowest detectable limit for serum alcohol is 10 mg/dL.  For medical purposes only. Performed at [facility], [place] [address] [place], [place] [address]    I-stat hCG, quantitative 03/30/2022 <5.0  <5 mIU/mL Final   Comment 3 03/30/2022          Final   Comment:   GEST. AGE      CONC.  (mIU/mL)   <=1 WEEK        5 - 50     2 WEEKS       50 - 500     3 WEEKS       100 - 10,000     4 WEEKS     1,000 - 30,000        FEMALE AND NON-PREGNANT FEMALE:     LESS THAN 5 mIU/mL     Sodium 03/30/2022 143  135 - 145 mmol/L Final   Potassium 03/30/2022 3.1 (L)  3.5 - 5.1 mmol/L Final  Chloride 03/30/2022 99  98 - 111 mmol/L Final   BUN 03/30/2022 37 (H)  6 - 20 mg/dL Final   Creatinine, Ser 03/30/2022 1.30 (H)  0.44 - 1.00 mg/dL Final   Glucose, Bld 03/30/2022 117 (H)  70 - 99 mg/dL Final   Glucose reference range applies only to samples taken after fasting for at least 8 hours.   Calcium, Ion 03/30/2022 1.08 (L)  1.15 - 1.40 mmol/L Final   TCO2 03/30/2022 32  22 - 32 mmol/L Final   Hemoglobin 03/30/2022 17.7 (H)  12.0 - 15.0 g/dL Final   HCT 03/30/2022 52.0 (H)  36.0 - 46.0 % Final   SARS Coronavirus 2 by RT PCR 03/30/2022 NEGATIVE  NEGATIVE Final   Comment: (NOTE) SARS-CoV-2 target nucleic acids are NOT DETECTED.  The SARS-CoV-2 RNA is generally detectable in upper respiratory specimens during the acute phase of infection. The lowest concentration of SARS-CoV-2 viral copies this assay can detect is 138 copies/mL. A negative result does not preclude SARS-Cov-2 infection and should not be used as the sole basis for treatment or other patient management decisions. A negative result may occur with  improper specimen collection/handling, submission of specimen other than nasopharyngeal swab, presence of viral mutation(s) within the areas targeted by this assay, and inadequate number of viral copies(<138 copies/mL). A negative result must be combined with clinical observations, patient history, and epidemiological information. The expected result is Negative.  Fact Sheet for Patients:  EntrepreneurPulse.com.au  Fact Sheet for Healthcare Providers:  IncredibleEmployment.be  This test is no                          t yet approved or cleared by the [place] FDA and  has been authorized for detection and/or diagnosis of SARS-CoV-2 by FDA under an Emergency Use Authorization (EUA). This EUA will remain  in effect (meaning  this test can be used) for the duration of the COVID-19 declaration under Section 564(b)(1) of the Act, 21 U.S.C.section 360bbb-3(b)(1), unless the authorization is terminated  or revoked sooner.       Influenza A by PCR 03/30/2022 NEGATIVE  NEGATIVE Final   Influenza B by PCR 03/30/2022 NEGATIVE  NEGATIVE Final   Comment: (NOTE) The Xpert Xpress SARS-CoV-2/FLU/RSV plus assay is intended as an aid in the diagnosis of influenza from Nasopharyngeal swab specimens and should not be used as a sole basis for treatment. Nasal washings and aspirates are unacceptable for Xpert Xpress SARS-CoV-2/FLU/RSV testing.  Fact Sheet for Patients: EntrepreneurPulse.com.au  Fact Sheet for Healthcare Providers: IncredibleEmployment.be  This test is not yet approved or cleared by the [place] FDA and has been authorized for detection and/or diagnosis of SARS-CoV-2 by FDA under an Emergency Use Authorization (EUA). This EUA will remain in effect (meaning this test can be used) for the duration of the COVID-19 declaration under Section 564(b)(1) of the Act, 21 U.S.C. section 360bbb-3(b)(1), unless the authorization is terminated or revoked.  Performed at [facility], [place] [address], [place], [place] [address]     Blood Alcohol level:  Lab Results  Component Value Date   [facility] <10 04/02/2022   ETH <10 123456    Metabolic Disorder Labs: Lab Results  Component Value Date   HGBA1C 5.4 04/02/2022   MPG 108.28 04/02/2022   No results found for: "PROLACTIN" Lab Results  Component Value Date   CHOL 142 04/02/2022   TRIG 63 04/02/2022   HDL 48 04/02/2022  CHOLHDL 3.0 04/02/2022   VLDL 13 04/02/2022   LDLCALC 81 04/02/2022    Therapeutic Lab Levels: No results found for: "LITHIUM" No results found for: "VALPROATE" No results found for: "CBMZ"  Physical Findings   PHQ2-9    Flowsheet Row ED from 04[facility]01/2022 in [facility]  PHQ-2 Total Score 6  PHQ-9 Total Score 24      Flowsheet Row ED from 04/01/2022 in [facility] ED from 03/30/2022 in [facility] EMERGENCY [facility]PARTMENT ED from 03/16/2021 in [facility] RISK CATEGORY No Risk No Risk No Risk        Musculoskeletal  Strength & Muscle Tone: within normal limits Gait & Station:  laying in bed during interview Patient leans: N/A  Psychiatric Specialty Exam  Presentation  General Appearance: Appropriate for Environment; Casual; Fairly Groomed  Eye Contact:Minimal  Speech:Clear and Coherent; Normal Rate  Speech Volume:Normal  Handedness:Right   Mood and Affect  Mood:Dysphoric; Anxious  Affect:Congruent; Appropriate; Full Range   Thought Process  Thought Processes:Goal Directed; Coherent; Linear  Descriptions of Associations:Intact  Orientation:Full (Time, Place and Person)  Thought Content:Rumination (Nicotine craving, wants to be able to sleep at night.  Denied nightmares, but was unable to sleep last night due to opiate withdrawal symptoms.)     Hallucinations:Hallucinations: None  Ideas of Reference:None  Suicidal Thoughts:Suicidal Thoughts: No  Homicidal Thoughts:Homicidal Thoughts: No   Sensorium  Memory:Immediate Good  Judgment:Fair  Insight:Shallow   Executive Functions  Concentration:Fair  Attention Span:Fair  Recall:Good  Fund of Knowledge:Good  Language:Good   Psychomotor Activity  Psychomotor Activity:Psychomotor Activity: Normal   Assets  Assets:Communication Skills; Desire for Improvement; Resilience; Financial Resources/Insurance   Sleep  Sleep:Sleep: Poor (Interrupted by opiate withdrawal symptoms, no nightmares)   Nutritional Assessment (For OBS and FBC admissions only) Has the patient had a weight loss or gain of 10 pounds or more in the last 3 months?: No Has the patient had a decrease in food  intake/or appetite?: No Does the patient have dental problems?: No Does the patient have eating habits or behaviors that may be indicators of an eating disorder including binging or inducing vomiting?: No Has the patient recently lost weight without trying?: 0 Has the patient been eating poorly because of a decreased appetite?: 0 Malnutrition Screening Tool Score: 0    Physical Exam  Physical Exam Vitals and nursing note reviewed.  Constitutional:      General: She is not in acute distress.    Appearance: Normal appearance. She is not ill-appearing or toxic-appearing.  HENT:     Head: Normocephalic and atraumatic.  Pulmonary:     Effort: Pulmonary effort is normal.  Neurological:     General: No focal deficit present.     Mental Status: She is alert.    Review of Systems  Constitutional:  Positive for chills and malaise/fatigue.  Respiratory:  Negative for shortness of breath.   Cardiovascular:  Negative for chest pain.  Gastrointestinal:  Positive for abdominal pain and constipation. Negative for nausea and vomiting.  Musculoskeletal:  Positive for myalgias.  Neurological:  Positive for tremors. Negative for dizziness and headaches.   Blood pressure 124/88, pulse 100, temperature 98.4 F (36.9 C), temperature source Oral, resp. rate 18, last menstrual period 03/07/2022, SpO2 96 %. There is no height or weight on file to calculate BMI.  Treatment Plan Summary: Daily contact with patient to assess and evaluate symptoms and progress in  treatment and Medication management   Jessica Walton is a 36 yr old female who presented to [facility] on 04/01/22 requesting detox from fentanyl and treatment for worsening depressive symptoms, she was admitted to [facility] on 9/9.  PPHx is significant for Polysubstance Use Disorder, Bipolar Disorder, Depression, and Anxiety.  Bipolar Disorder  MDD  GAD  PTSD: Reported having MDD/GAD prior to substance use.  States she initially started using in 11-01-2013  after the death of her child and her husband later that same year.  Stated she used opiates to help her fall asleep and avoid PTSD nightmares and to help with withdrawal.  Stated that Seroquel was still ineffective and requested Ambien stated that trazodone did not work for her.  Concern for med seeking behaviors.  Discussed prazosin and Remeron, patient was amenable.  BP 115/74, will monitor for symptoms of orthostasis and hypotension.  Patient continues to endorse inefficiency of Seroquel, stated that it makes her feel restless, requested that it be discontinued. Will exchange seroquel to another second generation antipsychotic.  -Discontinued Seroquel to 150 mg QHS tonight for mood stability and sleep -Continued Remeron 7.5 mg nightly as sleep adjunct -Started prazosin 1 mg nightly for PTSD nightmares -Repeat EKG, since last EKG was done prior to starting and up titrating antipsychotic - patient declined -Consider starting risperdal or other SGA nightly   Opiate Use Disorder: Denied IVDU, and declined high risk screening of HIV, RPR, and hepatitis at this time.  Continued to have opiate withdrawal symptoms, given patient's blood pressure of 120s/80s, will increase clonidine taper to assist with withdrawal symptoms. COWS 4,2,1,0,0,1 -Continue COWS -Continue Imodium 2-4 mg PRN diarrhea -Continue Robaxin 500 mg q8 PRN muscle spasms -Continue Naproxen 500 mg BID PRN pain -Continue Zofran-ODT 4 mg q6 PRN nausea -Continue Bentyl 20 mg q6 PRN spasms -Continue Thiamine 100 mg daily for nutritional supplementation -Continue Multivitamin daily for nutritional supplementation -Plan to discuss naltrexone prior to dc -Increased clonidine taper 0.1 mg to 0.2 mg (ends on 9/15)  Restless legs Suspect secondary to opiate use disorder, ordered iron studies, plan to supplement if needed. Follow-up iron studies  Elevated Creatinine : -Is down trending over last 4 days- 1.60 -> 1.30 -> 1.22  ->1.13 -Encourage fluid intake -No further labs required -Recommended outpatient PCP follow-up   -Continue PRN's: Maalox, Atarax, [place] of [place], DO 04/05/2022 12:28 PM

## 2022-04-05 NOTE — ED Notes (Signed)
Pt came to desk requesting to have a phone number retrieved from her locker. I explained to her security was doing shift change at this moment and staff was unable to leave the floor

## 2022-04-05 NOTE — ED Notes (Signed)
Pt requested for medication to help with anxiety. 

## 2022-04-05 NOTE — ED Notes (Signed)
Pt is in the dayroom watching TV.  Respirations are even and unlabored. No acute distress noted. Will continue to monitor for safety. 

## 2022-04-05 NOTE — ED Notes (Signed)
Patient A&Ox4. Denies intent to harm self/others when asked. Denies A/VH. Patient denies any physical complaints when asked. No acute distress noted. Routine safety checks conducted according to facility protocol. Encouraged patient to notify staff if thoughts of harm toward self or others arise. Patient verbalize understanding and agreement. Will continue to monitor for safety.    

## 2022-04-05 NOTE — ED Notes (Signed)
Pt is in the bed sleeping. Respirations are even and unlabored. No acute distress noted. Will continue to monitor for safety. 

## 2022-04-05 NOTE — ED Notes (Signed)
Pt sleeping in no acute distress. RR even and unlabored. Safety maintained. 

## 2022-04-05 NOTE — ED Notes (Signed)
Pt came to nurse and requested for medication to help with anxiety.

## 2022-04-06 ENCOUNTER — Encounter (HOSPITAL_COMMUNITY): Payer: Self-pay | Admitting: Urology

## 2022-04-06 DIAGNOSIS — F1990 Other psychoactive substance use, unspecified, uncomplicated: Secondary | ICD-10-CM | POA: Diagnosis not present

## 2022-04-06 DIAGNOSIS — F1124 Opioid dependence with opioid-induced mood disorder: Secondary | ICD-10-CM | POA: Diagnosis not present

## 2022-04-06 DIAGNOSIS — F1123 Opioid dependence with withdrawal: Secondary | ICD-10-CM | POA: Diagnosis not present

## 2022-04-06 DIAGNOSIS — R45851 Suicidal ideations: Secondary | ICD-10-CM | POA: Diagnosis not present

## 2022-04-06 DIAGNOSIS — B9689 Other specified bacterial agents as the cause of diseases classified elsewhere: Secondary | ICD-10-CM | POA: Diagnosis not present

## 2022-04-06 MED ORDER — OLANZAPINE 5 MG PO TABS: 5.0000 mg | ORAL_TABLET | Freq: Every day | ORAL | 0 refills | Status: DC

## 2022-04-06 MED ORDER — PRAZOSIN HCL 1 MG PO CAPS: 1.0000 mg | ORAL_CAPSULE | Freq: Every day | ORAL | 0 refills | Status: DC

## 2022-04-06 MED ORDER — NICOTINE 21 MG/24HR TD PT24: 21.0000 mg | MEDICATED_PATCH | Freq: Every day | TRANSDERMAL | Status: DC | PRN

## 2022-04-06 MED ORDER — METRONIDAZOLE 250 MG PO TABS
500.0000 mg | ORAL_TABLET | Freq: Two times a day (BID) | ORAL | Status: DC
  Administered 2022-04-06: 500 mg via ORAL
  Filled 2022-04-06: qty 2

## 2022-04-06 MED ORDER — METRONIDAZOLE 500 MG PO TABS: 500.0000 mg | ORAL_TABLET | Freq: Two times a day (BID) | ORAL | 0 refills | Status: AC

## 2022-04-06 MED ORDER — MIRTAZAPINE 7.5 MG PO TABS: 7.5000 mg | ORAL_TABLET | Freq: Every day | ORAL | 0 refills | Status: DC

## 2022-04-06 MED ORDER — OLANZAPINE 5 MG PO TABS
5.0000 mg | ORAL_TABLET | Freq: Every day | ORAL | Status: DC
  Filled 2022-04-06: qty 7

## 2022-04-06 NOTE — Discharge Instructions (Addendum)
Dear Jessica Walton,  Most effective treatment for your mental health disease involves BOTH a psychiatrist AND a therapist Psychiatrist to manage medications Therapist to help identify personal goals, barriers from those goals, and plan to achieve those goals by understanding emotions Please make regular appointments with an outpatient psychiatrist and other doctors once you leave the hospital (if any, otherwise, please see below for resources to make an appointment).  For therapy outside the hospital, please ask for these specific types of therapy: DBT ________________________________________________________  SAFETY CRISIS  Dial 988 for National Suicide & Crisis Lifeline    Text [phone] for Crisis Text Line:     [facility] URGENT CARE:  [address] FIRST FLOOR.  [place], [place] [address].  [phone]  Mobile Crisis Response Teams Listed by counties in vicinity of [facility] providers [facility] [phone] [facility] [phone] [facility] [phone] [facility] [phone] [place]                * [facility] [phone]                * [facility] [phone] [facility] [phone] [facility]  [phone] * [facility] [phone] ________________________________________________________  To see which pharmacy near you is the CHEAPEST for certain medications, please use GoodRx. It is free website and has a free phone app.    Also consider looking at [facility] $4.00 or [facility]'s $7.00 prescription list. Both are free to view if googled "walmart $4 prescription" and "public's $7 prescription". These are set prices, no insurance required. [facility]'s low cost medications: $4-$15 for 30days  prescriptions or $10-$38 for 90days prescriptions  ________________________________________________________  Difficulties with sleep?   Can also use this free app for insomnia called CBT-I. Let your doctors and therapists know so they can help with extra tips and tricks or for guidance and accountability. NO ADDS on the app.     ________________________________________________________  Non-Emergent / Urgent  [facility] [address] SECOND FLOOR [place], [place] [address] [phone] OUTPATIENT Walk-in information: Please note, all walk-ins are first come & first serve, with limited number of availability.  Please note that to be eligible for services you must bring: ID or a piece of mail with your name [facility] address  Therapist for therapy:  Monday & Wednesdays: Please ARRIVE at 7:15 AM for registration Will START at 8:00 AM Every 1st & 2nd Friday of the month: Please ARRIVE at 10:15 AM for registration Will START at 1 PM - 5 PM  Psychiatrist for medication management: Monday - Friday:  Please ARRIVE at 7:15 AM for registration Will START at 8:00 AM  Regretfully, due to limited availability, please be aware that you may not been seen on the same day as walk-in. Please consider making an appoint or try again. Thank you for your patience and understanding.     A referral has been made on your behalf to Jessica Walton, [facility], for the CD-IOP (Chemical Dependency-Intensive Outpatient Program).  She will be contacting you sometime either today or tomorrow after she reviews your record to set up a time to speak with you to complete the intake and assessment.  It is as this time that you can ask her questions about how the program will benefit you.  It is a higher level of treatment from basic outpatient therapy, and a standard procedure  when stepping down from another higher level of care such as the [facility] or inpatient hospitalization.     CHARITABLE RESIDENTIAL REHABS  [facility] [address] (women only at this campus) [place]. [address] [address] [place], [place] [address] [phone]  ((This program has a one-time application fee.))   [address] [place] [address] [phone]  [facility] [address], [place] [address] [phone], [place] [address] [phone]     MEDICAL DETOX- MEDICAID/IPRS:   [facility] [address] [place]. [place], [place] [address] [phone]  [facility] [address] [place], [place] [address] [phone]  [facility] [address] [place], [place] [address] [phone]  ((For admissions to these three [facility] facilities during weekday days and possibly other times, contact [place], phone: [phone]; fax: [phone]))  [facility] (detox for men and women (detox only for females)) [address] [place], [place] [address] [phone]   OUTPATIENT PROGRAMS:  [facility] (ADS) [address], [place] [address] [phone]  ((CD-IOP is currently not operational; Opioid replacement clinic is operational))   [facility] (Medicaid & IPRS for [facility] residents) [address] [place], [place] [address] [phone]  ((CD-IOP; new clients must go through walk-in clinic))   [facility] (Medicaid & IPRS for [facility] residents) [address] [place], [place] [address] [phone]  ((CD-IOP))   [facility] (Medicaid for [employer] and [employer]; some private insurance) [address] [place], [place] [address] [phone]  ((CD-IOP))   [facility] & private insurance) [phone] [address] [place], [place] [address] [phone]  ((CD-IOP (morning & evening programs); individual  therapy))   HALFWAY HOUSES:  [facility] [phone]  [employer].oxfordvacancies.com   OPIOID REPLACEMENT PROVIDERS:  [facility] ([facility]) [address], [place] [address] [phone]  [facility] [address] [place], [place] [address] [phone]  [facility] [address] [place], [place] [address] [phone]   12 STEP PROGRAMS:  Alcoholics Anonymous of [place] [employer].be  Narcotics Anonymous of [place] [employer]ct.dk  Al-Anon of  [place], [place] www.g[place]alanon.org/find-meetings.html  Nar-Anon https://nar-anon.org/find-a-meetin   Based on what you have shared, a list of resources for outpatient therapy and psychiatry is provided below to get you started back on treatment.  It is imperative that you follow through with treatment within 5-7 days from the day of discharge to prevent any further risk to your safety or mental well-being.  You are not limited to the list provided.  In case of an urgent crisis, you may contact the Mobile Crisis Unit with [facility] at [phone].        Outpatient Services for Therapy and Medication Management for [facility] [address], [place], [address] [phone] phone  New Patient Assessment/Therapy Walk-ins Monday and Wednesday: 8am until slots are full. Every 1st and 2nd Friday: 1pm - 5pm  NO ASSESSMENT/THERAPY WALK-INS ON TUESDAYS OR THURSDAYS  New Patient Psychiatry/Medication Management Walk-ins Monday-Friday: 8am-11am  For all walk-ins, we ask that you arrive by 7:30am because patient will be seen in the order of arrival.  Availability is limited; therefore, you may not be seen on the same day that you walk-in.  Our goal is to serve  and meet the needs of our community to the best of our ability.   [facility] [address] [place], [place], [address] [phone] phone  [facility] [address] [place], [place], [address] [address]5Th [address] phone ([address], [employer] [address] - [place], [address], [place], [place], [employer], [address], [employer] Healthy [place], [place], [address], [place], [place], [place], [place], [employer], U[employer], [employer], [place])  [facility] [address] [place], [place], [address] [phone] phone  [facility] [address] [place], [place], [address] [phone] phone  [facility] [address] [place], [place], [address] [phone] phone  [facility] [address], [place], [address] [phone] phone  [facility] [place] [address], [place], [address] [phone] phone  [facility] [address] [place], [place], [address] [phone] phone [phone] fax  [facility] [address] [place], [place], [address] [phone] phone [phone] fax  [facility] [phone] [address] [place], [place], [address] [phone] phone  Jovita Kussmaul [address] [place], [place], [address]  [phone] phone  [facility] [address] [place], [place], [address]  [phone] phone [phone] fax

## 2022-04-06 NOTE — ED Notes (Signed)
Patient A&O x 4, ambulatory. Patient discharged in no acute distress. Patient denied SI/HI, A/VH upon discharge. Patient verbalized understanding of all discharge instructions explained by staff, to include follow up appointments, RX's and safety plan. Pt belongings returned to patient from locker #23 intact. Patient escorted to lobby via staff for transport to destination. Safety maintained.

## 2022-04-06 NOTE — ED Notes (Signed)
Pt is in the bed sleeping. Respirations are even and unlabored. No acute distress noted. Will continue to monitor for safety. 

## 2022-04-06 NOTE — BH Specialist Note (Signed)
LCSW Progress Note  LCSW included several resources for substance use providers (detox and outpatient).  LCSW sent a referral to Maeola Sarah, [facility], to be enrolled in the CD-IOP group.  Pt left the facility with her brother before the LCSW could complete the resources in her AVS.  LCSW is informed that she will return to get her list of resources.  Hansel Starling, MSW, LCSW [facility] [phone] mobile work phone [phone] phone

## 2022-04-06 NOTE — ED Notes (Signed)
Patient A&Ox4. Denies intent to harm self/others when asked. Denies A/VH. Patient denies any physical complaints when asked. No acute distress noted. Pt request to discharge this am. Informed pt that I would notify provider upon arrival. Pt verbalized understanding and agreement. Routine safety checks conducted according to facility protocol. Encouraged patient to notify staff if thoughts of harm toward self or others arise. Patient verbalize understanding and agreement. Will continue to monitor for safety.

## 2022-04-06 NOTE — ED Notes (Signed)
When getting vitals this morning Pt. Was up and stated she was ready to leave. She asked for her phone and we told her that they were in the middle of shift change to ask after 8am. She wanted a number out so she can go home.

## 2022-04-06 NOTE — ED Provider Notes (Signed)
FBC/OBS ASAP Discharge Summary  Date and Time: 04/06/2022 7:51 AM  Name: Jessica Walton  Age: 36 y.o.  DOB: 04-30-1986  MRN:  [id number]   Discharge Diagnoses:  Final diagnoses:  Substance induced mood disorder (HCC)  Opioid dependence with withdrawal (HCC)    HPI: Jessica Walton is a 36 y.o. female who presented to [facility] on 04/01/22 requesting detox from fentanyl and treatment for worsening depressive symptoms, she was admitted to [facility] on 9/9.  PPHx is significant for Polysubstance Use Disorder, Bipolar Disorder, Depression, and Anxiety.  Per H&P: " Jessica Walton is a 36 year old female with reported past psychiatric history of polysubstance use disorder, bipolar, depression, and anxiety. Patient presented voluntarily to [facility] on 04/01/22 requesting detox from fentanyl and treatment for worsening depressive symptoms. Patient was admitted to the [facility] continuous assessment unit and recommended for [facility].    Today, patient seen and evaluated face-to-face by this provider, and chart reviewed. On evaluation, patient is alert and oriented x 4. Her thought process is logical and relevant. Her speech is clear and coherent at a decreased tone. Her mood is irritable, depressed, and anxious. Her affect is appropriate.   Patient denies suicidal ideations. She reports multiple suicide attempts over the years by attempting to cut her wrists or take pills. She denies self-injurious behaviors. She denies homicidal ideations. She denies auditory and visual hallucinations. There is no objective evidence that the patient is currently responding to internal or external stimuli.   She states that she still feels irritated today. She reports feeling irritated for the past week. She reports worsening depressive symptoms for the past week that she describes as feelings of sadness, hopelessness, worthlessness, and isolating. She reports experiencing episodes of depression since 11/08/13 when her baby died at birth and  a month later her husband was killed. She states that she also loss another child in 08-Nov-2017. She states that she never received grief counseling because she was too busy trying to get her 3 kids counseling at that time. She reports difficulty sleeping at night due to nightmares and racing thoughts for a long time. She reports a normal appetite. She reports lots of anxiety that she rates 10/10 with 10 being the worst. She describes her anxiety as worrying, and anxious.   She reports opiate withdrawal symptoms sweats, headaches, runny nose, weird dreams, and nightmares. She reports using fentanyl for the past 4 years, on average every day. She denies currently taking prescribed medications. She denies outpatient psychiatric services. She reports a medical history of asthma. "  Subjective:  Patient initially stated that she wanted to leave due to nicotine craving. Patient denied si/hi/avh, contracted to safety, said that her brother is here to pick her up. Denied access to guns or weapons, discussed 988, 911. Patient stated that she will come back to pick up her med samples.    Today, patient denied SI/HI/AVH, delusions, paranoia, first rank symptoms.  Review of Systems  Constitutional:  Positive for malaise/fatigue.  Respiratory:  Negative for shortness of breath.   Cardiovascular:  Negative for chest pain.  Gastrointestinal:  Negative for nausea and vomiting.  Musculoskeletal:  Positive for myalgias.  Neurological:  Negative for dizziness, tremors and headaches.  Psychiatric/Behavioral:  Negative for depression, hallucinations and suicidal ideas. The patient is nervous/anxious and has insomnia.      Stay Summary:  Jessica Walton is a 36 y.o. female who presented to [facility] on 04/01/22 requesting detox from fentanyl and treatment for worsening  depressive symptoms, [facility]  PPHx is significant for Polysubstance Use Disorder, Bipolar Disorder, Depression, and Anxiety.  Bipolar  Disorder  MDD  GAD  PTSD: Reported having MDD/GAD prior to substance use.  States she initially started using in 2015 after the death of her child and her husband later that same year.  Stated she used opiates to help her fall asleep and avoid PTSD nightmares and to help with withdrawal.  Stated that Seroquel was still ineffective and requested Ambien stated that trazodone did not work for her.  Concern for med seeking behaviors.  Discussed prazosin and Remeron, patient was amenable.  BP 115/74, will monitor for symptoms of orthostasis and hypotension.  Patient continues to endorse inefficiency of Seroquel, stated that it makes her feel restless, requested that it be discontinued. Will exchange seroquel to another second generation antipsychotic.  -Discontinued Seroquel to 150 mg QHS tonight for mood stability and sleep -Continued Remeron 7.5 mg nightly as sleep adjunct -Continued prazosin 1 mg nightly for PTSD nightmares -Repeat EKG, since last EKG was done prior to starting and up titrating antipsychotic - patient declined -Continued zyprexa 5 m qHS  Opiate Use Disorder: Denied IVDU, and declined high risk screening of HIV, RPR, and hepatitis at this time.  Continued to have opiate withdrawal symptoms, given patient's blood pressure of 120s/80s, will increase clonidine taper to assist with withdrawal symptoms. COWS: 1  -Continue COWS -Continue Imodium 2-4 mg PRN diarrhea -Continue Robaxin 500 mg q8 PRN muscle spasms -Continue Naproxen 500 mg BID PRN pain -Continue Zofran-ODT 4 mg q6 PRN nausea -Continue Bentyl 20 mg q6 PRN spasms -Continue Thiamine 100 mg daily for nutritional supplementation -Continue Multivitamin daily for nutritional supplementation -Plan to discuss naltrexone prior to dc -Clonidine taper 0.2 mg (ends on 9/15)  Restless legs (resolved) Suspect secondary to opiate use disorder, ordered iron studies, plan to supplement if needed. Iron studies wnl.   Elevated Creatinine  : -Is down trending over last 4 days- 1.60 -> 1.30 -> 1.22 ->1.13 -Encourage fluid intake -No further labs required -Recommended outpatient PCP follow-up  Rx initial: Seroquel 100 mg qHS,  Rx changes: Started prazosin, remeron. Switched from seroquel to zyprexa due to patient preference Rx at dc: Zyprexa 5 mg qHS, prazosin 1 mg, remeron 7.5 mg  Dispo: Home with 7 day samples and referral to IOP    Past Psychiatric History: Per H&P Past Medical History:  Past Medical History:  Diagnosis Date   Asthma    No inhaler use x 1 yr (2013)   Bipolar 1 disorder (HCC)    Cocaine abuse (HCC) 2018   Headache    History of suicide attempt 2015   Hypertension    Lordosis    Opiate abuse (HCC) 2018   PTSD (post-traumatic stress disorder)    Seizures (HCC)    Tetrahydrocannabinol (THC) dependence (HCC)     Past Surgical History:  Procedure Laterality Date   EYE SURGERY     WISDOM TOOTH EXTRACTION     x4   Family History:  Family History  Problem Relation Age of Onset   Hypertension Maternal Grandmother    Diabetes Maternal Grandmother    Seizures Neg Hx    Family Psychiatric History: Per H&P Social History:  Social History   Substance and Sexual Activity  Alcohol Use Yes   Alcohol/week: 0.0 standard drinks of alcohol   Comment: occasional     Social History   Substance and Sexual Activity  Drug  Use No    Social History   Socioeconomic History   Marital status: Single    Spouse name: Not on file   Number of children: Not on file   Years of education: 14   Highest education level: Not on file  Occupational History   Not on file  Tobacco Use   Smoking status: Some Days    Packs/day: 0.50    Years: 10.00    Total pack years: 5.00    Types: Cigarettes   Smokeless tobacco: Never  Substance and Sexual Activity   Alcohol use: Yes    Alcohol/week: 0.0 standard drinks of alcohol    Comment: occasional   Drug use: No   Sexual activity: Yes  Other Topics Concern    Not on file  Social History Narrative   Lives at home with family.   Caffeine use: none   Social Determinants of Health   Financial Resource Strain: Not on file  Food Insecurity: Not on file  Transportation Needs: Not on file  Physical Activity: Not on file  Stress: Not on file  Social Connections: Not on file   SDOH:  SDOH Screenings   Depression (PHQ2-9): High Risk (04/03/2022)  Tobacco Use: High Risk (04/05/2022)   Tobacco Cessation:  A prescription for an FDA-approved tobacco cessation medication provided at discharge  Current Medications:  Current Facility-Administered Medications  Medication Dose Route Frequency Provider Last Rate Last Admin   alum & mag hydroxide-simeth (MAALOX/MYLANTA) 200-200-20 MG/5ML suspension 30 mL  30 mL Oral Q4H PRN Ajibola, Ene A, NP       cloNIDine (CATAPRES) tablet 0.2 mg  0.2 mg Oral BH-qamhs Princess Bruins, DO       Followed by   Melene Muller ON 04/07/2022] cloNIDine (CATAPRES) tablet 0.2 mg  0.2 mg Oral QAC breakfast Princess Bruins, DO       dicyclomine (BENTYL) tablet 20 mg  20 mg Oral Q6H PRN Ajibola, Ene A, NP   20 mg at 04/03/22 5188   hydrOXYzine (ATARAX) tablet 50 mg  50 mg Oral Q6H PRN Princess Bruins, DO   50 mg at 04/05/22 1957   loperamide (IMODIUM) capsule 2-4 mg  2-4 mg Oral PRN Ajibola, Ene A, NP       magnesium hydroxide (MILK OF MAGNESIA) suspension 30 mL  30 mL Oral Daily PRN Ajibola, Ene A, NP       methocarbamol (ROBAXIN) tablet 500 mg  500 mg Oral Q8H PRN Ajibola, Ene A, NP   500 mg at 04/03/22 4166   mirtazapine (REMERON) tablet 7.5 mg  7.5 mg Oral QHS Princess Bruins, DO   7.5 mg at 04/05/22 2120   naproxen (NAPROSYN) tablet 500 mg  500 mg Oral BID PRN Ajibola, Ene A, NP   500 mg at 04/03/22 0931   nicotine polacrilex (NICORETTE) gum 4 mg  4 mg Oral PRN Princess Bruins, DO   4 mg at 04/05/22 2126   ondansetron (ZOFRAN-ODT) disintegrating tablet 4 mg  4 mg Oral Q6H PRN Ajibola, Ene A, NP       prazosin (MINIPRESS) capsule 1 mg  1 mg Oral  QHS Princess Bruins, DO   1 mg at 04/04/22 2125   Current Outpatient Medications  Medication Sig Dispense Refill   cloNIDine (CATAPRES) 0.1 MG tablet Take 1 tablet (0.1 mg total) by mouth 2 (two) times daily. 20 tablet 0    PTA Medications: (Not in a hospital admission)     04/03/2022    5:05 PM 04/02/2022  2:01 PM  Depression screen PHQ 2/9  Decreased Interest 3 3  Down, Depressed, Hopeless 3 0  PHQ - 2 Score 6 3  Altered sleeping 3 1  Tired, decreased energy 2 1  Change in appetite 1 1  Feeling bad or failure about yourself  3 3  Trouble concentrating 3 3  Moving slowly or fidgety/restless 3 3  Suicidal thoughts 3 3  PHQ-9 Score 24 18  Difficult doing work/chores Very difficult Somewhat difficult    Flowsheet Row ED from 04/01/2022 in [facility] ED from 03/30/2022 in [facility] EMERGENCY DEPARTMENT ED from 03/16/2021 in [facility] RISK CATEGORY No Risk No Risk No Risk       Musculoskeletal  Strength & Muscle Tone: within normal limits Gait & Station: normal Patient leans: N/A   Psychiatric Specialty Exam   Presentation  General Appearance:Appropriate for Environment, Casual, Fairly Groomed Eye Contact:Minimal Speech:Clear and Coherent, Normal Rate Volume:Normal Handedness:Right  Mood and Affect  Mood:Dysphoric, Anxious Affect:Congruent, Appropriate, Full Range  Thought Process  Thought Process:Goal Directed, Coherent, Linear Descriptions of Associations:Intact  Thought Content Suicidal Thoughts:Suicidal Thoughts: No Homicidal Thoughts:Homicidal Thoughts: No Hallucinations:Hallucinations: None Ideas of Reference:None Thought Content:Rumination (Nicotine craving, wants to be able to sleep at night.  Denied nightmares, but was unable to sleep last night due to opiate withdrawal symptoms.)  Sensorium  Memory:Immediate Good Judgment:Fair Insight:Shallow  Executive Functions   Orientation:Full (Time, Place and Person) Language:Good Concentration:Fair Attention:Fair Recall:Good Fund of Knowledge:Good  Psychomotor Activity  Psychomotor Activity:Psychomotor Activity: Normal  Assets  Assets:Communication Skills, Desire for Improvement, Resilience, Financial Resources/Insurance  Sleep  Quality:Poor (Interrupted by opiate withdrawal symptoms, no nightmares)  Physical Exam  BP 131/84 (BP Location: Right Arm)   Pulse 80   Temp 99 F (37.2 C) (Oral)   Resp 18   LMP 03/07/2022   SpO2 100%   Physical Exam Vitals and nursing note reviewed.  Constitutional:      General: She is awake. She is not in acute distress.    Appearance: She is not ill-appearing or diaphoretic.  HENT:     Head: Normocephalic.  Pulmonary:     Effort: Pulmonary effort is normal. No respiratory distress.  Neurological:     General: No focal deficit present.     Mental Status: She is alert.  Psychiatric:        Behavior: Behavior is cooperative.     Demographic Factors:  Low socioeconomic status and Unemployed  Loss Factors: Decrease in vocational status and Financial problems/change in socioeconomic status  Historical Factors: Prior suicide attempts and Impulsivity  Risk Reduction Factors:   Responsible for children under 63 years of age, Sense of responsibility to family, Religious beliefs about death, Living with another person, especially a relative, and Positive social support  Continued Clinical Symptoms:  Alcohol/Substance Abuse/Dependencies  Cognitive Features That Contribute To Risk:  Closed-mindedness, Loss of executive function, Polarized thinking, and Thought constriction (tunnel vision)    Suicide Risk:  Mild:  Suicidal ideation of limited frequency, intensity, duration, and specificity.  There are no identifiable plans, no associated intent, mild dysphoria and related symptoms, good self-control (both objective and subjective assessment), few other risk  factors, and identifiable protective factors, including available and accessible social support.  Plan Of Care/Follow-up recommendations:  Activity and diet at tolerated.  Please: Take all medications as prescribed by your mental healthcare provider. Report any adverse effects and or reactions from the medicines to your  outpatient provider promptly. Do not engage in alcohol and or illegal drug use while on prescription medicines.  Disposition: home with brother (who picked up)   Total Time spent with patient: 45 minutes  Signed: Princess Bruins, DO Psychiatry Resident, PGY-2 [facility] 04/06/2022, 7:51 AM

## 2022-04-06 NOTE — ED Provider Notes (Signed)
Behavioral Health Progress Note  Date and Time: 04/06/2022 11:33 AM Name: Jessica Walton MRN:  [id number]  Subjective:   Jessica Walton is a 36 yr old female who [facility] requesting detox from fentanyl and treatment for worsening depressive symptoms, [facility]  PPHx is significant for Polysubstance Use Disorder, Bipolar Disorder, Depression, and Anxiety. Total duration of encounter: 5 days   Patient initially stated that she wanted to leave due to nicotine craving. Patient denied si/hi/avh, contracted to safety, said that her brother is here to pick her up. Denied access to guns or weapons, discussed 988, 911. Patient stated that she will come back to pick up her med samples.   ROS: SI:No Without Plan; Without Intent HI:No NWG:NFAOAVH:None  Mood: Dysphoric; Anxious Sleep:Poor (Interrupted by opiate withdrawal symptoms, no nightmares) Appetite: Fair   Diagnosis:  Final diagnoses:  Substance induced mood disorder (HCC)  Opioid dependence with withdrawal (HCC)  BV (bacterial vaginosis)  Opioid use disorder, severe, dependence (HCC)  History of suicide attempt  Tetrahydrocannabinol (THC) dependence (HCC)  PTSD (post-traumatic stress disorder)    Total Time spent with patient: 15 minutes  Past Psychiatric History: Polysubstance Use Disorder, Bipolar Disorder, Depression, and Anxiety.  Denied history of suicide attempt.  History of inpatient psychiatric stay on 2016 for she presumed substance-induced mania Past Medical History:  Past Medical History:  Diagnosis Date   Asthma    No inhaler use x 1 yr (2013)   Bipolar 1 disorder (HCC)    Cocaine abuse (HCC) 2018   Headache    History of suicide attempt 2015   Hypertension    Lordosis    Opiate abuse (HCC) 2018   Opioid use disorder, severe, dependence (HCC) 07/25/2016   PTSD (post-traumatic stress disorder)    Seizures (HCC)    Tetrahydrocannabinol (THC) dependence (HCC)     Past Surgical  History:  Procedure Laterality Date   EYE SURGERY     WISDOM TOOTH EXTRACTION     x4   Family History:  Family History  Problem Relation Age of Onset   Hypertension Maternal Grandmother    Diabetes Maternal Grandmother    Seizures Neg Hx    Family Psychiatric  History: Mother- Bipolar Disorder Social History:  Social History   Substance and Sexual Activity  Alcohol Use Yes   Alcohol/week: 0.0 standard drinks of alcohol   Comment: occasional     Social History   Substance and Sexual Activity  Drug Use No    Social History   Socioeconomic History   Marital status: Single    Spouse name: Not on file   Number of children: Not on file   Years of education: 14   Highest education level: Not on file  Occupational History   Not on file  Tobacco Use   Smoking status: Some Days    Packs/day: 0.50    Years: 10.00    Total pack years: 5.00    Types: Cigarettes   Smokeless tobacco: Never  Substance and Sexual Activity   Alcohol use: Yes    Alcohol/week: 0.0 standard drinks of alcohol    Comment: occasional   Drug use: No   Sexual activity: Yes  Other Topics Concern   Not on file  Social History Narrative   Lives at home with family.   Caffeine use: none   Social Determinants of Corporate investment bankerHealth   Financial Resource Strain: Not on file  Food Insecurity: Not on file  Transportation Needs: Not on file  Physical Activity: Not on file  Stress: Not on file  Social Connections: Not on file   SDOH:  SDOH Screenings   Depression (PHQ2-9): High Risk (04/03/2022)  Tobacco Use: High Risk (04/06/2022)   Additional Social History:    Pain Medications: See MAR Prescriptions: See MAR Over the Counter: See MAR History of alcohol / drug use?: Yes Name of Substance 1: Fentanyl. 1 - Age of First Use: Pt reports, four years ago 1 - Amount (size/oz): Pt reports, sniffing a gram of Fentanyl three days ago. 1 - Frequency: Pt reports, everyday. 1 - Duration: Ongoing. 1 - Last Use /  Amount: Three days ago. 1 - Method of Aquiring: Purchase. 1- Route of Use: Sniff.                   Current Medications:  Current Facility-Administered Medications  Medication Dose Route Frequency Provider Last Rate Last Admin   alum & mag hydroxide-simeth (MAALOX/MYLANTA) 200-200-20 MG/5ML suspension 30 mL  30 mL Oral Q4H PRN Ajibola, Ene A, NP       cloNIDine (CATAPRES) tablet 0.2 mg  0.2 mg Oral BH-qamhs Princess Bruins, DO   0.2 mg at 04/06/22 0900   Followed by   Melene Muller ON 04/07/2022] cloNIDine (CATAPRES) tablet 0.2 mg  0.2 mg Oral QAC breakfast Princess Bruins, DO       dicyclomine (BENTYL) tablet 20 mg  20 mg Oral Q6H PRN Ajibola, Ene A, NP   20 mg at 04/03/22 6270   hydrOXYzine (ATARAX) tablet 50 mg  50 mg Oral Q6H PRN Princess Bruins, DO   50 mg at 04/05/22 1957   loperamide (IMODIUM) capsule 2-4 mg  2-4 mg Oral PRN Ajibola, Ene A, NP       magnesium hydroxide (MILK OF MAGNESIA) suspension 30 mL  30 mL Oral Daily PRN Ajibola, Ene A, NP       methocarbamol (ROBAXIN) tablet 500 mg  500 mg Oral Q8H PRN Ajibola, Ene A, NP   500 mg at 04/03/22 3500   metroNIDAZOLE (FLAGYL) tablet 500 mg  500 mg Oral Q12H Princess Bruins, DO   500 mg at 04/06/22 0940   mirtazapine (REMERON) tablet 7.5 mg  7.5 mg Oral QHS Princess Bruins, DO   7.5 mg at 04/05/22 2120   naproxen (NAPROSYN) tablet 500 mg  500 mg Oral BID PRN Ajibola, Ene A, NP   500 mg at 04/03/22 0931   nicotine (NICODERM CQ - dosed in mg/24 hours) patch 21 mg  21 mg Transdermal Daily PRN Princess Bruins, DO       nicotine polacrilex (NICORETTE) gum 4 mg  4 mg Oral PRN Princess Bruins, DO   4 mg at 04/05/22 2126   OLANZapine (ZYPREXA) tablet 5 mg  5 mg Oral QHS Princess Bruins, DO       ondansetron (ZOFRAN-ODT) disintegrating tablet 4 mg  4 mg Oral Q6H PRN Ajibola, Ene A, NP       prazosin (MINIPRESS) capsule 1 mg  1 mg Oral QHS Princess Bruins, DO   1 mg at 04/04/22 2125   Current Outpatient Medications  Medication Sig Dispense Refill    metroNIDAZOLE (FLAGYL) 500 MG tablet Take 1 tablet (500 mg total) by mouth every 12 (twelve) hours for 7 days. 14 tablet 0   mirtazapine (REMERON) 7.5 MG tablet Take 1 tablet (7.5 mg total) by mouth at bedtime. 30 tablet 0   OLANZapine (ZYPREXA) 5 MG tablet Take 1 tablet (5  mg total) by mouth at bedtime. 30 tablet 0   prazosin (MINIPRESS) 1 MG capsule Take 1 capsule (1 mg total) by mouth at bedtime. 30 capsule 0    Labs  Lab Results:  Admission on 04/01/2022, Discharged on 04/06/2022  Component Date Value Ref Range Status   SARS Coronavirus 2 by RT PCR 04/02/2022 NEGATIVE  NEGATIVE Final   Comment: (NOTE) SARS-CoV-2 target nucleic acids are NOT DETECTED.  The SARS-CoV-2 RNA is generally detectable in upper respiratory specimens during the acute phase of infection. The lowest concentration of SARS-CoV-2 viral copies this assay can detect is 138 copies/mL. A negative result does not preclude SARS-Cov-2 infection and should not be used as the sole basis for treatment or other patient management decisions. A negative result may occur with  improper specimen collection/handling, submission of specimen other than nasopharyngeal swab, presence of viral mutation(s) within the areas targeted by this assay, and inadequate number of viral copies(<138 copies/mL). A negative result must be combined with clinical observations, patient history, and epidemiological information. The expected result is Negative.  Fact Sheet for Patients:  BloggerCourse.com  Fact Sheet for Healthcare Providers:  SeriousBroker.it  This test is no                          t yet approved or cleared by the [place] FDA and  has been authorized for detection and/or diagnosis of SARS-CoV-2 by FDA under an Emergency Use Authorization (EUA). This EUA will remain  in effect (meaning this test can be used) for the duration of the COVID-19 declaration under Section  564(b)(1) of the Act, 21 U.S.C.section 360bbb-3(b)(1), unless the authorization is terminated  or revoked sooner.       Influenza A by PCR 04/02/2022 NEGATIVE  NEGATIVE Final   Influenza B by PCR 04/02/2022 NEGATIVE  NEGATIVE Final   Comment: (NOTE) The Xpert Xpress SARS-CoV-2/FLU/RSV plus assay is intended as an aid in the diagnosis of influenza from Nasopharyngeal swab specimens and should not be used as a sole basis for treatment. Nasal washings and aspirates are unacceptable for Xpert Xpress SARS-CoV-2/FLU/RSV testing.  Fact Sheet for Patients: BloggerCourse.com  Fact Sheet for Healthcare Providers: SeriousBroker.it  This test is not yet approved or cleared by the [place] FDA and has been authorized for detection and/or diagnosis of SARS-CoV-2 by FDA under an Emergency Use Authorization (EUA). This EUA will remain in effect (meaning this test can be used) for the duration of the COVID-19 declaration under Section 564(b)(1) of the Act, 21 U.S.C. section 360bbb-3(b)(1), unless the authorization is terminated or revoked.  Performed at [facility], [address] [place], [place] [address]    WBC 04/02/2022 5.2  4.0 - 10.5 K/uL Final   RBC 04/02/2022 5.08  3.87 - 5.11 MIL/uL Final   Hemoglobin 04/02/2022 15.0  12.0 - 15.0 g/dL Final   HCT 60/45/4098 43.7  36.0 - 46.0 % Final   MCV 04/02/2022 86.0  80.0 - 100.0 fL Final   MCH 04/02/2022 29.5  26.0 - 34.0 pg Final   MCHC 04/02/2022 34.3  30.0 - 36.0 g/dL Final   RDW 11/91/4782 15.8 (H)  11.5 - 15.5 % Final   Platelets 04/02/2022 296  150 - 400 K/uL Final   nRBC 04/02/2022 0.0  0.0 - 0.2 % Final   Neutrophils Relative % 04/02/2022 44  % Final   Neutro Abs 04/02/2022 2.2  1.7 - 7.7 K/uL Final   Lymphocytes  Relative 04/02/2022 51  % Final   Lymphs Abs 04/02/2022 2.6  0.7 - 4.0 K/uL Final   Monocytes Relative 04/02/2022 5  % Final   Monocytes Absolute 04/02/2022  0.3  0.1 - 1.0 K/uL Final   Eosinophils Relative 04/02/2022 0  % Final   Eosinophils Absolute 04/02/2022 0.0  0.0 - 0.5 K/uL Final   Basophils Relative 04/02/2022 0  % Final   Basophils Absolute 04/02/2022 0.0  0.0 - 0.1 K/uL Final   Immature Granulocytes 04/02/2022 0  % Final   Abs Immature Granulocytes 04/02/2022 0.01  0.00 - 0.07 K/uL Final   Performed at [facility], [address], [place], [place] [address]   Sodium 04/02/2022 139  135 - 145 mmol/L Final   Potassium 04/02/2022 3.5  3.5 - 5.1 mmol/L Final   Chloride 04/02/2022 104  98 - 111 mmol/L Final   CO2 04/02/2022 27  22 - 32 mmol/L Final   Glucose, Bld 04/02/2022 128 (H)  70 - 99 mg/dL Final   Glucose reference range applies only to samples taken after fasting for at least 8 hours.   BUN 04/02/2022 13  6 - 20 mg/dL Final   Creatinine, Ser 04/02/2022 1.22 (H)  0.44 - 1.00 mg/dL Final   Calcium 60/45/4098 9.0  8.9 - 10.3 mg/dL Final   Total Protein 11/91/4782 7.1  6.5 - 8.1 g/dL Final   Albumin 95/62/1308 4.0  3.5 - 5.0 g/dL Final   AST 65/78/4696 27  15 - 41 U/L Final   ALT 04/02/2022 26  0 - 44 U/L Final   Alkaline Phosphatase 04/02/2022 35 (L)  38 - 126 U/L Final   Total Bilirubin 04/02/2022 0.6  0.3 - 1.2 mg/dL Final   GFR, Estimated 04/02/2022 60 (L)  >60 mL/min Final   Comment: (NOTE) Calculated using the CKD-EPI Creatinine Equation (2021)    Anion gap 04/02/2022 8  5 - 15 Final   Performed at [facility], [address] [place], [place] [address]   Hgb A1c MFr Bld 04/02/2022 5.4  4.8 - 5.6 % Final   Comment: (NOTE) Pre diabetes:          5.7%-6.4%  Diabetes:              >6.4%  Glycemic control for   <7.0% adults with diabetes    Mean Plasma Glucose 04/02/2022 108.28  mg/dL Final   Performed at [facility], [address] [place], [place] [address]   Alcohol, Ethyl (B) 04/02/2022 <10  <10 mg/dL Final   Comment: (NOTE) Lowest detectable limit for serum alcohol is 10 mg/dL.  For  medical purposes only. Performed at [facility], [address], [place], [place] [address]    Cholesterol 04/02/2022 142  0 - 200 mg/dL Final   Triglycerides 72/53/6644 63  <150 mg/dL Final   HDL 03/47/4259 48  >40 mg/dL Final   Total CHOL/HDL Ratio 04/02/2022 3.0  RATIO Final   VLDL 04/02/2022 13  0 - 40 mg/dL Final   LDL Cholesterol 04/02/2022 81  0 - 99 mg/dL Final   Comment:        Total Cholesterol/HDL:CHD Risk Coronary Heart Disease Risk Table                     Men   Women  1/2 Average Risk   3.4   3.3  Average Risk       5.0   4.4  2 X Average Risk   9.6   7.1  3 X Average Risk  23.4   11.0        Use the calculated Patient Ratio above and the CHD Risk Table to determine the patient's CHD Risk.        ATP III CLASSIFICATION (LDL):  <100     mg/dL   Optimal  016-010  mg/dL   Near or Above                    Optimal  130-159  mg/dL   Borderline  932-355  mg/dL   High  >732     mg/dL   Very High Performed at [facility], [address], [place], [place] [address]    TSH 04/02/2022 0.503  0.350 - 4.500 uIU/mL Final   Comment: Performed by a 3rd Generation assay with a functional sensitivity of <=0.01 uIU/mL. Performed at [facility], [address] [place], [place] [address]    Preg Test, Ur 04/02/2022 NEGATIVE  NEGATIVE Final   Comment:        THE SENSITIVITY OF THIS METHODOLOGY IS >24 mIU/mL    POC Amphetamine UR 04/02/2022 None Detected  NONE DETECTED (Cut Off Level 1000 ng/mL) Final   POC Secobarbital (BAR) 04/02/2022 None Detected  NONE DETECTED (Cut Off Level 300 ng/mL) Final   POC Buprenorphine (BUP) 04/02/2022 None Detected  NONE DETECTED (Cut Off Level 10 ng/mL) Final   POC Oxazepam (BZO) 04/02/2022 None Detected  NONE DETECTED (Cut Off Level 300 ng/mL) Final   POC Cocaine UR 04/02/2022 None Detected  NONE DETECTED (Cut Off Level 300 ng/mL) Final   POC Methamphetamine UR 04/02/2022 None Detected  NONE DETECTED (Cut Off Level 1000  ng/mL) Final   POC Morphine 04/02/2022 None Detected  NONE DETECTED (Cut Off Level 300 ng/mL) Final   POC Methadone UR 04/02/2022 None Detected  NONE DETECTED (Cut Off Level 300 ng/mL) Final   POC Oxycodone UR 04/02/2022 None Detected  NONE DETECTED (Cut Off Level 100 ng/mL) Final   POC Marijuana UR 04/02/2022 Positive (A)  NONE DETECTED (Cut Off Level 50 ng/mL) Final   Sodium 04/05/2022 143  135 - 145 mmol/L Final   Potassium 04/05/2022 3.8  3.5 - 5.1 mmol/L Final   Chloride 04/05/2022 108  98 - 111 mmol/L Final   CO2 04/05/2022 27  22 - 32 mmol/L Final   Glucose, Bld 04/05/2022 85  70 - 99 mg/dL Final   Glucose reference range applies only to samples taken after fasting for at least 8 hours.   BUN 04/05/2022 22 (H)  6 - 20 mg/dL Final   Creatinine, Ser 04/05/2022 1.13 (H)  0.44 - 1.00 mg/dL Final   Calcium 37/62/8315 9.4  8.9 - 10.3 mg/dL Final   Total Protein 17/61/6073 7.7  6.5 - 8.1 g/dL Final   Albumin 71/12/2692 4.0  3.5 - 5.0 g/dL Final   AST 85/46/2703 18  15 - 41 U/L Final   ALT 04/05/2022 28  0 - 44 U/L Final   Alkaline Phosphatase 04/05/2022 39  38 - 126 U/L Final   Total Bilirubin 04/05/2022 0.6  0.3 - 1.2 mg/dL Final   GFR, Estimated 04/05/2022 >60  >60 mL/min Final   Comment: (NOTE) Calculated using the CKD-EPI Creatinine Equation (2021)    Anion gap 04/05/2022 8  5 - 15 Final   Performed at [facility], [address] [place], [place] [address]  Iron 04/05/2022 84  28 - 170 ug/dL Final   TIBC 16/04/9603 392  250 - 450 ug/dL Final   Saturation Ratios 04/05/2022 21  10.4 - 31.8 % Final   UIBC 04/05/2022 308  ug/dL Final   Performed at [facility], [address], [place], [place] [address]   Ferritin 04/05/2022 67  11 - 307 ng/mL Final   Performed at [facility], [address], [place], [place] [address]  Admission on 03/30/2022, Discharged on 03/30/2022  Component Date Value Ref Range Status   Sodium 03/30/2022 140  135 - 145 mmol/L Final    Potassium 03/30/2022 3.1 (L)  3.5 - 5.1 mmol/L Final   Chloride 03/30/2022 98  98 - 111 mmol/L Final   CO2 03/30/2022 24  22 - 32 mmol/L Final   Glucose, Bld 03/30/2022 140 (H)  70 - 99 mg/dL Final   Glucose reference range applies only to samples taken after fasting for at least 8 hours.   BUN 03/30/2022 29 (H)  6 - 20 mg/dL Final   Creatinine, Ser 03/30/2022 1.60 (H)  0.44 - 1.00 mg/dL Final   Calcium 78/29/5621 10.3  8.9 - 10.3 mg/dL Final   Total Protein 30/86/5784 10.0 (H)  6.5 - 8.1 g/dL Final   Albumin 69/62/9528 5.3 (H)  3.5 - 5.0 g/dL Final   AST 41/32/4401 27  15 - 41 U/L Final   ALT 03/30/2022 19  0 - 44 U/L Final   Alkaline Phosphatase 03/30/2022 46  38 - 126 U/L Final   Total Bilirubin 03/30/2022 0.7  0.3 - 1.2 mg/dL Final   GFR, Estimated 03/30/2022 43 (L)  >60 mL/min Final   Comment: (NOTE) Calculated using the CKD-EPI Creatinine Equation (2021)    Anion gap 03/30/2022 18 (H)  5 - 15 Final   Performed at [facility], [address], [place], [place] [address]   Lipase 03/30/2022 44  11 - 51 U/L Final   Performed at [facility], [address] [place], [place] [address]   WBC 03/30/2022 11.3 (H)  4.0 - 10.5 K/uL Final   RBC 03/30/2022 5.98 (H)  3.87 - 5.11 MIL/uL Final   Hemoglobin 03/30/2022 17.8 (H)  12.0 - 15.0 g/dL Final   HCT 03/47/4259 50.9 (H)  36.0 - 46.0 % Final   MCV 03/30/2022 85.1  80.0 - 100.0 fL Final   MCH 03/30/2022 29.8  26.0 - 34.0 pg Final   MCHC 03/30/2022 35.0  30.0 - 36.0 g/dL Final   RDW 56/38/7564 16.3 (H)  11.5 - 15.5 % Final   Platelets 03/30/2022 414 (H)  150 - 400 K/uL Final   nRBC 03/30/2022 0.0  0.0 - 0.2 % Final   Neutrophils Relative % 03/30/2022 82  % Final   Neutro Abs 03/30/2022 9.2 (H)  1.7 - 7.7 K/uL Final   Lymphocytes Relative 03/30/2022 13  % Final   Lymphs Abs 03/30/2022 1.5  0.7 - 4.0 K/uL Final   Monocytes Relative 03/30/2022 5  % Final   Monocytes Absolute 03/30/2022 0.6  0.1 - 1.0 K/uL Final   Eosinophils  Relative 03/30/2022 0  % Final   Eosinophils Absolute 03/30/2022 0.0  0.0 - 0.5 K/uL Final   Basophils Relative 03/30/2022 0  % Final   Basophils Absolute 03/30/2022 0.0  0.0 - 0.1 K/uL Final   Immature Granulocytes 03/30/2022 0  % Final   Abs Immature Granulocytes 03/30/2022 0.05  0.00 - 0.07 K/uL Final  Performed at [facility], [address] [place], [place] [address]   Alcohol, Ethyl (B) 03/30/2022 <10  <10 mg/dL Final   Comment: (NOTE) Lowest detectable limit for serum alcohol is 10 mg/dL.  For medical purposes only. Performed at [facility], [address], [place], [place] [address]    I-stat hCG, quantitative 03/30/2022 <5.0  <5 mIU/mL Final   Comment 3 03/30/2022          Final   Comment:   GEST. AGE      CONC.  (mIU/mL)   <=1 WEEK        5 - 50     2 WEEKS       50 - 500     3 WEEKS       100 - 10,000     4 WEEKS     1,000 - 30,000        FEMALE AND NON-PREGNANT FEMALE:     LESS THAN 5 mIU/mL    Sodium 03/30/2022 143  135 - 145 mmol/L Final   Potassium 03/30/2022 3.1 (L)  3.5 - 5.1 mmol/L Final   Chloride 03/30/2022 99  98 - 111 mmol/L Final   BUN 03/30/2022 37 (H)  6 - 20 mg/dL Final   Creatinine, Ser 03/30/2022 1.30 (H)  0.44 - 1.00 mg/dL Final   Glucose, Bld 56/21/3086 117 (H)  70 - 99 mg/dL Final   Glucose reference range applies only to samples taken after fasting for at least 8 hours.   Calcium, Ion 03/30/2022 1.08 (L)  1.15 - 1.40 mmol/L Final   TCO2 03/30/2022 32  22 - 32 mmol/L Final   Hemoglobin 03/30/2022 17.7 (H)  12.0 - 15.0 g/dL Final   HCT 57/84/6962 52.0 (H)  36.0 - 46.0 % Final   SARS Coronavirus 2 by RT PCR 03/30/2022 NEGATIVE  NEGATIVE Final   Comment: (NOTE) SARS-CoV-2 target nucleic acids are NOT DETECTED.  The SARS-CoV-2 RNA is generally detectable in upper respiratory specimens during the acute phase of infection. The lowest concentration of SARS-CoV-2 viral copies this assay can detect is 138 copies/mL. A negative result  does not preclude SARS-Cov-2 infection and should not be used as the sole basis for treatment or other patient management decisions. A negative result may occur with  improper specimen collection/handling, submission of specimen other than nasopharyngeal swab, presence of viral mutation(s) within the areas targeted by this assay, and inadequate number of viral copies(<138 copies/mL). A negative result must be combined with clinical observations, patient history, and epidemiological information. The expected result is Negative.  Fact Sheet for Patients:  BloggerCourse.com  Fact Sheet for Healthcare Providers:  SeriousBroker.it  This test is no                          t yet approved or cleared by the [place] FDA and  has been authorized for detection and/or diagnosis of SARS-CoV-2 by FDA under an Emergency Use Authorization (EUA). This EUA will remain  in effect (meaning this test can be used) for the duration of the COVID-19 declaration under Section 564(b)(1) of the Act, 21 U.S.C.section 360bbb-3(b)(1), unless the authorization is terminated  or revoked sooner.       Influenza A by PCR 03/30/2022 NEGATIVE  NEGATIVE Final   Influenza B by PCR 03/30/2022 NEGATIVE  NEGATIVE Final   Comment: (NOTE) The Xpert Xpress SARS-CoV-2/FLU/RSV plus assay is intended as an aid in the  diagnosis of influenza from Nasopharyngeal swab specimens and should not be used as a sole basis for treatment. Nasal washings and aspirates are unacceptable for Xpert Xpress SARS-CoV-2/FLU/RSV testing.  Fact Sheet for Patients: BloggerCourse.com  Fact Sheet for Healthcare Providers: SeriousBroker.it  This test is not yet approved or cleared by the [place] FDA and has been authorized for detection and/or diagnosis of SARS-CoV-2 by FDA under an Emergency Use Authorization (EUA). This EUA will  remain in effect (meaning this test can be used) for the duration of the COVID-19 declaration under Section 564(b)(1) of the Act, 21 U.S.C. section 360bbb-3(b)(1), unless the authorization is terminated or revoked.  Performed at [facility] Lab, [address], [place], [place] [address]     Blood Alcohol level:  Lab Results  Component Value Date   ETH <10 04/02/2022   ETH <10 03/30/2022    Metabolic Disorder Labs: Lab Results  Component Value Date   HGBA1C 5.4 04/02/2022   MPG 108.28 04/02/2022   No results found for: "PROLACTIN" Lab Results  Component Value Date   CHOL 142 04/02/2022   TRIG 63 04/02/2022   HDL 48 04/02/2022   CHOLHDL 3.0 04/02/2022   VLDL 13 04/02/2022   LDLCALC 81 04/02/2022    Therapeutic Lab Levels: No results found for: "LITHIUM" No results found for: "VALPROATE" No results found for: "CBMZ"  Physical Findings   PHQ2-9    Flowsheet Row ED from 04/01/2022 in [facility]  PHQ-2 Total Score 6  PHQ-9 Total Score 24      Flowsheet Row ED from 04/01/2022 in [facility] ED from 03/30/2022 in [facility] EMERGENCY DEPARTMENT ED from 03/16/2021 in [facility] at [facility] RISK CATEGORY No Risk No Risk No Risk        Musculoskeletal  Strength & Muscle Tone: within normal limits Gait & Station:  laying in bed during interview Patient leans: N/A  Psychiatric Specialty Exam  Presentation  General Appearance: Appropriate for Environment; Casual; Fairly Groomed  Eye Contact:Minimal  Speech:Clear and Coherent; Normal Rate  Speech Volume:Normal  Handedness:Right   Mood and Affect  Mood:Dysphoric; Anxious  Affect:Congruent; Appropriate; Full Range   Thought Process  Thought Processes:Goal Directed; Coherent; Linear  Descriptions of Associations:Intact  Orientation:Full (Time, Place and Person)  Thought Content:Rumination (Nicotine  craving, wants to be able to sleep at night.  Denied nightmares, but was unable to sleep last night due to opiate withdrawal symptoms.)     Hallucinations:Hallucinations: None  Ideas of Reference:None  Suicidal Thoughts:Suicidal Thoughts: No  Homicidal Thoughts:Homicidal Thoughts: No   Sensorium  Memory:Immediate Good  Judgment:Fair  Insight:Shallow   Executive Functions  Concentration:Fair  Attention Span:Fair  Recall:Good  Fund of Knowledge:Good  Language:Good   Psychomotor Activity  Psychomotor Activity:Psychomotor Activity: Normal   Assets  Assets:Communication Skills; Desire for Improvement; Resilience; Financial Resources/Insurance   Sleep  Sleep:Sleep: Poor (Interrupted by opiate withdrawal symptoms, no nightmares)   Nutritional Assessment (For OBS and [facility] admissions only) Has the patient had a weight loss or gain of 10 pounds or more in the last 3 months?: No Has the patient had a decrease in food intake/or appetite?: No Does the patient have dental problems?: No Does the patient have eating habits or behaviors that may be indicators of an eating disorder including binging or inducing vomiting?: No Has the patient recently lost weight without trying?: 0 Has the patient been eating poorly because of a decreased  appetite?: 0 Malnutrition Screening Tool Score: 0    Physical Exam  Physical Exam Vitals and nursing note reviewed.  Constitutional:      General: She is not in acute distress.    Appearance: Normal appearance. She is not ill-appearing or toxic-appearing.  HENT:     Head: Normocephalic and atraumatic.  Pulmonary:     Effort: Pulmonary effort is normal.  Neurological:     General: No focal deficit present.     Mental Status: She is alert.    Review of Systems  Constitutional:  Positive for chills and malaise/fatigue.  Respiratory:  Negative for shortness of breath.   Cardiovascular:  Negative for chest pain.  Gastrointestinal:   Positive for abdominal pain and constipation. Negative for nausea and vomiting.  Musculoskeletal:  Positive for myalgias.  Neurological:  Positive for tremors. Negative for dizziness and headaches.   Blood pressure 131/84, pulse 80, temperature 99 F (37.2 C), temperature source Oral, resp. rate 18, last menstrual period 03/07/2022, SpO2 100 %. There is no height or weight on file to calculate BMI.  Treatment Plan Summary: Daily contact with patient to assess and evaluate symptoms and progress in treatment and Medication management   Jessica Walton is a 36 yr old female who presented to [facility] on 04/01/22 requesting detox from fentanyl and treatment for worsening depressive symptoms, she was admitted to [facility] on 9/9.  PPHx is significant for Polysubstance Use Disorder, Bipolar Disorder, Depression, and Anxiety.  Bipolar Disorder  MDD  GAD  PTSD: Reported having MDD/GAD prior to substance use.  States she initially started using in 18-Dec-2013 after the death of her child and her husband later that same year.  Stated she used opiates to help her fall asleep and avoid PTSD nightmares and to help with withdrawal.  Stated that Seroquel was still ineffective and requested Ambien stated that trazodone did not work for her.  Concern for med seeking behaviors.  Discussed prazosin and Remeron, patient was amenable.  BP 115/74, will monitor for symptoms of orthostasis and hypotension.  Patient continues to endorse inefficiency of Seroquel, stated that it makes her feel restless, requested that it be discontinued. Will exchange seroquel to another second generation antipsychotic.  -Discontinued Seroquel to 150 mg QHS tonight for mood stability and sleep -Continued Remeron 7.5 mg nightly as sleep adjunct -Continued prazosin 1 mg nightly for PTSD nightmares -Repeat EKG, since last EKG was done prior to starting and up titrating antipsychotic - patient declined -Continued zyprexa 5 m qHS  Opiate Use Disorder: Denied  IVDU, and declined high risk screening of HIV, RPR, and hepatitis at this time.  Continued to have opiate withdrawal symptoms, given patient's blood pressure of 120s/80s, will increase clonidine taper to assist with withdrawal symptoms. COWS: 1  -Continue COWS -Continue Imodium 2-4 mg PRN diarrhea -Continue Robaxin 500 mg q8 PRN muscle spasms -Continue Naproxen 500 mg BID PRN pain -Continue Zofran-ODT 4 mg q6 PRN nausea -Continue Bentyl 20 mg q6 PRN spasms -Continue Thiamine 100 mg daily for nutritional supplementation -Continue Multivitamin daily for nutritional supplementation -Plan to discuss naltrexone prior to dc -Increased clonidine taper 0.1 mg to 0.2 mg (ends on 9/15)  Restless legs (resolved) Suspect secondary to opiate use disorder, ordered iron studies, plan to supplement if needed. Iron studies wnl.   Elevated Creatinine : -Is down trending over last 4 days- 1.60 -> 1.30 -> 1.22 ->1.13 -Encourage fluid intake -No further labs required -Recommended outpatient PCP follow-up  -Continue PRN's: Maalox, Atarax, Milk  of Magnesia  Princess Bruins, DO 04/06/2022 11:33 AM

## 2022-04-11 ENCOUNTER — Telehealth (HOSPITAL_COMMUNITY): Payer: Self-pay | Admitting: Student

## 2022-09-23 ENCOUNTER — Ambulatory Visit (INDEPENDENT_AMBULATORY_CARE_PROVIDER_SITE_OTHER): Payer: Medicaid Other | Admitting: Physician Assistant

## 2022-09-23 VITALS — BP 131/80 | HR 82 | Temp 98.3°F | Ht 69.0 in | Wt 166.8 lb

## 2022-09-23 DIAGNOSIS — F411 Generalized anxiety disorder: Secondary | ICD-10-CM | POA: Diagnosis not present

## 2022-09-23 DIAGNOSIS — F3132 Bipolar disorder, current episode depressed, moderate: Secondary | ICD-10-CM | POA: Diagnosis not present

## 2022-09-23 DIAGNOSIS — G4700 Insomnia, unspecified: Secondary | ICD-10-CM

## 2022-09-23 MED ORDER — OLANZAPINE 5 MG PO TABS: 5.0000 mg | ORAL_TABLET | Freq: Every day | ORAL | 1 refills | Status: DC

## 2022-09-23 MED ORDER — HYDROXYZINE HCL 10 MG PO TABS: 10.0000 mg | ORAL_TABLET | Freq: Three times a day (TID) | ORAL | 1 refills | Status: DC | PRN

## 2022-09-23 MED ORDER — MIRTAZAPINE 7.5 MG PO TABS: 7.5000 mg | ORAL_TABLET | Freq: Every day | ORAL | 1 refills | Status: DC
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
## 2022-09-26 ENCOUNTER — Encounter (HOSPITAL_COMMUNITY): Payer: Self-pay | Admitting: Physician Assistant

## 2022-09-27 ENCOUNTER — Ambulatory Visit (HOSPITAL_COMMUNITY)
Admission: EM | Admit: 2022-09-27 | Discharge: 2022-09-27 | Disposition: A | Discharge status: Home / Self Care | Payer: Medicaid Other | Attending: Emergency Medicine | Admitting: Emergency Medicine

## 2022-09-27 ENCOUNTER — Encounter (HOSPITAL_COMMUNITY): Payer: Self-pay | Admitting: *Deleted

## 2022-09-27 DIAGNOSIS — Z3202 Encounter for pregnancy test, result negative: Secondary | ICD-10-CM | POA: Diagnosis not present

## 2022-09-27 DIAGNOSIS — Z113 Encounter for screening for infections with a predominantly sexual mode of transmission: Secondary | ICD-10-CM

## 2022-09-27 LAB — POCT URINALYSIS DIPSTICK, ED / UC
Bilirubin Urine: NEGATIVE
Glucose, UA: NEGATIVE mg/dL
Leukocytes,Ua: NEGATIVE
Nitrite: NEGATIVE
Protein, ur: 30 mg/dL — AB
Specific Gravity, Urine: 1.025 (ref 1.005–1.030)
Urobilinogen, UA: 1 mg/dL (ref 0.0–1.0)
pH: 6.5 (ref 5.0–8.0)

## 2022-09-27 LAB — HIV ANTIBODY (ROUTINE TESTING W REFLEX): HIV Screen 4th Generation wRfx: NONREACTIVE

## 2022-09-27 LAB — POC URINE PREG, ED: Preg Test, Ur: NEGATIVE

## 2022-09-27 LAB — RPR: RPR Ser Ql: NONREACTIVE

## 2022-09-27 NOTE — ED Triage Notes (Signed)
Pt states she has a new sexual partner and would like all STI testing, Denies any sx or exposures.

## 2022-09-27 NOTE — ED Provider Notes (Signed)
[place]    CSN: [id number] Arrival date & time: 09/27/22  0803    HISTORY   Chief Complaint  Patient presents with   SEXUALLY TRANSMITTED DISEASE   HPI Jessica Walton is a pleasant, 37 y.o. female who presents to urgent care today. Patient states she has a new sexual partner and would like all STI testing.  Patient denies burning with urination, increased frequency of urination, suprapubic pain, perineal pain, flank pain, fever, chills, malaise, rigors, significant fatigue, abnormal vaginal discharge, vaginal itching, vaginal irritation, dyspareunia, genital lesion(s), and known exposure to STD.     The history is provided by the patient.   Past Medical History:  Diagnosis Date   Asthma    No inhaler use x 1 yr (2013)   Bipolar 1 disorder ([place])    Cocaine abuse ([place]) 2018   Headache    History of suicide attempt 2015   Hypertension    Lordosis    Opiate abuse ([place]) 2018   Opioid use disorder, severe, dependence ([place]) 07/25/2016   PTSD (post-traumatic stress disorder)    Seizures (HCC)    Tetrahydrocannabinol (THC) dependence ([place])    Patient Active Problem List   Diagnosis Date Noted   Bipolar affective disorder, currently depressed, moderate ([place]) 09/23/2022   Insomnia 09/23/2022   Generalized anxiety disorder 09/23/2022   BV (bacterial vaginosis) 04/06/2022   Substance use disorder 04/01/2022   Incompetency, cervical 10/07/2017   Inevitable abortion 10/06/2017   Cervical incompetence, antepartum 10/06/2017   Tetrahydrocannabinol (THC) dependence (HCC)    Seizures (HCC)    PTSD (post-traumatic stress disorder)    Bipolar 1 disorder ([place])    Opioid use disorder, severe, dependence ([place]) 07/25/2016   Cocaine abuse ([place]) 07/25/2016   Seizure, convulsion ([place]) 04/30/2015   History of suicide attempt 07/25/2013   Supervision of high-risk pregnancy 07/03/2013   History of prior pregnancy with short cervix, currently pregnant in second trimester  07/03/2013   Past Surgical History:  Procedure Laterality Date   EYE SURGERY     WISDOM TOOTH EXTRACTION     x4   OB History     Gravida  5   Para  3   Term  1   Preterm  1   AB  2   Living  1      SAB  1   IAB  1   Ectopic  0   Multiple  0   Live Births  1          Home Medications    Prior to Admission medications   Medication Sig Start Date End Date Taking? Authorizing Provider  hydrOXYzine (ATARAX) 10 MG tablet Take 1 tablet (10 mg total) by mouth 3 (three) times daily as needed. 09/23/22 09/23/23  Nwoko, Terese Door, PA  mirtazapine (REMERON) 7.5 MG tablet Take 1 tablet (7.5 mg total) by mouth at bedtime. 09/23/22 09/23/23  Nwoko, Terese Door, PA  OLANZapine (ZYPREXA) 5 MG tablet Take 1 tablet (5 mg total) by mouth at bedtime. 09/23/22 09/23/23  Nwoko, Terese Door, PA  prazosin (MINIPRESS) 1 MG capsule Take 1 capsule (1 mg total) by mouth at bedtime. 04/06/22   Merrily Brittle, DO    Family History Family History  Problem Relation Age of Onset   Hypertension Maternal Grandmother    Diabetes Maternal Grandmother    Seizures Neg Hx    Social History Social History   Tobacco Use   Smoking status: Some Days    Packs/day:  0.50    Years: 10.00    Total pack years: 5.00    Types: Cigarettes   Smokeless tobacco: Never  Vaping Use   Vaping Use: Never used  Substance Use Topics   Alcohol use: Yes    Alcohol/week: 0.0 standard drinks of alcohol    Comment: occasional   Drug use: No   Allergies   Benadryl [diphenhydramine hcl] and Vicodin [hydrocodone-acetaminophen]  Review of Systems Review of Systems Pertinent findings revealed after performing a 14 point review of systems has been noted in the history of present illness.  Physical Exam Vital Signs BP 135/79 (BP Location: Right Arm)   Pulse 75   Temp 98.2 F (36.8 C) (Oral)   Resp 18   LMP 09/27/2022 (Exact Date)   SpO2 97%   No data found.  Physical Exam Vitals and nursing note reviewed.   Constitutional:      General: She is not in acute distress.    Appearance: Normal appearance. She is not ill-appearing.  HENT:     Head: Normocephalic and atraumatic.  Eyes:     General: Lids are normal.        Right eye: No discharge.        Left eye: No discharge.     Extraocular Movements: Extraocular movements intact.     Conjunctiva/sclera: Conjunctivae normal.     Right eye: Right conjunctiva is not injected.     Left eye: Left conjunctiva is not injected.  Neck:     Trachea: Trachea and phonation normal.  Cardiovascular:     Rate and Rhythm: Normal rate and regular rhythm.     Pulses: Normal pulses.     Heart sounds: Normal heart sounds. No murmur heard.    No friction rub. No gallop.  Pulmonary:     Effort: Pulmonary effort is normal. No accessory muscle usage, prolonged expiration or respiratory distress.     Breath sounds: Normal breath sounds. No stridor, decreased air movement or transmitted upper airway sounds. No decreased breath sounds, wheezing, rhonchi or rales.  Chest:     Chest wall: No tenderness.  Genitourinary:    Comments: Patient politely declines pelvic exam today, patient provided a vaginal swab for testing. Musculoskeletal:        General: Normal range of motion.     Cervical back: Normal range of motion and neck supple. Normal range of motion.  Lymphadenopathy:     Cervical: No cervical adenopathy.  Skin:    General: Skin is warm and dry.     Findings: No erythema or rash.  Neurological:     General: No focal deficit present.     Mental Status: She is alert and oriented to person, place, and time.  Psychiatric:        Mood and Affect: Mood normal.        Behavior: Behavior normal.     Visual Acuity Right Eye Distance:   Left Eye Distance:   Bilateral Distance:    Right Eye Near:   Left Eye Near:    Bilateral Near:     UC Couse / Diagnostics / Procedures:     Radiology No results found.  Procedures Procedures (including critical  care time) EKG  Pending results:  Labs Reviewed  POCT URINALYSIS DIPSTICK, ED / UC - Abnormal; Notable for the following components:      Result Value   Ketones, ur TRACE (*)    Hgb urine dipstick LARGE (*)    Protein, ur 30 (*)  All other components within normal limits  HIV ANTIBODY (ROUTINE TESTING W REFLEX)  RPR  POCT URINALYSIS DIP (MANUAL ENTRY)  POCT URINE PREGNANCY  CERVICOVAGINAL ANCILLARY ONLY    Medications Ordered in UC: Medications - No data to display  UC Diagnoses / Final Clinical Impressions(s)   I have reviewed the triage vital signs and the nursing notes.  Pertinent labs & imaging results that were available during my care of the patient were reviewed by me and considered in my medical decision making (see chart for details).    Final diagnoses:  Screening examination for STD (sexually transmitted disease)   STD screening was performed, patient advised that the results be posted to their MyChart and if any of the results are positive, they will be notified by phone, further treatment will be provided as indicated based on results of STD screening. Patient was advised to abstain from sexual intercourse until that they receive the results of their STD testing.  Patient was also advised to use condoms to protect themselves from STD exposure. Urinalysis today was normal. Urine pregnancy test was negative. Return precautions advised.  Drug allergies reviewed, all questions addressed.     Please see discharge instructions below for details of plan of care as provided to patient. ED Prescriptions   None    PDMP not reviewed this encounter.  Disposition Upon Discharge:  Condition: stable for discharge home  Patient presented with concern for an acute illness with associated systemic symptoms and significant discomfort requiring urgent management. In my opinion, this is a condition that a prudent lay person (someone who possesses an average knowledge of  health and medicine) may potentially expect to result in complications if not addressed urgently such as respiratory distress, impairment of bodily function or dysfunction of bodily organs.   As such, the patient has been evaluated and assessed, work-up was performed and treatment was provided in alignment with urgent care protocols and evidence based medicine.  Patient/parent/caregiver has been advised that the patient may require follow up for further testing and/or treatment if the symptoms continue in spite of treatment, as clinically indicated and appropriate.  Routine symptom specific, illness specific and/or disease specific instructions were discussed with the patient and/or caregiver at length.  Prevention strategies for avoiding STD exposure were also discussed.  The patient will follow up with their current PCP if and as advised. If the patient does not currently have a PCP we will assist them in obtaining one.   The patient may need specialty follow up if the symptoms continue, in spite of conservative treatment and management, for further workup, evaluation, consultation and treatment as clinically indicated and appropriate.  Patient/parent/caregiver verbalized understanding and agreement of plan as discussed.  All questions were addressed during visit.  Please see discharge instructions below for further details of plan.  Discharge Instructions:   Discharge Instructions      The results of your vaginal swab test which screens for BV, yeast, gonorrhea, chlamydia and trichomonas and your blood syphilis and HIV tests will be posted to your MyChart account once they are complete.  This typically takes 2 to 4 days.  Please abstain from sexual intercourse of any kind, vaginal, oral or anal, until you have received the results of your STD testing.     If any of your results are abnormal, you will receive a phone call regarding treatment.  Prescriptions, if any are needed, will be provided  for you at your pharmacy.     Your  urine pregnancy test today is negative.   Our point-of-care analysis of your urine sample today was normal and did not reveal any concern for urinary tract infection.   If you have not had complete resolution of your symptoms after completing any recommended treatment or if your symptoms worsen, please return for repeat evaluation.   Thank you for visiting urgent care today.  I appreciate the opportunity to participate in your care.       This office note has been dictated using Museum/gallery curator.  Unfortunately, this method of dictation can sometimes lead to typographical or grammatical errors.  I apologize for your inconvenience in advance if this occurs.  Please do not hesitate to reach out to me if clarification is needed.       Lynden Oxford Scales, [place] 09/27/22 [phone]

## 2022-09-27 NOTE — Discharge Instructions (Addendum)
The results of your vaginal swab test which screens for BV, yeast, gonorrhea, chlamydia and trichomonas and your blood syphilis and HIV tests will be posted to your MyChart account once they are complete.  This typically takes 2 to 4 days.  Please abstain from sexual intercourse of any kind, vaginal, oral or anal, until you have received the results of your STD testing.     If any of your results are abnormal, you will receive a phone call regarding treatment.  Prescriptions, if any are needed, will be provided for you at your pharmacy.     Your urine pregnancy test today is negative.   Our point-of-care analysis of your urine sample today was normal and did not reveal any concern for urinary tract infection.   If you have not had complete resolution of your symptoms after completing any recommended treatment or if your symptoms worsen, please return for repeat evaluation.   Thank you for visiting urgent care today.  I appreciate the opportunity to participate in your care.

## 2022-09-28 LAB — CERVICOVAGINAL ANCILLARY ONLY
Bacterial Vaginitis (gardnerella): POSITIVE — AB
Candida Glabrata: NEGATIVE
Candida Vaginitis: NEGATIVE
Chlamydia: NEGATIVE
Comment: NEGATIVE
Comment: NEGATIVE
Comment: NEGATIVE
Comment: NEGATIVE
Comment: NEGATIVE
Comment: NORMAL
Neisseria Gonorrhea: NEGATIVE
Trichomonas: NEGATIVE

## 2022-09-29 ENCOUNTER — Telehealth: Payer: Self-pay | Admitting: Emergency Medicine

## 2022-09-29 MED ORDER — METRONIDAZOLE 500 MG PO TABS: 500.0000 mg | ORAL_TABLET | Freq: Two times a day (BID) | ORAL | 0 refills | Status: DC

## 2022-10-18 ENCOUNTER — Ambulatory Visit (HOSPITAL_COMMUNITY)
Admission: EM | Admit: 2022-10-18 | Discharge: 2022-10-18 | Disposition: A | Discharge status: Home / Self Care | Payer: Medicaid Other | Attending: Physician Assistant | Admitting: Physician Assistant

## 2022-10-18 ENCOUNTER — Encounter (HOSPITAL_COMMUNITY): Payer: Self-pay | Admitting: Emergency Medicine

## 2022-10-18 DIAGNOSIS — R112 Nausea with vomiting, unspecified: Secondary | ICD-10-CM | POA: Diagnosis not present

## 2022-10-18 DIAGNOSIS — R197 Diarrhea, unspecified: Secondary | ICD-10-CM

## 2022-10-18 LAB — CBG MONITORING, ED: Glucose-Capillary: 140 mg/dL — ABNORMAL HIGH (ref 70–99)

## 2022-10-18 MED ORDER — ONDANSETRON 4 MG PO TBDP
4.0000 mg | ORAL_TABLET | Freq: Once | ORAL | Status: AC
  Administered 2022-10-18: 4 mg via ORAL

## 2022-10-18 MED ORDER — ONDANSETRON HCL 4 MG PO TABS: 4.0000 mg | ORAL_TABLET | Freq: Four times a day (QID) | ORAL | 0 refills | Status: DC

## 2022-10-18 MED ORDER — ONDANSETRON 4 MG PO TBDP
ORAL_TABLET | ORAL | Status: AC
  Filled 2022-10-18: qty 1

## 2022-10-18 NOTE — ED Provider Notes (Signed)
[place]    CSN: [id number] Arrival date & time: 10/18/22  1824      History   Chief Complaint Chief Complaint  Patient presents with   Emesis   Diarrhea    HPI Jessica Walton is a 37 y.o. female.   Patient complains of nausea, vomiting, diarrhea that started last night after eating [place] food.  She reports that she has been taking Pepto-Bismol with some relief.  She reports drinking fluids today.  Denies fever, chills has some diffuse abdominal discomfort.    Past Medical History:  Diagnosis Date   Asthma    No inhaler use x 1 yr (2013)   Bipolar 1 disorder ([place])    Cocaine abuse ([place]) 2018   Headache    History of suicide attempt 2015   Hypertension    Lordosis    Opiate abuse ([place]) 2018   Opioid use disorder, severe, dependence ([place]) 07/25/2016   PTSD (post-traumatic stress disorder)    Seizures (HCC)    Tetrahydrocannabinol (THC) dependence ([place])     Patient Active Problem List   Diagnosis Date Noted   Bipolar affective disorder, currently depressed, moderate ([place]) 09/23/2022   Insomnia 09/23/2022   Generalized anxiety disorder 09/23/2022   BV (bacterial vaginosis) 04/06/2022   Substance use disorder 04/01/2022   Incompetency, cervical 10/07/2017   Inevitable abortion 10/06/2017   Cervical incompetence, antepartum 10/06/2017   Tetrahydrocannabinol (THC) dependence (HCC)    Seizures (HCC)    PTSD (post-traumatic stress disorder)    Bipolar 1 disorder ([place])    Opioid use disorder, severe, dependence ([place]) 07/25/2016   Cocaine abuse ([place]) 07/25/2016   Seizure, convulsion ([place]) 04/30/2015   History of suicide attempt 07/25/2013   Supervision of high-risk pregnancy 07/03/2013   History of prior pregnancy with short cervix, currently pregnant in second trimester 07/03/2013    Past Surgical History:  Procedure Laterality Date   EYE SURGERY     WISDOM TOOTH EXTRACTION     x4    OB History     Gravida  5   Para  3   Term  1    Preterm  1   AB  2   Living  1      SAB  1   IAB  1   Ectopic  0   Multiple  0   Live Births  1            Home Medications    Prior to Admission medications   Medication Sig Start Date End Date Taking? Authorizing Provider  ondansetron (ZOFRAN) 4 MG tablet Take 1 tablet (4 mg total) by mouth every 6 (six) hours. 10/18/22  Yes Ward, Lenise Arena, PA-C  hydrOXYzine (ATARAX) 10 MG tablet Take 1 tablet (10 mg total) by mouth 3 (three) times daily as needed. 09/23/22 09/23/23  Nwoko, Terese Door, PA  metroNIDAZOLE (FLAGYL) 500 MG tablet Take 1 tablet (500 mg total) by mouth 2 (two) times daily. 09/29/22   Lamptey, Myrene Galas, MD  mirtazapine (REMERON) 7.5 MG tablet Take 1 tablet (7.5 mg total) by mouth at bedtime. 09/23/22 09/23/23  Nwoko, Terese Door, PA  OLANZapine (ZYPREXA) 5 MG tablet Take 1 tablet (5 mg total) by mouth at bedtime. 09/23/22 09/23/23  Nwoko, Terese Door, PA  prazosin (MINIPRESS) 1 MG capsule Take 1 capsule (1 mg total) by mouth at bedtime. 04/06/22   Merrily Brittle, DO    Family History Family History  Problem Relation Age of Onset  Hypertension Maternal Grandmother    Diabetes Maternal Grandmother    Seizures Neg Hx     Social History Social History   Tobacco Use   Smoking status: Some Days    Packs/day: 0.50    Years: 10.00    Additional pack years: 0.00    Total pack years: 5.00    Types: Cigarettes   Smokeless tobacco: Never  Vaping Use   Vaping Use: Never used  Substance Use Topics   Alcohol use: Yes    Alcohol/week: 0.0 standard drinks of alcohol    Comment: occasional   Drug use: No     Allergies   Benadryl [diphenhydramine hcl] and Vicodin [hydrocodone-acetaminophen]   Review of Systems Review of Systems  Constitutional:  Negative for chills and fever.  HENT:  Negative for ear pain and sore throat.   Eyes:  Negative for pain and visual disturbance.  Respiratory:  Negative for cough and shortness of breath.   Cardiovascular:  Negative for chest  pain and palpitations.  Gastrointestinal:  Positive for diarrhea, nausea and vomiting. Negative for abdominal pain.  Genitourinary:  Negative for dysuria and hematuria.  Musculoskeletal:  Negative for arthralgias and back pain.  Skin:  Negative for color change and rash.  Neurological:  Negative for seizures and syncope.  All other systems reviewed and are negative.    Physical Exam Triage Vital Signs ED Triage Vitals  Enc Vitals Group     BP 10/18/22 1840 127/76     Pulse Rate 10/18/22 1840 80     Resp 10/18/22 1840 16     Temp 10/18/22 1840 98.3 F (36.8 C)     Temp Source 10/18/22 1840 Oral     SpO2 10/18/22 1840 97 %     Weight --      Height --      Head Circumference --      Peak Flow --      Pain Score 10/18/22 1839 10     Pain Loc --      Pain Edu? --      Excl. in [place]? --    No data found.  Updated Vital Signs BP 127/76 (BP Location: Left Arm)   Pulse 80   Temp 98.3 F (36.8 C) (Oral)   Resp 16   LMP 09/27/2022 (Exact Date)   SpO2 97%   Visual Acuity Right Eye Distance:   Left Eye Distance:   Bilateral Distance:    Right Eye Near:   Left Eye Near:    Bilateral Near:     Physical Exam Vitals and nursing note reviewed.  Constitutional:      General: She is not in acute distress.    Appearance: She is well-developed.  HENT:     Head: Normocephalic and atraumatic.  Eyes:     Conjunctiva/sclera: Conjunctivae normal.  Cardiovascular:     Rate and Rhythm: Normal rate and regular rhythm.     Heart sounds: No murmur heard. Pulmonary:     Effort: Pulmonary effort is normal. No respiratory distress.     Breath sounds: Normal breath sounds.  Abdominal:     Palpations: Abdomen is soft.     Tenderness: There is no abdominal tenderness.  Musculoskeletal:        General: No swelling.     Cervical back: Neck supple.  Skin:    General: Skin is warm and dry.     Capillary Refill: Capillary refill takes less than 2 seconds.  Neurological:  Mental  Status: She is alert.  Psychiatric:        Mood and Affect: Mood normal.      UC Treatments / Results  Labs (all labs ordered are listed, but only abnormal results are displayed) Labs Reviewed  CBG MONITORING, ED    EKG   Radiology No results found.  Procedures Procedures (including critical care time)  Medications Ordered in UC Medications  ondansetron (ZOFRAN-ODT) disintegrating tablet 4 mg (has no administration in time range)    Initial Impression / Assessment and Plan / UC Course  I have reviewed the triage vital signs and the nursing notes.  Pertinent labs & imaging results that were available during my care of the patient were reviewed by me and considered in my medical decision making (see chart for details).     Nausea vomiting diarrhea.  Zofran given in clinic today with some relief.  Zofran prescription sent to pharmacy.  Advised Imodium as needed.  Patient overall well-appearing in no acute distress, items within normal limits.  Supportive care discussed.  Return precautions discussed. Patient requests blood sugar check today.  She does have a PCP.  Recommend follow-up for further evaluation. Final Clinical Impressions(s) / UC Diagnoses   Final diagnoses:  Nausea vomiting and diarrhea     Discharge Instructions      Take Zofran as needed Drink plenty of fluids, recommend gatorade or pedialyte.  Can take imodium as needed for diarrhea.    ED Prescriptions     Medication Sig Dispense Auth. Provider   ondansetron (ZOFRAN) 4 MG tablet Take 1 tablet (4 mg total) by mouth every 6 (six) hours. 12 tablet Ward, Lenise Arena, PA-C      PDMP not reviewed this encounter.   Ward, Lenise Arena, PA-C 10/18/22 [phone]

## 2022-10-18 NOTE — ED Triage Notes (Signed)
Pt reports having n/v/d and headaches that started last night after eating some food that believes is bad. Pt has bag of food with her and would like tested.  Took Pepto Bismol.

## 2022-10-18 NOTE — Discharge Instructions (Addendum)
Take Zofran as needed Drink plenty of fluids, recommend gatorade or pedialyte.  Can take imodium as needed for diarrhea.

## 2022-10-27 ENCOUNTER — Telehealth (HOSPITAL_COMMUNITY): Payer: Self-pay | Admitting: Physician Assistant

## 2022-11-02 ENCOUNTER — Other Ambulatory Visit (HOSPITAL_COMMUNITY): Payer: Self-pay | Admitting: Physician Assistant

## 2022-11-02 DIAGNOSIS — G4700 Insomnia, unspecified: Secondary | ICD-10-CM

## 2022-11-02 DIAGNOSIS — F3132 Bipolar disorder, current episode depressed, moderate: Secondary | ICD-10-CM

## 2022-11-02 DIAGNOSIS — F411 Generalized anxiety disorder: Secondary | ICD-10-CM

## 2022-11-02 MED ORDER — MIRTAZAPINE 15 MG PO TABS: 15.0000 mg | ORAL_TABLET | Freq: Every day | ORAL | 1 refills | Status: DC

## 2022-11-02 MED ORDER — BENZTROPINE MESYLATE 1 MG PO TABS: 0.5000 mg | ORAL_TABLET | Freq: Every day | ORAL | 2 refills | Status: DC

## 2022-11-02 MED ORDER — HYDROXYZINE HCL 10 MG PO TABS: 10.0000 mg | ORAL_TABLET | Freq: Three times a day (TID) | ORAL | 1 refills | Status: DC | PRN

## 2022-11-02 MED ORDER — OLANZAPINE 5 MG PO TABS: 5.0000 mg | ORAL_TABLET | Freq: Every day | ORAL | 1 refills | Status: DC

## 2022-11-02 NOTE — Telephone Encounter (Signed)
Message acknowledged and reviewed.  Provider was able to discuss patient's concerns.  In addition to her hand shaking, patient also endorses elevated anxiety and worsening irritability.  Provider adjusted patient's medications accordingly.  See note.

## 2022-11-02 NOTE — Progress Notes (Signed)
Patient was able to reach out to provider regarding concerns over her medications.  Patient states that she is still experiencing elevated anxiety and has noticed nervous shaking in her hands.  Patient denies experiencing any shaking/tremors in any other part of her body.  Patient also reports that she has been unable to sleep and states that she often jolts awake which is often accompanied by heart racing when trying to sleep.  Patient states that she has become more irritable and is often withdrawn.  Patient inquired about being placed on Ambien for the management of her sleep.  Patient states that that has been the only medication that has been effective in managing her sleep in the past.  She reports that trazodone has not been effective in managing her sleep stating that the medication would often feel like she is being "pulled into sleep."  Provider recommended increasing patient's mirtazapine from 7.5 mg to 15 mg at bedtime for the management of her sleep, anxiety, and depressive symptoms.  Provider also recommended benztropine 0.5 mg daily for the management of extrapyramidal symptoms from patient's use of antipsychotic.  Provider informed patient that she would not be able to receive Ambien today.  Provider to look into other options for managing patient's sleep.

## 2022-11-04 ENCOUNTER — Ambulatory Visit (INDEPENDENT_AMBULATORY_CARE_PROVIDER_SITE_OTHER): Payer: Medicaid Other | Admitting: Physician Assistant

## 2022-11-04 ENCOUNTER — Encounter (HOSPITAL_COMMUNITY): Payer: Self-pay | Admitting: Physician Assistant

## 2022-11-04 VITALS — BP 139/97 | HR 83 | Ht 69.0 in | Wt 178.8 lb

## 2022-11-04 DIAGNOSIS — F3132 Bipolar disorder, current episode depressed, moderate: Secondary | ICD-10-CM

## 2022-11-04 DIAGNOSIS — F411 Generalized anxiety disorder: Secondary | ICD-10-CM | POA: Diagnosis not present

## 2022-11-04 DIAGNOSIS — G4700 Insomnia, unspecified: Secondary | ICD-10-CM

## 2022-11-04 MED ORDER — OLANZAPINE 5 MG PO TABS: 5.0000 mg | ORAL_TABLET | Freq: Two times a day (BID) | ORAL | 1 refills | Status: DC
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
## 2022-12-06 ENCOUNTER — Ambulatory Visit (INDEPENDENT_AMBULATORY_CARE_PROVIDER_SITE_OTHER): Payer: Medicaid Other | Admitting: Physician Assistant

## 2022-12-06 VITALS — BP 137/93 | HR 73 | Temp 97.8°F | Ht 69.0 in | Wt 189.0 lb

## 2022-12-06 DIAGNOSIS — F3132 Bipolar disorder, current episode depressed, moderate: Secondary | ICD-10-CM

## 2022-12-06 DIAGNOSIS — F411 Generalized anxiety disorder: Secondary | ICD-10-CM | POA: Diagnosis not present

## 2022-12-06 DIAGNOSIS — F4310 Post-traumatic stress disorder, unspecified: Secondary | ICD-10-CM

## 2022-12-06 DIAGNOSIS — G4700 Insomnia, unspecified: Secondary | ICD-10-CM

## 2022-12-06 MED ORDER — OLANZAPINE 5 MG PO TABS: 5.0000 mg | ORAL_TABLET | Freq: Two times a day (BID) | ORAL | 1 refills | Status: DC

## 2022-12-06 MED ORDER — MIRTAZAPINE 30 MG PO TABS
30.0000 mg | ORAL_TABLET | Freq: Every day | ORAL | 1 refills | Status: DC
Start: 2022-12-06 — End: 2023-02-07

## 2022-12-06 MED ORDER — MIRTAZAPINE 15 MG PO TABS: 15.0000 mg | ORAL_TABLET | Freq: Every day | ORAL | 1 refills | Status: DC

## 2022-12-06 MED ORDER — PRAZOSIN HCL 1 MG PO CAPS
1.0000 mg | ORAL_CAPSULE | Freq: Every day | ORAL | 1 refills | Status: DC
Start: 2022-12-06 — End: 2023-02-07
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
## 2022-12-13 ENCOUNTER — Ambulatory Visit (HOSPITAL_COMMUNITY): Payer: Self-pay | Admitting: Mental Health

## 2023-01-17 ENCOUNTER — Encounter (HOSPITAL_COMMUNITY): Payer: Medicaid Other | Admitting: Physician Assistant

## 2023-02-07 ENCOUNTER — Telehealth (HOSPITAL_COMMUNITY): Payer: Self-pay | Admitting: Physician Assistant

## 2023-02-07 ENCOUNTER — Other Ambulatory Visit (HOSPITAL_COMMUNITY): Payer: Self-pay | Admitting: Physician Assistant

## 2023-02-07 DIAGNOSIS — F411 Generalized anxiety disorder: Secondary | ICD-10-CM

## 2023-02-07 DIAGNOSIS — G4700 Insomnia, unspecified: Secondary | ICD-10-CM

## 2023-02-07 DIAGNOSIS — F4310 Post-traumatic stress disorder, unspecified: Secondary | ICD-10-CM

## 2023-02-07 DIAGNOSIS — F3132 Bipolar disorder, current episode depressed, moderate: Secondary | ICD-10-CM

## 2023-02-07 MED ORDER — OLANZAPINE 5 MG PO TABS
5.0000 mg | ORAL_TABLET | Freq: Two times a day (BID) | ORAL | 0 refills | Status: DC
Start: 2023-02-07 — End: 2023-02-09

## 2023-02-07 MED ORDER — PRAZOSIN HCL 1 MG PO CAPS
1.0000 mg | ORAL_CAPSULE | Freq: Every day | ORAL | 0 refills | Status: DC
Start: 2023-02-07 — End: 2023-03-14

## 2023-02-07 MED ORDER — MIRTAZAPINE 30 MG PO TABS
30.0000 mg | ORAL_TABLET | Freq: Every day | ORAL | 0 refills | Status: DC
Start: 2023-02-07 — End: 2023-02-09

## 2023-02-07 NOTE — Progress Notes (Signed)
Patient presented to the facility requesting refills on her medication.  She was informed that she had missed her previous follow-up appointment.  Patient was given a new appointment for 02/09/2023 and was informed that she would be provided a bridge for her medications.  Provider to provide a 30-day bridge on her medications.  Patient's medications to be e-prescribed to pharmacy of choice.

## 2023-02-07 NOTE — Telephone Encounter (Signed)
Message acknowledged and reviewed.

## 2023-02-09 ENCOUNTER — Ambulatory Visit (INDEPENDENT_AMBULATORY_CARE_PROVIDER_SITE_OTHER): Payer: MEDICAID | Admitting: Physician Assistant

## 2023-02-09 VITALS — BP 135/89 | HR 65 | Temp 98.3°F | Ht 69.0 in | Wt 207.8 lb

## 2023-02-09 DIAGNOSIS — F3132 Bipolar disorder, current episode depressed, moderate: Secondary | ICD-10-CM | POA: Diagnosis not present

## 2023-02-09 DIAGNOSIS — G4700 Insomnia, unspecified: Secondary | ICD-10-CM

## 2023-02-09 DIAGNOSIS — F4310 Post-traumatic stress disorder, unspecified: Secondary | ICD-10-CM

## 2023-02-09 DIAGNOSIS — F411 Generalized anxiety disorder: Secondary | ICD-10-CM

## 2023-02-09 MED ORDER — OLANZAPINE 10 MG PO TABS
10.0000 mg | ORAL_TABLET | Freq: Every day | ORAL | 1 refills | Status: DC
Start: 2023-02-09 — End: 2023-02-15

## 2023-02-09 MED ORDER — MIRTAZAPINE 30 MG PO TABS
30.0000 mg | ORAL_TABLET | Freq: Every day | ORAL | 1 refills | Status: DC
Start: 2023-02-09 — End: 2023-02-15

## 2023-02-09 MED ORDER — GABAPENTIN 100 MG PO CAPS
100.0000 mg | ORAL_CAPSULE | Freq: Three times a day (TID) | ORAL | 1 refills | Status: DC
Start: 2023-02-09 — End: 2023-02-15
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
## 2023-02-10 ENCOUNTER — Encounter (HOSPITAL_COMMUNITY): Payer: Self-pay | Admitting: Physician Assistant

## 2023-02-15 ENCOUNTER — Telehealth (HOSPITAL_COMMUNITY): Payer: Self-pay | Admitting: *Deleted

## 2023-02-15 ENCOUNTER — Other Ambulatory Visit (HOSPITAL_COMMUNITY): Payer: Self-pay | Admitting: Physician Assistant

## 2023-02-15 DIAGNOSIS — F4310 Post-traumatic stress disorder, unspecified: Secondary | ICD-10-CM

## 2023-02-15 DIAGNOSIS — F3132 Bipolar disorder, current episode depressed, moderate: Secondary | ICD-10-CM

## 2023-02-15 DIAGNOSIS — F411 Generalized anxiety disorder: Secondary | ICD-10-CM

## 2023-02-15 MED ORDER — MIRTAZAPINE 30 MG PO TABS
30.0000 mg | ORAL_TABLET | Freq: Every day | ORAL | 1 refills | Status: DC
Start: 2023-02-15 — End: 2023-03-10

## 2023-02-15 MED ORDER — GABAPENTIN 100 MG PO CAPS: 100.0000 mg | ORAL_CAPSULE | Freq: Three times a day (TID) | ORAL | 1 refills | Status: DC

## 2023-02-15 MED ORDER — OLANZAPINE 10 MG PO TABS
10.0000 mg | ORAL_TABLET | Freq: Every day | ORAL | 1 refills | Status: DC
Start: 2023-02-15 — End: 2023-03-10
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
## 2023-02-15 NOTE — Telephone Encounter (Signed)
Message acknowledged and reviewed.  Patient is not currently on any controlled substances by this provider.  Provider to refill patient's medication and sent to pharmacy of choice.

## 2023-03-10 ENCOUNTER — Other Ambulatory Visit (HOSPITAL_COMMUNITY): Payer: Self-pay | Admitting: Physician Assistant

## 2023-03-10 DIAGNOSIS — F3132 Bipolar disorder, current episode depressed, moderate: Secondary | ICD-10-CM

## 2023-03-10 DIAGNOSIS — F411 Generalized anxiety disorder: Secondary | ICD-10-CM

## 2023-03-10 DIAGNOSIS — F4310 Post-traumatic stress disorder, unspecified: Secondary | ICD-10-CM

## 2023-03-14 ENCOUNTER — Other Ambulatory Visit (HOSPITAL_COMMUNITY): Payer: Self-pay | Admitting: Physician Assistant

## 2023-03-14 DIAGNOSIS — F4310 Post-traumatic stress disorder, unspecified: Secondary | ICD-10-CM

## 2023-03-21 ENCOUNTER — Encounter (HOSPITAL_COMMUNITY): Payer: Medicaid Other | Admitting: Physician Assistant

## 2023-04-12 ENCOUNTER — Telehealth (HOSPITAL_COMMUNITY): Payer: Self-pay | Admitting: Physician Assistant

## 2023-04-13 ENCOUNTER — Ambulatory Visit (INDEPENDENT_AMBULATORY_CARE_PROVIDER_SITE_OTHER): Payer: MEDICAID | Admitting: Physician Assistant

## 2023-04-13 DIAGNOSIS — F4310 Post-traumatic stress disorder, unspecified: Secondary | ICD-10-CM | POA: Diagnosis not present

## 2023-04-13 DIAGNOSIS — F3132 Bipolar disorder, current episode depressed, moderate: Secondary | ICD-10-CM

## 2023-04-13 DIAGNOSIS — F411 Generalized anxiety disorder: Secondary | ICD-10-CM

## 2023-04-13 MED ORDER — OLANZAPINE 5 MG PO TABS
ORAL_TABLET | ORAL | 1 refills | Status: DC
Start: 2023-04-13 — End: 2023-05-25

## 2023-04-13 MED ORDER — MIRTAZAPINE 30 MG PO TABS: 30.0000 mg | ORAL_TABLET | Freq: Every day | ORAL | 1 refills | Status: DC

## 2023-04-13 MED ORDER — MIRTAZAPINE 7.5 MG PO TABS
ORAL_TABLET | ORAL | 0 refills | Status: DC
Start: 2023-04-13 — End: 2023-05-23

## 2023-04-13 MED ORDER — GABAPENTIN 100 MG PO CAPS
100.0000 mg | ORAL_CAPSULE | Freq: Three times a day (TID) | ORAL | 1 refills | Status: DC
Start: 2023-04-13 — End: 2023-05-25

## 2023-04-14 ENCOUNTER — Encounter (HOSPITAL_COMMUNITY): Payer: Self-pay | Admitting: Physician Assistant
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
## 2023-04-18 NOTE — Telephone Encounter (Signed)
Patient's medications were refilled during her previous encounter.  Patient's next scheduled appointment is for 05/25/2023.

## 2023-05-19 ENCOUNTER — Other Ambulatory Visit (HOSPITAL_COMMUNITY): Payer: Self-pay | Admitting: Physician Assistant

## 2023-05-19 DIAGNOSIS — F4310 Post-traumatic stress disorder, unspecified: Secondary | ICD-10-CM

## 2023-05-19 DIAGNOSIS — F411 Generalized anxiety disorder: Secondary | ICD-10-CM

## 2023-05-19 DIAGNOSIS — F3132 Bipolar disorder, current episode depressed, moderate: Secondary | ICD-10-CM

## 2023-05-25 ENCOUNTER — Ambulatory Visit (HOSPITAL_COMMUNITY): Payer: MEDICAID | Admitting: Physician Assistant

## 2023-05-25 ENCOUNTER — Encounter (HOSPITAL_COMMUNITY): Payer: Self-pay | Admitting: Physician Assistant

## 2023-05-25 DIAGNOSIS — F4310 Post-traumatic stress disorder, unspecified: Secondary | ICD-10-CM

## 2023-05-25 DIAGNOSIS — F3132 Bipolar disorder, current episode depressed, moderate: Secondary | ICD-10-CM

## 2023-05-25 DIAGNOSIS — F411 Generalized anxiety disorder: Secondary | ICD-10-CM | POA: Diagnosis not present

## 2023-05-25 MED ORDER — MIRTAZAPINE 30 MG PO TABS
30.0000 mg | ORAL_TABLET | Freq: Every day | ORAL | 1 refills | Status: DC
Start: 2023-05-25 — End: 2023-07-06

## 2023-05-25 MED ORDER — OLANZAPINE 15 MG PO TABS: 15.0000 mg | ORAL_TABLET | Freq: Every day | ORAL | 1 refills | Status: DC

## 2023-05-25 MED ORDER — GABAPENTIN 100 MG PO CAPS
200.0000 mg | ORAL_CAPSULE | Freq: Three times a day (TID) | ORAL | 1 refills | Status: DC
Start: 2023-05-25 — End: 2023-07-06
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
## 2023-06-05 ENCOUNTER — Telehealth (HOSPITAL_COMMUNITY): Payer: Self-pay | Admitting: Physician Assistant

## 2023-06-06 ENCOUNTER — Telehealth (HOSPITAL_COMMUNITY): Payer: Self-pay | Admitting: Physician Assistant

## 2023-06-06 ENCOUNTER — Other Ambulatory Visit (HOSPITAL_COMMUNITY): Payer: Self-pay | Admitting: Physician Assistant

## 2023-06-06 DIAGNOSIS — F3132 Bipolar disorder, current episode depressed, moderate: Secondary | ICD-10-CM

## 2023-06-06 MED ORDER — OLANZAPINE 5 MG PO TABS
5.0000 mg | ORAL_TABLET | Freq: Every day | ORAL | 1 refills | Status: DC
Start: 2023-06-06 — End: 2023-07-10

## 2023-06-06 NOTE — Progress Notes (Signed)
Provider was contacted by front desk staff regarding patient's request for olanzapine adjustments.  Patient reports that her current olanzapine dosage is giving her nightmares and is requesting to be placed back on 5 mg daily.  Provider to adjust patient's olanzapine from 15 mg to 5 mg daily.  Patient's medication to be e-prescribed to pharmacy of choice.

## 2023-06-06 NOTE — Telephone Encounter (Signed)
Message acknowledged and reviewed.  Patient's medication was adjusted.  Provider to reach out to patient regarding issues with medication.

## 2023-06-06 NOTE — Telephone Encounter (Signed)
Message acknowledged and reviewed.  Patient's medication was adjusted from 15 mg to 5 mg daily.

## 2023-07-06 ENCOUNTER — Encounter (HOSPITAL_COMMUNITY): Payer: Self-pay | Admitting: Physician Assistant

## 2023-07-06 ENCOUNTER — Ambulatory Visit (HOSPITAL_COMMUNITY): Payer: MEDICAID | Admitting: Physician Assistant

## 2023-07-06 DIAGNOSIS — F4310 Post-traumatic stress disorder, unspecified: Secondary | ICD-10-CM

## 2023-07-06 DIAGNOSIS — F3132 Bipolar disorder, current episode depressed, moderate: Secondary | ICD-10-CM | POA: Diagnosis not present

## 2023-07-06 DIAGNOSIS — F411 Generalized anxiety disorder: Secondary | ICD-10-CM | POA: Diagnosis not present

## 2023-07-06 MED ORDER — GABAPENTIN 100 MG PO CAPS: 200.0000 mg | ORAL_CAPSULE | Freq: Three times a day (TID) | ORAL | 1 refills | Status: DC

## 2023-07-06 MED ORDER — MIRTAZAPINE 30 MG PO TABS: 30.0000 mg | ORAL_TABLET | Freq: Every day | ORAL | 1 refills | Status: DC

## 2023-07-06 MED ORDER — LYBALVI 10-10 MG PO TABS: 1.0000 | ORAL_TABLET | Freq: Two times a day (BID) | ORAL | 0 refills | Status: DC
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
## 2023-07-07 ENCOUNTER — Other Ambulatory Visit: Payer: Self-pay

## 2023-07-07 ENCOUNTER — Encounter (HOSPITAL_COMMUNITY): Payer: Self-pay

## 2023-07-07 ENCOUNTER — Emergency Department (HOSPITAL_COMMUNITY): Payer: MEDICAID

## 2023-07-07 ENCOUNTER — Inpatient Hospital Stay (HOSPITAL_COMMUNITY)
Admission: EM | Admit: 2023-07-07 | Discharge: 2023-07-10 | DRG: 917 | Discharge status: Against Medical Advice | Payer: MEDICAID | Attending: Family Medicine | Admitting: Family Medicine

## 2023-07-07 ENCOUNTER — Telehealth (HOSPITAL_COMMUNITY): Payer: Self-pay | Admitting: Physician Assistant

## 2023-07-07 DIAGNOSIS — Z885 Allergy status to narcotic agent status: Secondary | ICD-10-CM

## 2023-07-07 DIAGNOSIS — K2970 Gastritis, unspecified, without bleeding: Secondary | ICD-10-CM | POA: Diagnosis not present

## 2023-07-07 DIAGNOSIS — D72829 Elevated white blood cell count, unspecified: Secondary | ICD-10-CM | POA: Diagnosis present

## 2023-07-07 DIAGNOSIS — Z833 Family history of diabetes mellitus: Secondary | ICD-10-CM

## 2023-07-07 DIAGNOSIS — Z5329 Procedure and treatment not carried out because of patient's decision for other reasons: Secondary | ICD-10-CM | POA: Diagnosis present

## 2023-07-07 DIAGNOSIS — Z79899 Other long term (current) drug therapy: Secondary | ICD-10-CM

## 2023-07-07 DIAGNOSIS — Z87891 Personal history of nicotine dependence: Secondary | ICD-10-CM

## 2023-07-07 DIAGNOSIS — Z8249 Family history of ischemic heart disease and other diseases of the circulatory system: Secondary | ICD-10-CM

## 2023-07-07 DIAGNOSIS — T50904A Poisoning by unspecified drugs, medicaments and biological substances, undetermined, initial encounter: Secondary | ICD-10-CM | POA: Diagnosis not present

## 2023-07-07 DIAGNOSIS — Z8241 Family history of sudden cardiac death: Secondary | ICD-10-CM

## 2023-07-07 DIAGNOSIS — R9431 Abnormal electrocardiogram [ECG] [EKG]: Secondary | ICD-10-CM | POA: Insufficient documentation

## 2023-07-07 DIAGNOSIS — N179 Acute kidney failure, unspecified: Secondary | ICD-10-CM | POA: Diagnosis not present

## 2023-07-07 DIAGNOSIS — R12 Heartburn: Secondary | ICD-10-CM | POA: Insufficient documentation

## 2023-07-07 DIAGNOSIS — F310 Bipolar disorder, current episode hypomanic: Secondary | ICD-10-CM | POA: Diagnosis present

## 2023-07-07 DIAGNOSIS — F209 Schizophrenia, unspecified: Secondary | ICD-10-CM | POA: Diagnosis present

## 2023-07-07 DIAGNOSIS — R6511 Systemic inflammatory response syndrome (SIRS) of non-infectious origin with acute organ dysfunction: Secondary | ICD-10-CM | POA: Diagnosis present

## 2023-07-07 DIAGNOSIS — Z9151 Personal history of suicidal behavior: Secondary | ICD-10-CM

## 2023-07-07 DIAGNOSIS — R339 Retention of urine, unspecified: Secondary | ICD-10-CM | POA: Diagnosis not present

## 2023-07-07 DIAGNOSIS — T50901A Poisoning by unspecified drugs, medicaments and biological substances, accidental (unintentional), initial encounter: Secondary | ICD-10-CM

## 2023-07-07 DIAGNOSIS — R000 Tachycardia, unspecified: Secondary | ICD-10-CM

## 2023-07-07 DIAGNOSIS — I10 Essential (primary) hypertension: Secondary | ICD-10-CM | POA: Diagnosis present

## 2023-07-07 DIAGNOSIS — E872 Acidosis, unspecified: Secondary | ICD-10-CM | POA: Diagnosis present

## 2023-07-07 DIAGNOSIS — Z888 Allergy status to other drugs, medicaments and biological substances status: Secondary | ICD-10-CM

## 2023-07-07 DIAGNOSIS — F4310 Post-traumatic stress disorder, unspecified: Secondary | ICD-10-CM | POA: Diagnosis present

## 2023-07-07 DIAGNOSIS — T43501A Poisoning by unspecified antipsychotics and neuroleptics, accidental (unintentional), initial encounter: Principal | ICD-10-CM | POA: Diagnosis present

## 2023-07-07 DIAGNOSIS — F411 Generalized anxiety disorder: Secondary | ICD-10-CM | POA: Diagnosis present

## 2023-07-07 LAB — I-STAT VENOUS BLOOD GAS, ED
Acid-base deficit: 8 mmol/L — ABNORMAL HIGH (ref 0.0–2.0)
Acid-base deficit: 12 mmol/L — ABNORMAL HIGH (ref 0.0–2.0)
Bicarbonate: 17.3 mmol/L — ABNORMAL LOW (ref 20.0–28.0)
Bicarbonate: 13.5 mmol/L — ABNORMAL LOW (ref 20.0–28.0)
Calcium, Ion: 1.02 mmol/L — ABNORMAL LOW (ref 1.15–1.40)
Calcium, Ion: 0.94 mmol/L — ABNORMAL LOW (ref 1.15–1.40)
HCT: 60 % — ABNORMAL HIGH (ref 36.0–46.0)
HCT: 40 % (ref 36.0–46.0)
Hemoglobin: 20.4 g/dL — ABNORMAL HIGH (ref 12.0–15.0)
Hemoglobin: 13.6 g/dL (ref 12.0–15.0)
O2 Saturation: 99 %
O2 Saturation: 96 %
Potassium: >8.5 mmol/L (ref 3.5–5.1)
Potassium: 3.3 mmol/L — ABNORMAL LOW (ref 3.5–5.1)
Sodium: 133 mmol/L — ABNORMAL LOW (ref 135–145)
Sodium: 149 mmol/L — ABNORMAL HIGH (ref 135–145)
TCO2: 18 mmol/L — ABNORMAL LOW (ref 22–32)
TCO2: 14 mmol/L — ABNORMAL LOW (ref 22–32)
pCO2, Ven: 36.4 mm[Hg] — ABNORMAL LOW (ref 44–60)
pCO2, Ven: 29.6 mm[Hg] — ABNORMAL LOW (ref 44–60)
pH, Ven: 7.284 (ref 7.25–7.43)
pH, Ven: 7.266 (ref 7.25–7.43)
pO2, Ven: 154 mm[Hg] — ABNORMAL HIGH (ref 32–45)
pO2, Ven: 95 mm[Hg] — ABNORMAL HIGH (ref 32–45)

## 2023-07-07 LAB — CBC WITH DIFFERENTIAL/PLATELET
Abs Immature Granulocytes: 0 10*3/uL (ref 0.00–0.07)
Basophils Absolute: 0.2 10*3/uL — ABNORMAL HIGH (ref 0.0–0.1)
Basophils Relative: 1 %
Eosinophils Absolute: 0 10*3/uL (ref 0.0–0.5)
Eosinophils Relative: 0 %
HCT: 52.1 % — ABNORMAL HIGH (ref 36.0–46.0)
Hemoglobin: 17.2 g/dL — ABNORMAL HIGH (ref 12.0–15.0)
Lymphocytes Relative: 6 %
Lymphs Abs: 1.3 10*3/uL (ref 0.7–4.0)
MCH: 28 pg (ref 26.0–34.0)
MCHC: 33 g/dL (ref 30.0–36.0)
MCV: 84.9 fL (ref 80.0–100.0)
Monocytes Absolute: 1.3 10*3/uL — ABNORMAL HIGH (ref 0.1–1.0)
Monocytes Relative: 6 %
Neutro Abs: 19.5 10*3/uL — ABNORMAL HIGH (ref 1.7–7.7)
Neutrophils Relative %: 87 %
Platelets: 453 10*3/uL — ABNORMAL HIGH (ref 150–400)
RBC: 6.14 MIL/uL — ABNORMAL HIGH (ref 3.87–5.11)
RDW: 18 % — ABNORMAL HIGH (ref 11.5–15.5)
WBC: 22.4 10*3/uL — ABNORMAL HIGH (ref 4.0–10.5)
nRBC: 0 % (ref 0.0–0.2)
nRBC: 0 /100{WBCs}

## 2023-07-07 LAB — COMPREHENSIVE METABOLIC PANEL
ALT: 25 U/L (ref 0–44)
ALT: 25 U/L (ref 0–44)
AST: 48 U/L — ABNORMAL HIGH (ref 15–41)
AST: 39 U/L (ref 15–41)
Albumin: 3.5 g/dL (ref 3.5–5.0)
Albumin: 3.8 g/dL (ref 3.5–5.0)
Alkaline Phosphatase: 58 U/L (ref 38–126)
Alkaline Phosphatase: 71 U/L (ref 38–126)
Anion gap: 17 — ABNORMAL HIGH (ref 5–15)
Anion gap: 15 (ref 5–15)
BUN: 49 mg/dL — ABNORMAL HIGH (ref 6–20)
BUN: 44 mg/dL — ABNORMAL HIGH (ref 6–20)
CO2: 13 mmol/L — ABNORMAL LOW (ref 22–32)
CO2: 16 mmol/L — ABNORMAL LOW (ref 22–32)
Calcium: 8.3 mg/dL — ABNORMAL LOW (ref 8.9–10.3)
Calcium: 9.2 mg/dL (ref 8.9–10.3)
Chloride: 113 mmol/L — ABNORMAL HIGH (ref 98–111)
Chloride: 111 mmol/L (ref 98–111)
Creatinine, Ser: 2.88 mg/dL — ABNORMAL HIGH (ref 0.44–1.00)
Creatinine, Ser: 2.03 mg/dL — ABNORMAL HIGH (ref 0.44–1.00)
GFR, Estimated: 21 mL/min — ABNORMAL LOW (ref >60)
GFR, Estimated: 32 mL/min — ABNORMAL LOW (ref >60)
Glucose, Bld: 132 mg/dL — ABNORMAL HIGH (ref 70–99)
Glucose, Bld: 140 mg/dL — ABNORMAL HIGH (ref 70–99)
Potassium: 4.6 mmol/L (ref 3.5–5.1)
Potassium: 5.4 mmol/L — ABNORMAL HIGH (ref 3.5–5.1)
Sodium: 143 mmol/L (ref 135–145)
Sodium: 142 mmol/L (ref 135–145)
Total Bilirubin: 1.1 mg/dL (ref <1.2)
Total Bilirubin: 0.9 mg/dL (ref <1.2)
Total Protein: 7 g/dL (ref 6.5–8.1)
Total Protein: 8.1 g/dL (ref 6.5–8.1)

## 2023-07-07 LAB — URINALYSIS, ROUTINE W REFLEX MICROSCOPIC
Bilirubin Urine: NEGATIVE
Glucose, UA: NEGATIVE mg/dL
Hgb urine dipstick: NEGATIVE
Ketones, ur: NEGATIVE mg/dL
Leukocytes,Ua: NEGATIVE
Nitrite: NEGATIVE
Protein, ur: 30 mg/dL — AB
Specific Gravity, Urine: 1.019 (ref 1.005–1.030)
pH: 5 (ref 5.0–8.0)

## 2023-07-07 LAB — CBC
HCT: 56.9 % — ABNORMAL HIGH (ref 36.0–46.0)
Hemoglobin: 19.2 g/dL — ABNORMAL HIGH (ref 12.0–15.0)
MCH: 28.2 pg (ref 26.0–34.0)
MCHC: 33.7 g/dL (ref 30.0–36.0)
MCV: 83.6 fL (ref 80.0–100.0)
Platelets: 642 10*3/uL — ABNORMAL HIGH (ref 150–400)
RBC: 6.81 MIL/uL — ABNORMAL HIGH (ref 3.87–5.11)
RDW: 18.5 % — ABNORMAL HIGH (ref 11.5–15.5)
WBC: 22.5 10*3/uL — ABNORMAL HIGH (ref 4.0–10.5)
nRBC: 0 % (ref 0.0–0.2)

## 2023-07-07 LAB — BLOOD GAS, VENOUS
Acid-base deficit: 5.2 mmol/L — ABNORMAL HIGH (ref 0.0–2.0)
Bicarbonate: 20 mmol/L (ref 20.0–28.0)
O2 Saturation: 32.1 %
Patient temperature: 37.7
pCO2, Ven: 38 mm[Hg] — ABNORMAL LOW (ref 44–60)
pH, Ven: 7.33 (ref 7.25–7.43)
pO2, Ven: <31 mm[Hg] — CL (ref 32–45)

## 2023-07-07 LAB — RAPID URINE DRUG SCREEN, HOSP PERFORMED
Amphetamines: NOT DETECTED
Barbiturates: NOT DETECTED
Benzodiazepines: NOT DETECTED
Cocaine: NOT DETECTED
Opiates: NOT DETECTED
Tetrahydrocannabinol: NOT DETECTED

## 2023-07-07 LAB — MAGNESIUM
Magnesium: 1.9 mg/dL (ref 1.7–2.4)
Magnesium: 2.1 mg/dL (ref 1.7–2.4)

## 2023-07-07 LAB — I-STAT CG4 LACTIC ACID, ED
Lactic Acid, Venous: 6.6 mmol/L (ref 0.5–1.9)
Lactic Acid, Venous: 3.7 mmol/L (ref 0.5–1.9)
Lactic Acid, Venous: 2.7 mmol/L (ref 0.5–1.9)

## 2023-07-07 LAB — CBG MONITORING, ED: Glucose-Capillary: 224 mg/dL — ABNORMAL HIGH (ref 70–99)

## 2023-07-07 LAB — ACETAMINOPHEN LEVEL: Acetaminophen (Tylenol), Serum: <10 ug/mL — ABNORMAL LOW (ref 10–30)

## 2023-07-07 LAB — SALICYLATE LEVEL: Salicylate Lvl: <7 mg/dL — ABNORMAL LOW (ref 7.0–30.0)

## 2023-07-07 LAB — CK
Total CK: 486 U/L — ABNORMAL HIGH (ref 38–234)
Total CK: 575 U/L — ABNORMAL HIGH (ref 38–234)

## 2023-07-07 LAB — ETHANOL: Alcohol, Ethyl (B): <10 mg/dL (ref <10)

## 2023-07-07 LAB — LACTATE DEHYDROGENASE: LDH: 273 U/L — ABNORMAL HIGH (ref 98–192)

## 2023-07-07 MED ORDER — LORAZEPAM 2 MG/ML IJ SOLN
1.0000 mg | Freq: Once | INTRAMUSCULAR | Status: AC
  Administered 2023-07-07: 1 mg via INTRAVENOUS
  Filled 2023-07-07: qty 1

## 2023-07-07 MED ORDER — SODIUM CHLORIDE 0.9 % IV SOLN: Freq: Once | INTRAVENOUS | Status: AC

## 2023-07-07 MED ORDER — SODIUM CHLORIDE 0.9 % IV BOLUS
1000.0000 mL | Freq: Once | INTRAVENOUS | Status: AC
  Administered 2023-07-07: 1000 mL via INTRAVENOUS

## 2023-07-07 MED ORDER — LACTATED RINGERS IV SOLN: INTRAVENOUS | Status: AC

## 2023-07-07 MED ORDER — LACTATED RINGERS IV BOLUS: 1000.0000 mL | Freq: Once | INTRAVENOUS | Status: DC

## 2023-07-07 MED ORDER — LORAZEPAM 1 MG PO TABS: 1.0000 mg | ORAL_TABLET | Freq: Once | ORAL | Status: DC

## 2023-07-07 MED ORDER — SODIUM CHLORIDE 0.9 % IV SOLN
INTRAVENOUS | Status: DC
Start: 2023-07-07 — End: 2023-07-07

## 2023-07-07 MED ORDER — ENOXAPARIN SODIUM 30 MG/0.3ML IJ SOSY
30.0000 mg | PREFILLED_SYRINGE | INTRAMUSCULAR | Status: DC
  Administered 2023-07-07 – 2023-07-08 (×2): 30 mg via SUBCUTANEOUS
  Filled 2023-07-07 (×2): qty 0.3

## 2023-07-07 MED ORDER — POTASSIUM CHLORIDE 10 MEQ/100ML IV SOLN
10.0000 meq | INTRAVENOUS | Status: AC
  Administered 2023-07-07 (×2): 10 meq via INTRAVENOUS
  Filled 2023-07-07 (×2): qty 100

## 2023-07-07 NOTE — ED Notes (Signed)
Pt responded to RN questions. Pt says she feels a little better, no pain, and doesn't have to pee.

## 2023-07-07 NOTE — Assessment & Plan Note (Addendum)
Patient saw Psychiatry and was started on a new medication yesterday. Patient reported to have taken more of the meds than was prescribed. Jessica Walton believes this was not a suicide attempt. Based on poison control assessment and evaluation of literature, symptoms do match with olanzapine overdose. Patient received sedating doses of lorazepam in the ED. Treatment as below - Admit to family medicine service, Dr. Linwood Dibbles attending - Continue IV fluids until 10 AM tomorrow morning, 125 mL/h - Telemetry - Vitals per floor - Q6 bladder scans - Consider Foley - Poison control following, continue to follow - EKG q12H - Consider lorazepam for symptom control

## 2023-07-07 NOTE — ED Notes (Signed)
Pt states she does not want to harm herself.

## 2023-07-07 NOTE — H&P (Addendum)
Hospital Admission History and Physical Service Pager: [phone]  Patient name: Jessica Walton Medical record number: [id number] Date of Birth: 09/30/1985 Age: 37 y.o. Gender: female  Primary Care Provider: Pcp, No Consultants: none Code Status: Full  Preferred Emergency Contact:  Contact Information     Name Relation Home Work Mobile   Harris,Rex Significant other   [phone]      Other Contacts     Name Relation Home Work Mobile   [place] Mother [phone]  [phone]   Everrett Coombe   [phone]   Knickerbocker,Margie Mother   [phone]   Layla Maw   [phone]       Chief Complaint: olanzapine overdose  Assessment and Plan: LORRENE CANOVA is a 37 y.o. female presenting with overdose of olanzapine-samidorphan. Differential for presentation of this includes suicidal intent, accidental overdose, multidrug overdose, other mental health crisis such as psychotic episode, or manic episode. Given the patient has a reported history of schizophrenia, and substance use disorder, it is uncertain whether this event was intentional, accidental or a result of a medication reaction, or other precipitating mental health crisis.   Poison control recommends light sedation with benzodiazepines and supportive care until the medication washes out of her system.  Assessment & Plan Overdose Patient saw Psychiatry and was started on a new medication yesterday. Patient reported to have taken more of the meds than was prescribed. Steffanie Rainwater believes this was not a suicide attempt. Based on poison control assessment and evaluation of literature, symptoms do match with olanzapine overdose. Patient received sedating doses of lorazepam in the ED. Treatment as below - Admit to family medicine service, Dr. Linwood Dibbles attending - Continue IV fluids until 10 AM tomorrow morning, 125 mL/h - Telemetry - Vitals per floor - Q6 bladder scans - Consider Foley - Poison control  following, continue to follow - EKG q12H - Consider lorazepam for symptom control  AKI (acute kidney injury) (HCC) Patient Cr 2.88 on admission, most recent in 2023 was 1.3. Likely in the setting of drug toxicity vs poor PO?Marland Kitchen  Will continue to monitor and provide IV fluids. -mIVF as above - BMP q6h - K repletion 20 mEq IV Schizophrenia (HCC) Patient has history of anxiety, depression, PTSD and schizophrenia, according to fiancee. Patient only takes mirtazapine and prazosin. Holding home medications for now given drug toxicity.  Consider psych evaluation once patient is stable and awake. -Consider psych consult -Consider sitter Lactic acidosis Patient with elevated lactate of 6.6 down trended to 2.7. Patient with AG of 17. Suspect acidosis is in setting of overdose. Will continue IVF   Chronic and Stable Problems:  PTSD: Holding meds given current reason for hospitalization Depression: Holding home meds   FEN/GI: N.p.o. VTE Prophylaxis: Lovenox  Disposition: Progressive care  History of Present Illness:  Jessica Walton is a 37 y.o. female presenting with AMS in the setting of suspected olanzapine overdose.   Per fiance she started on a new medication yesterday and took two or three pills, then had an accident (bowel/bladder unclear) requiring clean up. She was altered following this episode and acting "crazy", taking a shower with her shirt on, getting into bed without drying off, and speaking incoherently. This was around 8pm last night. He reports that his home health aide also reported she was acting strangely and talking gibberish. This morning she reported having difficulty breathing and breathing heavy, and they then called EMS. Fiance reports that she has not gotten any better since arriving to the  ED.  Patient's fiance reports that she has seemed more depressed lately, she does not like to go out or be around a lot of people, and has been struggling with the loss of her husband  and son about 8 years ago. He reports that at times she can be angry when she's off her meds, and when she is on her meds they make her more withdrawn. He does not believe that she would do this intentionally. She has had hallucinations in the past.   In the ED, poison control was contacted and CBC, Lactic acid, VBG and CMP were drawn.  Creatinine was elevated, blood counts were normal.  Lactic acid was elevated but trending down.  EKG in ED showed supraventricular tachycardia with prolonged QT interval.  CT head was obtained, no abnormalities present.  Patient received 3 L normal saline, and 3 mg of Ativan, divided into 3 doses approximately 1-1/2 hours apart.  Review Of Systems: Patient unable to give ROS  Pertinent Past Medical History: Substance Use disorder PTSD Depression Schizophrenia Anxiety  Remainder reviewed in history tab.   Pertinent Past Surgical History: None   Remainder reviewed in history tab.  Pertinent Social History: Tobacco use: Yes- vaping Alcohol use: not recently per fiance Other Substance use: history of cocaine, opioids, THC. Fiance says she has not done cocaine since they have been together. Lives with fiance  Pertinent Family History: HTN, Diabetes - Maternal Grandmother Cousin Aiesha- substance use Mother- depression  Remainder reviewed in history tab.   Important Outpatient Medications: Olanzapine-Samidorphan 10-10 mg - new medication Mirtazapine 30 mg Prazosin 1 mg  Remainder reviewed in medication history.   Objective: BP (!) 143/109   Pulse (!) 139   Temp (!) 96.6 F (35.9 C) (Axillary)   Resp (!) 52   Ht 5\' 9"  (1.753 m)   Wt 72.6 kg   SpO2 99%   BMI 23.63 kg/m  Exam: General: Unresponsive to voice, minimally responsive to touch. Eyes: Open, fixed Cardiovascular: Tachycardic, no murmurs rubs gallops Respiratory: Tachypneic, coarse upper airway sounds, otherwise CTAB Gastrointestinal: Flat, soft, no guarding or apparent  masses. MSK: Appropriate bulk and tone, DTRs twitch responsive.  Spontaneous movement occasionally.  Labs:  CBC BMET  Recent Labs  Lab 07/07/23 1100 07/07/23 1144 07/07/23 1642  WBC 22.5*  --   --   HGB 19.2*   < > 13.6  HCT 56.9*   < > 40.0  PLT 642*  --   --    < > = values in this interval not displayed.   Recent Labs  Lab 07/07/23 1320 07/07/23 1642  NA 143 149*  K 4.6 3.3*  CL 113*  --   CO2 13*  --   BUN 49*  --   CREATININE 2.88*  --   GLUCOSE 132*  --   CALCIUM 8.3*  --      Lactic acid 6.6 > 3.7 > 2.7 Acetaminophen less than 10 Salicylate less than 7 Ethyl alcohol less than 10 UDS negative  EKG: Narrow complex supraventricular tachycardia, likely sinus tach, positive axis, no apparent ST elevations. prolonged QT interval  Imaging  CT head without contrast: No acute abnormality  Gerrit Heck, DO 07/07/2023, 4:53 PM PGY-1, [facility]  FPTS Intern pager: [phone], text pages welcome Secure chat group [facility] Teaching Service   Upper Level Addendum: I have seen and evaluated this patient along with Dr. Hyacinth Meeker and reviewed the above note, making necessary revisions as appropriate. I  agree with the medical decision making and physical exam as noted above. Bess Kinds, MD PGY-2 [facility]

## 2023-07-07 NOTE — ED Notes (Signed)
ED TO INPATIENT HANDOFF REPORT  ED Nurse Name and Phone #: Carollee Herter [phone]  S Name/Age/Gender Jessica Walton 37 y.o. female Room/Bed: 031C/031C  Code Status   Code Status: Full Code  Home/SNF/Other Home Pt isn't responding to orientation questions at this time Is this baseline? No   Triage Complete: Triage complete  Chief Complaint Overdose [T50.901A]  Triage Note Pt BIB EMS for a drug overdose/ AMS. Pt was found to be running around naked. Hx of schizo . Family states pt has been altered yesterday. Pt started lybalvi yesterday and was prescribed 7 pills, all pills were taken by pt in less than 24 hours. Pt is axox4 on arrival with a blank stare. HR 160's    Allergies Allergies  Allergen Reactions   Benadryl [Diphenhydramine Hcl] Other (See Comments)    Causes seizure activity   Vicodin [Hydrocodone-Acetaminophen] Rash and Other (See Comments)    And seizures. Can tolerate Tylenol    Level of Care/Admitting Diagnosis ED Disposition     ED Disposition  Admit   Condition  --   Comment  Hospital Area: [facility] [[id number]]  Level of Care: Progressive [102]  Admit to Progressive based on following criteria: ACUTE MENTAL DISORDER-RELATED Drug/Alcohol Ingestion/Overdose/Withdrawal, Suicidal Ideation/attempt requiring safety sitter and < Q2h monitoring/assessments, moderate to severe agitation that is managed with medication/sitter, CIWA-Ar score < 20.  May place patient in observation at [facility] or [facility] if equivalent level of care is available:: No  Covid Evaluation: Asymptomatic - no recent exposure (last 10 days) testing not required  Diagnosis: Overdose [202577]  Admitting Physician: Gerrit Heck [[id number]]  Attending Physician: Caro Laroche [[id number]]          B Medical/Surgery History Past Medical History:  Diagnosis Date   Asthma    No inhaler use x 1 yr (2013)   Bipolar 1 disorder (HCC)    Cocaine abuse (HCC) 2018    Headache    History of suicide attempt 2015   Hypertension    Lordosis    Opiate abuse (HCC) 2018   Opioid use disorder, severe, dependence (HCC) 07/25/2016   PTSD (post-traumatic stress disorder)    Seizures (HCC)    Tetrahydrocannabinol (THC) dependence (HCC)    Past Surgical History:  Procedure Laterality Date   EYE SURGERY     WISDOM TOOTH EXTRACTION     x4     A IV Location/Drains/Wounds Patient Lines/Drains/Airways Status     Active Line/Drains/Airways     Name Placement date Placement time Site Days   Peripheral IV 07/07/23 20 G Anterior;Proximal;Right Forearm 07/07/23  1108  Forearm  less than 1            Intake/Output Last 24 hours No intake or output data in the 24 hours ending 07/07/23 1750  Labs/Imaging Results for orders placed or performed during the hospital encounter of 07/07/23 (from the past 48 hours)  cbc     Status: Abnormal   Collection Time: 07/07/23 11:00 AM  Result Value Ref Range   WBC 22.5 (H) 4.0 - 10.5 K/uL   RBC 6.81 (H) 3.87 - 5.11 MIL/uL   Hemoglobin 19.2 (H) 12.0 - 15.0 g/dL   HCT 24.4 (H) 01.0 - 27.2 %    Comment: REPEATED TO VERIFY   MCV 83.6 80.0 - 100.0 fL   MCH 28.2 26.0 - 34.0 pg   MCHC 33.7 30.0 - 36.0 g/dL   RDW 53.6 (H) 64.4 - 03.4 %   Platelets 642 (  H) 150 - 400 K/uL   nRBC 0.0 0.0 - 0.2 %    Comment: Performed at [facility], [address], [place], [place] [address]  Rapid urine drug screen (hospital performed)     Status: None   Collection Time: 07/07/23 11:11 AM  Result Value Ref Range   Opiates NONE DETECTED NONE DETECTED   Cocaine NONE DETECTED NONE DETECTED   Benzodiazepines NONE DETECTED NONE DETECTED   Amphetamines NONE DETECTED NONE DETECTED   Tetrahydrocannabinol NONE DETECTED NONE DETECTED   Barbiturates NONE DETECTED NONE DETECTED    Comment: (NOTE) DRUG SCREEN FOR MEDICAL PURPOSES ONLY.  IF CONFIRMATION IS NEEDED FOR ANY PURPOSE, NOTIFY LAB WITHIN 5 DAYS.  LOWEST DETECTABLE LIMITS FOR  URINE DRUG SCREEN Drug Class                     Cutoff (ng/mL) Amphetamine and metabolites    1000 Barbiturate and metabolites    200 Benzodiazepine                 200 Opiates and metabolites        300 Cocaine and metabolites        300 THC                            50 Performed at [facility], [address] [place], [place] [address]   I-Stat CG4 Lactic Acid     Status: Abnormal   Collection Time: 07/07/23 11:29 AM  Result Value Ref Range   Lactic Acid, Venous 6.6 (HH) 0.5 - 1.9 mmol/L   Comment NOTIFIED PHYSICIAN   CBG monitoring, ED     Status: Abnormal   Collection Time: 07/07/23 11:36 AM  Result Value Ref Range   Glucose-Capillary 224 (H) 70 - 99 mg/dL    Comment: Glucose reference range applies only to samples taken after fasting for at least 8 hours.  I-Stat venous blood gas, ([facility], [facility], [facility])     Status: Abnormal   Collection Time: 07/07/23 11:44 AM  Result Value Ref Range   pH, Ven 7.284 7.25 - 7.43   pCO2, Ven 36.4 (L) 44 - 60 mmHg   pO2, Ven 154 (H) 32 - 45 mmHg   Bicarbonate 17.3 (L) 20.0 - 28.0 mmol/L   TCO2 18 (L) 22 - 32 mmol/L   O2 Saturation 99 %   Acid-base deficit 8.0 (H) 0.0 - 2.0 mmol/L   Sodium 133 (L) 135 - 145 mmol/L   Potassium >8.5 (HH) 3.5 - 5.1 mmol/L   Calcium, Ion 1.02 (L) 1.15 - 1.40 mmol/L   HCT 60.0 (H) 36.0 - 46.0 %   Hemoglobin 20.4 (H) 12.0 - 15.0 g/dL   Sample type VENOUS    Comment NOTIFIED PHYSICIAN   Acetaminophen level     Status: Abnormal   Collection Time: 07/07/23  1:20 PM  Result Value Ref Range   Acetaminophen (Tylenol), Serum <10 (L) 10 - 30 ug/mL    Comment: (NOTE) Therapeutic concentrations vary significantly. A range of 10-30 ug/mL  may be an effective concentration for many patients. However, some  are best treated at concentrations outside of this range. Acetaminophen concentrations >150 ug/mL at 4 hours after ingestion  and >50 ug/mL at 12 hours after ingestion are often associated with  toxic  reactions.  Performed at [facility], [address] [place], [place] [address]   Ethanol  Status: None   Collection Time: 07/07/23  1:20 PM  Result Value Ref Range   Alcohol, Ethyl (B) <10 <10 mg/dL    Comment: (NOTE) Lowest detectable limit for serum alcohol is 10 mg/dL.  For medical purposes only. Performed at [facility], [address] [place], [place] [address]   CK     Status: Abnormal   Collection Time: 07/07/23  1:20 PM  Result Value Ref Range   Total CK 486 (H) 38 - 234 U/L    Comment: HEMOLYSIS AT THIS LEVEL MAY AFFECT RESULT Performed at [facility], [address], [place], [place] [address]   Comprehensive metabolic panel     Status: Abnormal   Collection Time: 07/07/23  1:20 PM  Result Value Ref Range   Sodium 143 135 - 145 mmol/L   Potassium 4.6 3.5 - 5.1 mmol/L    Comment: HEMOLYSIS AT THIS LEVEL MAY AFFECT RESULT   Chloride 113 (H) 98 - 111 mmol/L   CO2 13 (L) 22 - 32 mmol/L   Glucose, Bld 132 (H) 70 - 99 mg/dL    Comment: Glucose reference range applies only to samples taken after fasting for at least 8 hours.   BUN 49 (H) 6 - 20 mg/dL   Creatinine, Ser 2.95 (H) 0.44 - 1.00 mg/dL   Calcium 8.3 (L) 8.9 - 10.3 mg/dL   Total Protein 7.0 6.5 - 8.1 g/dL   Albumin 3.5 3.5 - 5.0 g/dL   AST 48 (H) 15 - 41 U/L    Comment: HEMOLYSIS AT THIS LEVEL MAY AFFECT RESULT   ALT 25 0 - 44 U/L    Comment: HEMOLYSIS AT THIS LEVEL MAY AFFECT RESULT   Alkaline Phosphatase 58 38 - 126 U/L   Total Bilirubin 1.1 <1.2 mg/dL    Comment: HEMOLYSIS AT THIS LEVEL MAY AFFECT RESULT   GFR, Estimated 21 (L) >60 mL/min    Comment: (NOTE) Calculated using the CKD-EPI Creatinine Equation (2021)    Anion gap 17 (H) 5 - 15    Comment: Performed at [facility], [address] [place], [place] [address]  Magnesium     Status: None   Collection Time: 07/07/23  1:20 PM  Result Value Ref Range   Magnesium 1.9 1.7 - 2.4 mg/dL    Comment: HEMOLYSIS AT  THIS LEVEL MAY AFFECT RESULT Performed at [facility], [address] [place], [place] [address]   Salicylate level     Status: Abnormal   Collection Time: 07/07/23  1:20 PM  Result Value Ref Range   Salicylate Lvl <7.0 (L) 7.0 - 30.0 mg/dL    Comment: Performed at [facility], [address] [place], [place] [address]  I-Stat CG4 Lactic Acid     Status: Abnormal   Collection Time: 07/07/23  1:25 PM  Result Value Ref Range   Lactic Acid, Venous 3.7 (HH) 0.5 - 1.9 mmol/L   Comment NOTIFIED PHYSICIAN   I-Stat venous blood gas, ED     Status: Abnormal   Collection Time: 07/07/23  4:42 PM  Result Value Ref Range   pH, Ven 7.266 7.25 - 7.43   pCO2, Ven 29.6 (L) 44 - 60 mmHg   pO2, Ven 95 (H) 32 - 45 mmHg   Bicarbonate 13.5 (L) 20.0 - 28.0 mmol/L   TCO2 14 (L) 22 - 32 mmol/L   O2 Saturation 96 %   Acid-base deficit 12.0 (H) 0.0 - 2.0 mmol/L  Sodium 149 (H) 135 - 145 mmol/L   Potassium 3.3 (L) 3.5 - 5.1 mmol/L   Calcium, Ion 0.94 (L) 1.15 - 1.40 mmol/L   HCT 40.0 36.0 - 46.0 %   Hemoglobin 13.6 12.0 - 15.0 g/dL   Sample type VENOUS   I-Stat Lactic Acid     Status: Abnormal   Collection Time: 07/07/23  4:42 PM  Result Value Ref Range   Lactic Acid, Venous 2.7 (HH) 0.5 - 1.9 mmol/L   Comment NOTIFIED PHYSICIAN    CT HEAD WO CONTRAST Result Date: 07/07/2023 CLINICAL DATA:  Altered mental status.  Drug overdose. EXAM: CT HEAD WITHOUT CONTRAST TECHNIQUE: Contiguous axial images were obtained from the base of the skull through the vertex without intravenous contrast. RADIATION DOSE REDUCTION: This exam was performed according to the departmental dose-optimization program which includes automated exposure control, adjustment of the mA and/or kV according to patient size and/or use of iterative reconstruction technique. COMPARISON:  Head CT 08/28/2018. FINDINGS: Brain: No acute intracranial hemorrhage. Gray-white differentiation is preserved. No hydrocephalus or extra-axial  collection. No mass effect or midline shift. Vascular: No hyperdense vessel or unexpected calcification. Skull: No calvarial fracture or suspicious bone lesion. Skull base is unremarkable. Sinuses/Orbits: No acute finding. Other: None. IMPRESSION: No acute intracranial abnormality. Electronically Signed   By: Orvan Falconer M.D.   On: 07/07/2023 13:36   DG Chest Port 1 View Result Date: 07/07/2023 CLINICAL DATA:  Shortness of breath. EXAM: PORTABLE CHEST 1 VIEW COMPARISON:  09/18/2018. FINDINGS: Low lung volume. Bilateral lung fields are clear. Note is made of elevated right hemidiaphragm. Bilateral costophrenic angles are clear. Normal cardio-mediastinal silhouette. No acute osseous abnormalities. The soft tissues are within normal limits. IMPRESSION: No active disease. Electronically Signed   By: Jules Schick M.D.   On: 07/07/2023 12:00    Pending Labs Unresulted Labs (From admission, onward)     Start     Ordered   07/08/23 0500  Comprehensive metabolic panel  Tomorrow morning,   R        07/07/23 1731   07/08/23 0500  CBC  Tomorrow morning,   R        07/07/23 1731            Vitals/Pain Today's Vitals   07/07/23 1600 07/07/23 1630 07/07/23 1700 07/07/23 1706  BP: (!) 143/109 (!) 141/110 (!) 150/117   Pulse: (!) 139 (!) 133 (!) 132   Resp: (!) 52 (!) 61 (!) 53   Temp:      TempSrc:      SpO2: 99% 99% 99%   Weight:      Height:      PainSc:    0-No pain    Isolation Precautions No active isolations  Medications Medications  sodium chloride 0.9 % bolus 1,000 mL (0 mLs Intravenous Stopped 07/07/23 1448)    And  0.9 %  sodium chloride infusion ( Intravenous New Bag/Given 07/07/23 1126)  enoxaparin (LOVENOX) injection 30 mg (has no administration in time range)  LORazepam (ATIVAN) injection 1 mg (1 mg Intravenous Given 07/07/23 1210)  0.9 %  sodium chloride infusion (0 mLs Intravenous Stopped 07/07/23 1316)  LORazepam (ATIVAN) injection 1 mg (1 mg Intravenous Given  07/07/23 1342)  LORazepam (ATIVAN) injection 1 mg (1 mg Intravenous Given 07/07/23 1605)  sodium chloride 0.9 % bolus 1,000 mL (1,000 mLs Intravenous New Bag/Given 07/07/23 1604)    Mobility walks     Focused Assessments    R Recommendations: See Admitting  Provider Note  Report given to:   Additional Notes:

## 2023-07-07 NOTE — ED Notes (Signed)
RN came to speak with pt. Pt kept gaze to left side of wall and would not respond to any questions. When RN did a sternum rub pt started breathing fast.    When RN was leaving out room, I heard the bed squeak. RN turned around and saw pt moving legs and head to the right. When pt made eye contact with RN she went limp and gazed at wall. RN ask pt if she need any thing or was in pain. Pt just started breathing fast and looking at wall.

## 2023-07-07 NOTE — ED Notes (Signed)
Poison control called RN. Poison control recommended repeat EKG, bladder scan pt, and more benzos. MD notified.

## 2023-07-07 NOTE — Plan of Care (Signed)
FMTS Brief Progress Note  S: Rounded with Dr. Georg Ruddle.  Patient was somewhat arousable to voice and shaking.  Able to state her name.  Declined being in any pain or having difficulty breathing.  Continue to fall asleep.  O: BP (!) 153/107 (BP Location: Left Arm)   Pulse (!) 135   Temp 99.8 F (37.7 C) (Oral)   Resp (!) 47   Ht 5\' 9"  (1.753 m)   Wt 72.6 kg   SpO2 98%   BMI 23.63 kg/m   General: Somnolent however arousable and responsive to commands HEENT: Pinpoint pupils ~38mm bilaterally CV: Tachycardic, no appreciable murmurs Pulm: Tachypneic, CTAB, normal WOB, on room air Abdomen: Soft, distended Neuro: 5/5 strength in b/l U/L extremities, no appreciable clonus, hyporeflexive  A/P: Medication overdose Discussed with poison control (toxicologist) prior to rounding regarding concerning presentation and significant tachycardia, tachypnea, hypertension.  On review, patient presented with fever 100.5 which has improved and additionally elevated vitals which have remained consistent.  Toxicologist has minimal concern for NMS and serotonin syndrome.  Presentation consistent with anticholinergic effect which is profound for olanzapine.  Suspect this is also playing a role on urinary retention which she had bladder scan in the ED with approximately 700 mL however did not receive intervention for this.  Further recommendations by toxicologist included benzodiazepines for agitation, serial EKGs for QTc, normal to high ranges for potassium magnesium.  Lactic acid appears to be improving with fluids.  VBG with pH low at 7.26 with low pCO2 and compensatory low bicarb suggesting compensated metabolic acidosis and additionally checked using Winters formula. -Continuous cardiac monitoring -Serial EKGs for QTc purposes -Adjust fluids to LR at 135mL/hr -UA, VBG, CMP, CBC with differential, magnesium, blood cultures, LDH -BMP, CBC, VBG every 6 hours; low threshold for CCM consult if respiratory status  changes or VBG worsens -Goal K 4.5, magnesium 2 -Bladder scan, Place Foley catheter if >319mL - Orders reviewed. Labs for AM ordered, which was adjusted as needed.   Shelby Mattocks, DO 07/07/2023, 10:01 PM PGY-3, [facility] Night Resident  Please page [phone] with questions.

## 2023-07-07 NOTE — Progress Notes (Signed)
   07/07/23 1914  Vitals  Temp 99.8 F (37.7 C)  Temp Source Oral  BP (!) 153/107  MAP (mmHg) 122  BP Location Left Arm  BP Method Automatic  Patient Position (if appropriate) Lying  Pulse Rate (!) 135  Pulse Rate Source Monitor  ECG Heart Rate (!) 133  Resp (!) 47  Level of Consciousness  Level of Consciousness Responds to Voice  MEWS COLOR  MEWS Score Color Red  Oxygen Therapy  SpO2 98 %  O2 Device Nasal Cannula  O2 Flow Rate (L/min) 2 L/min  MEWS Score  MEWS Temp 0  MEWS Systolic 0  MEWS Pulse 3  MEWS RR 3  MEWS LOC 1  MEWS Score 7   Pt admitted to [id number]. Pt oriented to unit.Pt made comfortable. CHG bath given. Call light by pt. CCMD called.

## 2023-07-07 NOTE — Plan of Care (Signed)
  Problem: Education: Goal: Knowledge of General Education information will improve Description: Including pain rating scale, medication(s)/side effects and non-pharmacologic comfort measures Outcome: Progressing   Problem: Health Behavior/Discharge Planning: Goal: Ability to manage health-related needs will improve Outcome: Progressing   Problem: Clinical Measurements: Goal: Ability to maintain clinical measurements within normal limits will improve Outcome: Progressing Goal: Will remain free from infection Outcome: Progressing Goal: Diagnostic test results will improve Outcome: Progressing Goal: Respiratory complications will improve Outcome: Progressing Goal: Cardiovascular complication will be avoided Outcome: Progressing   Problem: Elimination: Goal: Will not experience complications related to urinary retention Outcome: Progressing   Problem: Pain Management: Goal: General experience of comfort will improve Outcome: Progressing   Problem: Safety: Goal: Ability to remain free from injury will improve Outcome: Progressing

## 2023-07-07 NOTE — ED Notes (Signed)
RN spoke with poison control- Patty. Patty states to give benzos for HR if needed, give IV fluids, and monitor for at least 12 hours. MD notified.

## 2023-07-07 NOTE — Assessment & Plan Note (Addendum)
Patient has history of anxiety, depression, PTSD and schizophrenia, according to fiancee. Patient only takes mirtazapine and prazosin. Holding home medications for now given drug toxicity.  Consider psych evaluation once patient is stable and awake. -Consider psych consult -Consider sitter

## 2023-07-07 NOTE — ED Notes (Signed)
RN sent down repeat labs to lab

## 2023-07-07 NOTE — ED Notes (Signed)
Bladder scan done @ this time, 298 mL

## 2023-07-07 NOTE — ED Notes (Signed)
Please update  [phone]

## 2023-07-07 NOTE — ED Notes (Signed)
Dr. Theresia Lo recommends to wait two hours to bladder scan again before I/O.

## 2023-07-07 NOTE — Assessment & Plan Note (Signed)
Patient with elevated lactate of 6.6 down trended to 2.7. Patient with AG of 17. Suspect acidosis is in setting of overdose. Will continue IVF

## 2023-07-07 NOTE — Assessment & Plan Note (Addendum)
Patient Cr 2.88 on admission, most recent in 2023 was 1.3. Likely in the setting of drug toxicity vs poor PO?Marland Kitchen  Will continue to monitor and provide IV fluids. -mIVF as above - BMP q6h - K repletion 20 mEq IV

## 2023-07-07 NOTE — ED Provider Notes (Signed)
[place] EMERGENCY DEPARTMENT AT [facility] Provider Note   CSN: [id number] Arrival date & time: 07/07/23  1038     History  Chief Complaint  Patient presents with   Drug Overdose   Altered Mental Status    Jessica Walton is a 37 y.o. female.  Is a 37 year old female with a past medical history of schizophrenia and substance use presenting to the emergency department with altered mental status and accidental overdose.  Per EMS, 911 was called for the patient running around naked.  Family reports that the patient was recently prescribed liability yesterday and was given 7 tabs and they state that she is taken all 7 tabs in the last 24 hours.  They state that she has been altered since yesterday.  Patient denies any SI and states that she is unsure why she took all these medications and does not remember taking them or remember what happened this morning.  She denies any coingestions or any drug or alcohol use.  She states that she is having a headache with some nausea and intermittent vomiting.  She denies any other complaints at this time.  The history is provided by the patient and the EMS personnel.  Drug Overdose  Altered Mental Status      Home Medications Prior to Admission medications   Medication Sig Start Date End Date Taking? Authorizing Provider  mirtazapine (REMERON) 30 MG tablet Take 1 tablet (30 mg total) by mouth at bedtime. 07/06/23  Yes Nwoko, Uchenna E, PA  OLANZapine (ZYPREXA) 5 MG tablet Take 1 tablet (5 mg total) by mouth daily. 06/06/23 06/05/24 Yes Nwoko, Tommas Olp, PA  OLANZapine-Samidorphan (LYBALVI) 10-10 MG TABS Take 1 tablet by mouth 2 (two) times daily. 07/06/23  Yes Nwoko, Tommas Olp, PA  benztropine (COGENTIN) 1 MG tablet Take 0.5 tablets (0.5 mg total) by mouth daily. Patient not taking: Reported on 07/07/2023 11/02/22 11/02/23  Meta Hatchet, PA  gabapentin (NEURONTIN) 100 MG capsule Take 2 capsules (200 mg total) by mouth 3 (three)  times daily. Patient not taking: Reported on 07/07/2023 07/06/23   Karel Jarvis E, PA  prazosin (MINIPRESS) 1 MG capsule TAKE 1 CAPSULE BY MOUTH AT BEDTIME. Patient not taking: Reported on 07/07/2023 03/14/23   Meta Hatchet, PA      Allergies    Benadryl [diphenhydramine hcl] and Vicodin [hydrocodone-acetaminophen]    Review of Systems   Review of Systems  Physical Exam Updated Vital Signs BP (!) 162/109   Pulse (!) 144   Temp (!) 96.6 F (35.9 C) (Axillary)   Resp (!) 46   Ht 5\' 9"  (1.753 m)   Wt 72.6 kg   SpO2 100%   BMI 23.63 kg/m  Physical Exam Vitals and nursing note reviewed.  Constitutional:      General: She is not in acute distress.    Appearance: Normal appearance. She is diaphoretic (mildly moist skin).  HENT:     Head: Normocephalic and atraumatic.     Nose: Nose normal.     Mouth/Throat:     Mouth: Mucous membranes are moist.     Pharynx: Oropharynx is clear.  Eyes:     Extraocular Movements: Extraocular movements intact.     Conjunctiva/sclera: Conjunctivae normal.     Pupils: Pupils are equal, round, and reactive to light.     Comments: Pupils appropriate to slightly large for light in the room  Cardiovascular:     Rate and Rhythm: Regular rhythm. Tachycardia present.  Heart sounds: Normal heart sounds.  Pulmonary:     Breath sounds: Normal breath sounds.     Comments: Tachypneic Abdominal:     General: Abdomen is flat.     Palpations: Abdomen is soft.     Tenderness: There is no abdominal tenderness.  Musculoskeletal:        General: Normal range of motion.     Cervical back: Normal range of motion.     Right lower leg: No edema.     Left lower leg: No edema.  Skin:    General: Skin is warm.  Neurological:     Mental Status: She is alert.     Sensory: No sensory deficit.     Motor: No weakness.     Comments: Oriented to person and place only  Psychiatric:     Comments: Mildly anxious appearing, otherwise calm and cooperative      ED Results / Procedures / Treatments   Labs (all labs ordered are listed, but only abnormal results are displayed) Labs Reviewed  CBC - Abnormal; Notable for the following components:      Result Value   WBC 22.5 (*)    RBC 6.81 (*)    Hemoglobin 19.2 (*)    HCT 56.9 (*)    RDW 18.5 (*)    Platelets 642 (*)    All other components within normal limits  ACETAMINOPHEN LEVEL - Abnormal; Notable for the following components:   Acetaminophen (Tylenol), Serum <10 (*)    All other components within normal limits  CK - Abnormal; Notable for the following components:   Total CK 486 (*)    All other components within normal limits  COMPREHENSIVE METABOLIC PANEL - Abnormal; Notable for the following components:   Chloride 113 (*)    CO2 13 (*)    Glucose, Bld 132 (*)    BUN 49 (*)    Creatinine, Ser 2.88 (*)    Calcium 8.3 (*)    AST 48 (*)    GFR, Estimated 21 (*)    Anion gap 17 (*)    All other components within normal limits  SALICYLATE LEVEL - Abnormal; Notable for the following components:   Salicylate Lvl <7.0 (*)    All other components within normal limits  CBG MONITORING, ED - Abnormal; Notable for the following components:   Glucose-Capillary 224 (*)    All other components within normal limits  I-STAT CG4 LACTIC ACID, ED - Abnormal; Notable for the following components:   Lactic Acid, Venous 6.6 (*)    All other components within normal limits  I-STAT VENOUS BLOOD GAS, ED - Abnormal; Notable for the following components:   pCO2, Ven 36.4 (*)    pO2, Ven 154 (*)    Bicarbonate 17.3 (*)    TCO2 18 (*)    Acid-base deficit 8.0 (*)    Sodium 133 (*)    Potassium >8.5 (*)    Calcium, Ion 1.02 (*)    HCT 60.0 (*)    Hemoglobin 20.4 (*)    All other components within normal limits  I-STAT CG4 LACTIC ACID, ED - Abnormal; Notable for the following components:   Lactic Acid, Venous 3.7 (*)    All other components within normal limits  RAPID URINE DRUG SCREEN, HOSP  PERFORMED  ETHANOL  MAGNESIUM  I-STAT VENOUS BLOOD GAS, ED  I-STAT CG4 LACTIC ACID, ED    EKG None  Radiology CT HEAD WO CONTRAST Result Date: 07/07/2023 CLINICAL DATA:  Altered  mental status.  Drug overdose. EXAM: CT HEAD WITHOUT CONTRAST TECHNIQUE: Contiguous axial images were obtained from the base of the skull through the vertex without intravenous contrast. RADIATION DOSE REDUCTION: This exam was performed according to the departmental dose-optimization program which includes automated exposure control, adjustment of the mA and/or kV according to patient size and/or use of iterative reconstruction technique. COMPARISON:  Head CT 08/28/2018. FINDINGS: Brain: No acute intracranial hemorrhage. Gray-white differentiation is preserved. No hydrocephalus or extra-axial collection. No mass effect or midline shift. Vascular: No hyperdense vessel or unexpected calcification. Skull: No calvarial fracture or suspicious bone lesion. Skull base is unremarkable. Sinuses/Orbits: No acute finding. Other: None. IMPRESSION: No acute intracranial abnormality. Electronically Signed   By: Orvan Falconer M.D.   On: 07/07/2023 13:36   DG Chest Port 1 View Result Date: 07/07/2023 CLINICAL DATA:  Shortness of breath. EXAM: PORTABLE CHEST 1 VIEW COMPARISON:  09/18/2018. FINDINGS: Low lung volume. Bilateral lung fields are clear. Note is made of elevated right hemidiaphragm. Bilateral costophrenic angles are clear. Normal cardio-mediastinal silhouette. No acute osseous abnormalities. The soft tissues are within normal limits. IMPRESSION: No active disease. Electronically Signed   By: Jules Schick M.D.   On: 07/07/2023 12:00    Procedures .Critical Care  Performed by: Rexford Maus, DO Authorized by: Rexford Maus, DO   Critical care provider statement:    Critical care time (minutes):  30   Critical care was necessary to treat or prevent imminent or life-threatening deterioration of the  following conditions:  Toxidrome   Critical care was time spent personally by me on the following activities:  Development of treatment plan with patient or surrogate, discussions with consultants, evaluation of patient's response to treatment, examination of patient, ordering and review of laboratory studies, ordering and review of radiographic studies, ordering and performing treatments and interventions, pulse oximetry, re-evaluation of patient's condition and review of old charts     Medications Ordered in ED Medications  sodium chloride 0.9 % bolus 1,000 mL (0 mLs Intravenous Stopped 07/07/23 1448)    And  0.9 %  sodium chloride infusion ( Intravenous New Bag/Given 07/07/23 1126)  LORazepam (ATIVAN) injection 1 mg (has no administration in time range)  sodium chloride 0.9 % bolus 1,000 mL (has no administration in time range)  LORazepam (ATIVAN) injection 1 mg (1 mg Intravenous Given 07/07/23 1210)  0.9 %  sodium chloride infusion (0 mLs Intravenous Stopped 07/07/23 1316)  LORazepam (ATIVAN) injection 1 mg (1 mg Intravenous Given 07/07/23 1342)    ED Course/ Medical Decision Making/ A&P Clinical Course as of 07/07/23 1549  Fri Jul 07, 2023  1203 Patient mildly hyperthermic, will have ice packs placed to cool. Receiving IVF for elevated lactic. Elevated potassium on VBG but suspect hemolysis, no hyperkalemic EKG changes. Poison control recommended IVF, benzos PRN and 12 hr observation. [VK]  1232 Leukocytosis likely reactionary to intoxication, also likely component of hemoconcentration with elevated Hgb and platetlets. Will have 2nd liter of IVF. [VK]  1329 Lactic down trending. Patient has mild improvement of HR, still tachypneic. VBG shows mild metabolic acidosis with respiratory compensation. Will be given 2nd dose of ativan. [VK]  1547 Bladder scan 298. Still tachycardic and tachypenic. Ordered 3rd bolus and 3rd dose of ativan. Patient will be admitted for further monitoring. Will  repeat VBG and lactic. [VK]    Clinical Course User Index [VK] Rexford Maus, DO  Medical Decision Making This patient presents to the ED with chief complaint(s) of AMS with pertinent past medical history of schizophrenia, substance use which further complicates the presenting complaint. The complaint involves an extensive differential diagnosis and also carries with it a high risk of complications and morbidity.    The differential diagnosis includes intoxication/toxidrome, accidental overdose, electrolyte abnormality, arrhythmia, anemia, considering ICH/mass effect with headache and altered mental status, psychosis  Additional history obtained: Additional history obtained from EMS  Records reviewed outpatient psych records  ED Course and Reassessment: On patient's arrival she is tachycardic and tachypneic and mildly diaphoretic, consistent with a toxidrome.  Will consult poison control in the setting of her possible overdose and altered mental status.  Will have labs to evaluate for possible COVID ingestion or other etiology for her altered mental status and will be closely reassessed.  Independent labs interpretation:  The following labs were independently interpreted: AKI Cr 2.8 from baseline normal, lactic acidosis, leukocytosis and increased Hgb likely related to hemoconcentration  Independent visualization of imaging: - I independently visualized the following imaging with scope of interpretation limited to determining acute life threatening conditions related to emergency care: CXR, CTH, which revealed no acute disease  Consultation: - Consulted or discussed management/test interpretation w/ external professional: poison control, hospitalist  Consideration for admission or further workup: patient requires admission for management of her ingestion and AKI Social Determinants of health: substance use    Amount and/or Complexity of Data  Reviewed Labs: ordered. Radiology: ordered.  Risk Prescription drug management. Decision regarding hospitalization.          Final Clinical Impression(s) / ED Diagnoses Final diagnoses:  Overdose of undetermined intent, initial encounter  AKI (acute kidney injury) [facility])    Rx / DC Orders ED Discharge Orders     None         Rexford Maus, DO 07/07/23 1549

## 2023-07-07 NOTE — ED Notes (Signed)
RN spoke with pt fiance'. RN informed him pt will be admitted and no new updates at this time. Fiance' states hasn't done this before and seemed excited starting new medicine from doctor. Fiance' doesn't have any additional questions at this time.

## 2023-07-07 NOTE — ED Notes (Addendum)
Poison control would like a repeat CMP and CK.   Repeat EKG 21:00  Give more benzos

## 2023-07-07 NOTE — ED Triage Notes (Addendum)
Pt BIB EMS for a drug overdose/ AMS. Pt was found to be running around naked. Hx of schizo . Family states pt has been altered yesterday. Pt started lybalvi yesterday and was prescribed 7 pills, all pills were taken by pt in less than 24 hours. Pt is axox4 on arrival with a blank stare. HR 160's

## 2023-07-08 ENCOUNTER — Observation Stay (HOSPITAL_COMMUNITY): Payer: MEDICAID

## 2023-07-08 DIAGNOSIS — Z8241 Family history of sudden cardiac death: Secondary | ICD-10-CM | POA: Diagnosis not present

## 2023-07-08 DIAGNOSIS — Z8249 Family history of ischemic heart disease and other diseases of the circulatory system: Secondary | ICD-10-CM | POA: Diagnosis not present

## 2023-07-08 DIAGNOSIS — K2970 Gastritis, unspecified, without bleeding: Secondary | ICD-10-CM | POA: Diagnosis not present

## 2023-07-08 DIAGNOSIS — F209 Schizophrenia, unspecified: Secondary | ICD-10-CM

## 2023-07-08 DIAGNOSIS — I10 Essential (primary) hypertension: Secondary | ICD-10-CM | POA: Diagnosis present

## 2023-07-08 DIAGNOSIS — F310 Bipolar disorder, current episode hypomanic: Secondary | ICD-10-CM | POA: Diagnosis present

## 2023-07-08 DIAGNOSIS — T43501A Poisoning by unspecified antipsychotics and neuroleptics, accidental (unintentional), initial encounter: Secondary | ICD-10-CM | POA: Diagnosis present

## 2023-07-08 DIAGNOSIS — R6511 Systemic inflammatory response syndrome (SIRS) of non-infectious origin with acute organ dysfunction: Secondary | ICD-10-CM | POA: Diagnosis present

## 2023-07-08 DIAGNOSIS — F3171 Bipolar disorder, in partial remission, most recent episode hypomanic: Secondary | ICD-10-CM | POA: Diagnosis not present

## 2023-07-08 DIAGNOSIS — D72829 Elevated white blood cell count, unspecified: Secondary | ICD-10-CM | POA: Diagnosis present

## 2023-07-08 DIAGNOSIS — T43504A Poisoning by unspecified antipsychotics and neuroleptics, undetermined, initial encounter: Secondary | ICD-10-CM

## 2023-07-08 DIAGNOSIS — R339 Retention of urine, unspecified: Secondary | ICD-10-CM | POA: Diagnosis not present

## 2023-07-08 DIAGNOSIS — Z885 Allergy status to narcotic agent status: Secondary | ICD-10-CM | POA: Diagnosis not present

## 2023-07-08 DIAGNOSIS — F4310 Post-traumatic stress disorder, unspecified: Secondary | ICD-10-CM | POA: Diagnosis present

## 2023-07-08 DIAGNOSIS — E872 Acidosis, unspecified: Secondary | ICD-10-CM | POA: Diagnosis present

## 2023-07-08 DIAGNOSIS — T402X1A Poisoning by other opioids, accidental (unintentional), initial encounter: Secondary | ICD-10-CM | POA: Diagnosis not present

## 2023-07-08 DIAGNOSIS — Z833 Family history of diabetes mellitus: Secondary | ICD-10-CM | POA: Diagnosis not present

## 2023-07-08 DIAGNOSIS — N179 Acute kidney failure, unspecified: Secondary | ICD-10-CM | POA: Diagnosis present

## 2023-07-08 DIAGNOSIS — Z5329 Procedure and treatment not carried out because of patient's decision for other reasons: Secondary | ICD-10-CM | POA: Diagnosis present

## 2023-07-08 DIAGNOSIS — R4182 Altered mental status, unspecified: Secondary | ICD-10-CM | POA: Diagnosis present

## 2023-07-08 DIAGNOSIS — F419 Anxiety disorder, unspecified: Secondary | ICD-10-CM | POA: Diagnosis not present

## 2023-07-08 DIAGNOSIS — Z87891 Personal history of nicotine dependence: Secondary | ICD-10-CM | POA: Diagnosis not present

## 2023-07-08 DIAGNOSIS — Z9151 Personal history of suicidal behavior: Secondary | ICD-10-CM | POA: Diagnosis not present

## 2023-07-08 DIAGNOSIS — Z888 Allergy status to other drugs, medicaments and biological substances status: Secondary | ICD-10-CM | POA: Diagnosis not present

## 2023-07-08 DIAGNOSIS — Z79899 Other long term (current) drug therapy: Secondary | ICD-10-CM | POA: Diagnosis not present

## 2023-07-08 DIAGNOSIS — F411 Generalized anxiety disorder: Secondary | ICD-10-CM | POA: Diagnosis present

## 2023-07-08 LAB — CBC
HCT: 51.9 % — ABNORMAL HIGH (ref 36.0–46.0)
Hemoglobin: 17.7 g/dL — ABNORMAL HIGH (ref 12.0–15.0)
MCH: 28.1 pg (ref 26.0–34.0)
MCHC: 34.1 g/dL (ref 30.0–36.0)
MCV: 82.3 fL (ref 80.0–100.0)
Platelets: 488 10*3/uL — ABNORMAL HIGH (ref 150–400)
RBC: 6.31 MIL/uL — ABNORMAL HIGH (ref 3.87–5.11)
RDW: 17.6 % — ABNORMAL HIGH (ref 11.5–15.5)
WBC: 21.1 10*3/uL — ABNORMAL HIGH (ref 4.0–10.5)
nRBC: 0 % (ref 0.0–0.2)

## 2023-07-08 LAB — COMPREHENSIVE METABOLIC PANEL
ALT: 23 U/L (ref 0–44)
AST: 31 U/L (ref 15–41)
Albumin: 3.9 g/dL (ref 3.5–5.0)
Alkaline Phosphatase: 66 U/L (ref 38–126)
Anion gap: 12 (ref 5–15)
BUN: 39 mg/dL — ABNORMAL HIGH (ref 6–20)
CO2: 15 mmol/L — ABNORMAL LOW (ref 22–32)
Calcium: 9.3 mg/dL (ref 8.9–10.3)
Chloride: 116 mmol/L — ABNORMAL HIGH (ref 98–111)
Creatinine, Ser: 1.46 mg/dL — ABNORMAL HIGH (ref 0.44–1.00)
GFR, Estimated: 48 mL/min — ABNORMAL LOW (ref >60)
Glucose, Bld: 144 mg/dL — ABNORMAL HIGH (ref 70–99)
Potassium: 4.1 mmol/L (ref 3.5–5.1)
Sodium: 143 mmol/L (ref 135–145)
Total Bilirubin: 0.7 mg/dL (ref <1.2)
Total Protein: 8 g/dL (ref 6.5–8.1)

## 2023-07-08 LAB — BASIC METABOLIC PANEL
Anion gap: 11 (ref 5–15)
Anion gap: 20 — ABNORMAL HIGH (ref 5–15)
Anion gap: 14 (ref 5–15)
BUN: 38 mg/dL — ABNORMAL HIGH (ref 6–20)
BUN: 35 mg/dL — ABNORMAL HIGH (ref 6–20)
BUN: 29 mg/dL — ABNORMAL HIGH (ref 6–20)
CO2: 29 mmol/L (ref 22–32)
CO2: 16 mmol/L — ABNORMAL LOW (ref 22–32)
CO2: 20 mmol/L — ABNORMAL LOW (ref 22–32)
Calcium: 8.9 mg/dL (ref 8.9–10.3)
Calcium: 9.3 mg/dL (ref 8.9–10.3)
Calcium: 8.7 mg/dL — ABNORMAL LOW (ref 8.9–10.3)
Chloride: 100 mmol/L (ref 98–111)
Chloride: 113 mmol/L — ABNORMAL HIGH (ref 98–111)
Chloride: 111 mmol/L (ref 98–111)
Creatinine, Ser: 1.85 mg/dL — ABNORMAL HIGH (ref 0.44–1.00)
Creatinine, Ser: 1.45 mg/dL — ABNORMAL HIGH (ref 0.44–1.00)
Creatinine, Ser: 1.41 mg/dL — ABNORMAL HIGH (ref 0.44–1.00)
GFR, Estimated: 36 mL/min — ABNORMAL LOW (ref >60)
GFR, Estimated: 48 mL/min — ABNORMAL LOW (ref >60)
GFR, Estimated: 50 mL/min — ABNORMAL LOW (ref >60)
Glucose, Bld: 129 mg/dL — ABNORMAL HIGH (ref 70–99)
Glucose, Bld: 133 mg/dL — ABNORMAL HIGH (ref 70–99)
Glucose, Bld: 140 mg/dL — ABNORMAL HIGH (ref 70–99)
Potassium: 3.5 mmol/L (ref 3.5–5.1)
Potassium: 4.2 mmol/L (ref 3.5–5.1)
Potassium: 4.3 mmol/L (ref 3.5–5.1)
Sodium: 140 mmol/L (ref 135–145)
Sodium: 149 mmol/L — ABNORMAL HIGH (ref 135–145)
Sodium: 145 mmol/L (ref 135–145)

## 2023-07-08 LAB — LACTIC ACID, PLASMA
Lactic Acid, Venous: 3.5 mmol/L (ref 0.5–1.9)
Lactic Acid, Venous: 2.4 mmol/L (ref 0.5–1.9)

## 2023-07-08 LAB — BLOOD GAS, VENOUS
Acid-base deficit: 4.8 mmol/L — ABNORMAL HIGH (ref 0.0–2.0)
Bicarbonate: 19.7 mmol/L — ABNORMAL LOW (ref 20.0–28.0)
Drawn by: 66579
O2 Saturation: 39.9 %
Patient temperature: 36.8
pCO2, Ven: 34 mm[Hg] — ABNORMAL LOW (ref 44–60)
pH, Ven: 7.37 (ref 7.25–7.43)
pO2, Ven: <31 mm[Hg] — CL (ref 32–45)

## 2023-07-08 LAB — PREGNANCY, URINE: Preg Test, Ur: NEGATIVE

## 2023-07-08 LAB — CK: Total CK: 18 U/L — ABNORMAL LOW (ref 38–234)

## 2023-07-08 LAB — MAGNESIUM
Magnesium: 1.9 mg/dL (ref 1.7–2.4)
Magnesium: 2.6 mg/dL — ABNORMAL HIGH (ref 1.7–2.4)
Magnesium: 2.5 mg/dL — ABNORMAL HIGH (ref 1.7–2.4)

## 2023-07-08 MED ORDER — LORAZEPAM 2 MG/ML IJ SOLN
1.0000 mg | INTRAMUSCULAR | Status: DC | PRN
  Administered 2023-07-08: 1 mg via INTRAVENOUS
  Filled 2023-07-08: qty 1

## 2023-07-08 MED ORDER — MAGNESIUM SULFATE 2 GM/50ML IV SOLN
2.0000 g | Freq: Once | INTRAVENOUS | Status: AC
  Administered 2023-07-08: 2 g via INTRAVENOUS
  Filled 2023-07-08: qty 50

## 2023-07-08 MED ORDER — POTASSIUM CHLORIDE CRYS ER 20 MEQ PO TBCR
20.0000 meq | EXTENDED_RELEASE_TABLET | Freq: Once | ORAL | Status: AC
  Administered 2023-07-08: 20 meq via ORAL
  Filled 2023-07-08: qty 1

## 2023-07-08 MED ORDER — CHLORHEXIDINE GLUCONATE CLOTH 2 % EX PADS
6.0000 | MEDICATED_PAD | Freq: Every day | CUTANEOUS | Status: DC
  Administered 2023-07-08 – 2023-07-09 (×2): 6 via TOPICAL

## 2023-07-08 MED ORDER — POTASSIUM CHLORIDE CRYS ER 20 MEQ PO TBCR
40.0000 meq | EXTENDED_RELEASE_TABLET | Freq: Once | ORAL | Status: AC
  Administered 2023-07-08: 40 meq via ORAL
  Filled 2023-07-08: qty 2

## 2023-07-08 MED ORDER — LORAZEPAM 2 MG/ML IJ SOLN: 1.0000 mg | INTRAMUSCULAR | Status: DC | PRN

## 2023-07-08 MED ORDER — LORAZEPAM 2 MG/ML IJ SOLN
1.0000 mg | Freq: Once | INTRAMUSCULAR | Status: AC
  Administered 2023-07-08: 1 mg via INTRAVENOUS
  Filled 2023-07-08: qty 1

## 2023-07-08 NOTE — Plan of Care (Signed)
  Problem: Education: Goal: Knowledge of General Education information will improve Description: Including pain rating scale, medication(s)/side effects and non-pharmacologic comfort measures Outcome: Not Progressing   Problem: Health Behavior/Discharge Planning: Goal: Ability to manage health-related needs will improve Outcome: Not Progressing   Problem: Clinical Measurements: Goal: Ability to maintain clinical measurements within normal limits will improve Outcome: Progressing Goal: Will remain free from infection Outcome: Progressing Goal: Diagnostic test results will improve Outcome: Progressing Goal: Respiratory complications will improve Outcome: Progressing Goal: Cardiovascular complication will be avoided Outcome: Progressing   Problem: Activity: Goal: Risk for activity intolerance will decrease Outcome: Progressing   Problem: Nutrition: Goal: Adequate nutrition will be maintained Outcome: Progressing   Problem: Coping: Goal: Level of anxiety will decrease Outcome: Progressing   Problem: Elimination: Goal: Will not experience complications related to bowel motility Outcome: Progressing Goal: Will not experience complications related to urinary retention Outcome: Progressing   Problem: Pain Management: Goal: General experience of comfort will improve Outcome: Progressing   Problem: Safety: Goal: Ability to remain free from injury will improve Outcome: Progressing   Problem: Skin Integrity: Goal: Risk for impaired skin integrity will decrease Outcome: Progressing

## 2023-07-08 NOTE — Plan of Care (Signed)
  Problem: Health Behavior/Discharge Planning: Goal: Ability to manage health-related needs will improve Outcome: Progressing   Problem: Clinical Measurements: Goal: Ability to maintain clinical measurements within normal limits will improve Outcome: Progressing Goal: Will remain free from infection Outcome: Progressing Goal: Diagnostic test results will improve Outcome: Progressing Goal: Respiratory complications will improve Outcome: Progressing Goal: Cardiovascular complication will be avoided Outcome: Progressing   Problem: Activity: Goal: Risk for activity intolerance will decrease Outcome: Progressing   Problem: Coping: Goal: Level of anxiety will decrease Outcome: Progressing   Problem: Elimination: Goal: Will not experience complications related to bowel motility Outcome: Progressing Goal: Will not experience complications related to urinary retention Outcome: Progressing   Problem: Pain Management: Goal: General experience of comfort will improve Outcome: Progressing   Problem: Safety: Goal: Ability to remain free from injury will improve Outcome: Progressing   Problem: Skin Integrity: Goal: Risk for impaired skin integrity will decrease Outcome: Progressing

## 2023-07-08 NOTE — Assessment & Plan Note (Addendum)
Vital sign instability as above, will aggressively manage symptoms as the medication leave the patient's system.  Unclear this a.m. if this was an SI attempt as patient knowingly took 7 tablets when she was prescribed 1 tablet.  -MIVF -Continue Foley in the setting of acute urinary retention -Aggressive cooling if febrile -BMP and mag every 6 hour with aggressive electrolyte repletion -EKG every 6 hour to monitor for QT prolongation -Vital signs and monitoring of pupils, orientation, muscle tone and reflexes q2h -Lorazepam 1 mg now and every 2 hour as needed with vital signs parameters -If concern of muscle rigidity or clonus, will urgently contact poison control toxicologist for antidote given concern for NMS -In the event of a seizure, utilize lorazepam first-line, phenobarbital second line for seizure abortive (but would likely need CCM management) -Will continue to manage with poison control recommendations -Likely needs psych eval when medically stable

## 2023-07-08 NOTE — Plan of Care (Signed)
FMTS Brief Progress Note  S:Patient resting comfortably in bed, fiance at bedside. She answers all questions appropriately, denies any acute concerns at present.    O: BP (!) 149/100 (BP Location: Left Arm)   Pulse (!) 141   Temp 98.7 F (37.1 C) (Oral)   Resp (!) 51   Ht 5\' 9"  (1.753 m)   Wt 72.6 kg   SpO2 100%   BMI 23.63 kg/m   Gen: comfortable, NAD HENT: MMM, EOMs intact Cardio: Tachycardic, regular, without murmur Pulm: Comfortably tachpneic, only speaking in 3-4 word senteces, but this does not seem related to any conversational dyspnea, perhaps more related to her flattened affect Psych: Flat affect, denies SI   A/P: Olanzapine Overdose Remains markedly tachycardic, tachypneic with intermittent hyperpyrexia. HR is in the high 120s, low 130s on the monitor. She remains tachypneic but is quite comfortable. Mental status seems improved compared to prior notes. No evidence of clonus, hyperreflexia, or muscular rigidity on exam.  - Continuing q6h BMP and Mag per poison control recommendations - Continue to monitor QTc on serial EKGs - Ativan for tachycardia, tachypnea, or hyperpyrexia   - Orders reviewed. Labs for AM ordered, which was adjusted as needed.  - If condition changes, plan includes re-consultation with toxicology/poison control. Dantrolene+Bromocriptine if develops signs/symptoms of NMS.  - Likely will need psychiatry input prior to discharge once medically stable   Alicia Amel, MD 07/08/2023, 7:41 PM PGY-3, [facility] Night Resident  Please page [phone] with questions.

## 2023-07-08 NOTE — Progress Notes (Signed)
Pt fatigued and weak. No muscle rigidity or clonus noted upon assessment.

## 2023-07-08 NOTE — Assessment & Plan Note (Addendum)
Improved from admission but elevated above baseline. -Trend renal function -Continue IV hydration as above

## 2023-07-08 NOTE — Plan of Care (Addendum)
FMTS Brief Progress Note  Nurse informed night team that patient had one episode of dark brown/black emesis and stool around 1250AM. In to see patient with Dr. Royal Piedra. Patient intermittently awake and able to respond to commands. Denies nausea at this time. Does endorse some generalized abdominal pain with TTP in all four quadrants. Discussed with nurse- Imagene Sheller, who reports vomit was dark but did not appear coffee-ground in appearance and did not have any frank blood. Will order Abdominal Xray and consider CT scan if patient continues to experience emesis.

## 2023-07-08 NOTE — Progress Notes (Signed)
Handoff report received from day shift RN Eugenio Hoes.

## 2023-07-08 NOTE — Assessment & Plan Note (Addendum)
Trending down with MIVF.  Likely in the setting of overdose as stated above.

## 2023-07-08 NOTE — Assessment & Plan Note (Addendum)
Holding home meds in the setting of acute overdose.  Likely needs continued outpatient psychiatric support for medication management.

## 2023-07-08 NOTE — Plan of Care (Signed)
Note most recent ECG with inferolateral TWI. She has been persistently tachycardic for the past 36 hours, with rates reaching into the 160s when she first arrived. Sinus tachycardia 2/2 olanzapine toxicity. Her HR is slowly improving, now in the 110s-120s.  She denies any chest pain or shortness of breath and is feeling generally well. No new symptoms to suggest heart failure such as dyspnea, edema, or wheeze.  These could be sympathetic T waves in the setting of heightened sympathetic tone 2/2 Olanzapine toxicity. She also has documented LVH on Echo from 2019 and is therefore prone to a lateral strain pattern. She certainly is at risk for developing a tachycardia induced cardiomyopathy and associated repolarization abnormality given the duration of her symptoms. Less concerned for any acute coronary ischemia at this time, but we will continue to monitor serial EKGs for further changes that could suggest ischemia.  - Ongoing Serial EKG monitoring - Monitor closely for signs of incipient heart failure - STAT EKG and Trop if she develops CP or dyspnea  J Dorothyann Gibbs, MD

## 2023-07-08 NOTE — Progress Notes (Addendum)
Daily Progress Note Intern Pager: [phone]  Patient name: Jessica Walton Medical record number: [id number] Date of birth: Dec 03, 1985 Age: 37 y.o. Gender: female  Primary Care Provider: Pcp, No Consultants: None Code Status: Full code  Pt Overview and Major Events to Date:  12/13: Admitted to FMTS  Assessment and Plan: KORY DIMARZIO is a 37 y.o. female with PMHx schizophrenia and substance use disorder presenting with altered mental status secondary to suspected overdose of olanzapine-samidorphan.   Overnight patient persistently tachycardic, tachypneic and febrile likely secondary to anticholinergic effects of olanzapine.  No signs of muscle rigidity or clonus upon exam suggestive of NMS.  Protecting airway upon exam, less concerned about respiratory decompensation.  Contacted poison control this AM and they recommended aggressive cooling with close temperature monitoring.  Optimization of potassium (4.2-4.5) and magnesium (above 2) and frequent EKGs to assess for QT prolongation.  Given tachycardia, tachypnea and fever they recommend optimizing benzodiazepines therefore plan for lorazepam 1 mg now and every 2 hours as needed for vital sign management. Planning for every 2 hour vitals and check for muscle rigidity/clonus. Symptomatically likely to wax and wane over the next several days and patient is still at risk of progression to NMS.  Assessment & Plan Overdose of antipsychotic Vital sign instability as above, will aggressively manage symptoms as the medication leave the patient's system.  Unclear this a.m. if this was an SI attempt as patient knowingly took 7 tablets when she was prescribed 1 tablet.  -MIVF -Continue Foley in the setting of acute urinary retention -Aggressive cooling if febrile -BMP and mag every 6 hour with aggressive electrolyte repletion -EKG every 6 hour to monitor for QT prolongation -Vital signs and monitoring of pupils, orientation, muscle tone and  reflexes q2h -Lorazepam 1 mg now and every 2 hour as needed with vital signs parameters -If concern of muscle rigidity or clonus, will urgently contact poison control toxicologist for antidote given concern for NMS -In the event of a seizure, utilize lorazepam first-line, phenobarbital second line for seizure abortive (but would likely need CCM management) -Will continue to manage with poison control recommendations -Likely needs psych eval when medically stable AKI (acute kidney injury) (HCC) Improved from admission but elevated above baseline. -Trend renal function -Continue IV hydration as above Schizophrenia (HCC) Holding home meds in the setting of acute overdose.  Likely needs continued outpatient psychiatric support for medication management. Lactic acidosis Trending down with MIVF.  Likely in the setting of overdose as stated above.  Chronic and Stable Problems: PTSD: Holding home prazosin   FEN/GI: NPO PPx: Lovenox Dispo: Pending clinical improvement  Subjective:  Nursing staff cleaning up patient after BM.  Patient alert and appropriately responding to questions.  Reports she took 7 pills of olanzapine-samidorphan knowing she was prescribed 1 pill.  Reports headache, stomach upset and feeling like her heart is racing.  Objective: Temp:  [96.6 F (35.9 C)-100.7 F (38.2 C)] 100.7 F (38.2 C) (12/14 0807) Pulse Rate:  [61-158] 158 (12/14 0310) Resp:  [34-62] 40 (12/14 0310) BP: (131-162)/(21-123) 134/109 (12/14 0807) SpO2:  [98 %-100 %] 100 % (12/14 0807) Weight:  [72.6 kg] 72.6 kg (12/13 1050) Physical Exam: General: Lying in bed with her eyes open, limited movements.  No acute distress Cardiovascular: Tachycardia.  Regular rhythm.  No murmur Respiratory: Tachypneic with some shallow breathing.  Normal oxygen saturation on room air Abdomen: Soft, nondistended.  Mildly tender to palpation diffusely Extremities: Full range of motion of extremities without  muscle  rigidity noted.  No peripheral edema. Neuro: Pupils mildly dilated bilaterally but reactive.  Cranial nerves intact.  5/5 grip strength and hip flexion bilaterally.  Normal reflexia of bicep, patellar and Achilles reflexes bilaterally. Psych: Flat mood and affect.  Denies SI  Laboratory: Most recent CBC Lab Results  Component Value Date   WBC 21.1 (H) 07/08/2023   HGB 17.7 (H) 07/08/2023   HCT 51.9 (H) 07/08/2023   MCV 82.3 07/08/2023   PLT 488 (H) 07/08/2023   Most recent BMP    Latest Ref Rng & Units 07/08/2023    2:54 AM  BMP  Glucose 70 - 99 mg/dL 409   BUN 6 - 20 mg/dL 39   Creatinine 8.11 - 1.00 mg/dL 9.14   Sodium 782 - 956 mmol/L 143   Potassium 3.5 - 5.1 mmol/L 4.1   Chloride 98 - 111 mmol/L 116   CO2 22 - 32 mmol/L 15   Calcium 8.9 - 10.3 mg/dL 9.3     Other pertinent labs: CK: 575 > 18 Lactate: 3.5 > 2.4  Imaging/Diagnostic Tests: EKG: Tachycardia.  QTc 497  DG Abd 1 View Result Date: 07/08/2023 IMPRESSION: Negative.  CT HEAD WO CONTRAST Result Date: 07/07/2023 IMPRESSION: No acute intracranial abnormality.  DG Chest Port 1 View Result Date: 07/07/2023 IMPRESSION: No active disease.     Elberta Fortis, MD 07/08/2023, 8:41 AM  PGY-2, [facility] FPTS Intern pager: [phone], text pages welcome Secure chat group [facility] Teaching Service

## 2023-07-09 ENCOUNTER — Other Ambulatory Visit: Payer: Self-pay

## 2023-07-09 DIAGNOSIS — N179 Acute kidney failure, unspecified: Secondary | ICD-10-CM | POA: Diagnosis not present

## 2023-07-09 DIAGNOSIS — R9431 Abnormal electrocardiogram [ECG] [EKG]: Secondary | ICD-10-CM | POA: Insufficient documentation

## 2023-07-09 DIAGNOSIS — R12 Heartburn: Secondary | ICD-10-CM | POA: Insufficient documentation

## 2023-07-09 DIAGNOSIS — T43504A Poisoning by unspecified antipsychotics and neuroleptics, undetermined, initial encounter: Secondary | ICD-10-CM | POA: Diagnosis not present

## 2023-07-09 LAB — BASIC METABOLIC PANEL
Anion gap: 12 (ref 5–15)
Anion gap: 10 (ref 5–15)
Anion gap: 10 (ref 5–15)
Anion gap: 9 (ref 5–15)
BUN: 24 mg/dL — ABNORMAL HIGH (ref 6–20)
BUN: 21 mg/dL — ABNORMAL HIGH (ref 6–20)
BUN: 21 mg/dL — ABNORMAL HIGH (ref 6–20)
BUN: 18 mg/dL (ref 6–20)
CO2: 20 mmol/L — ABNORMAL LOW (ref 22–32)
CO2: 22 mmol/L (ref 22–32)
CO2: 20 mmol/L — ABNORMAL LOW (ref 22–32)
CO2: 21 mmol/L — ABNORMAL LOW (ref 22–32)
Calcium: 8.7 mg/dL — ABNORMAL LOW (ref 8.9–10.3)
Calcium: 8.7 mg/dL — ABNORMAL LOW (ref 8.9–10.3)
Calcium: 8.8 mg/dL — ABNORMAL LOW (ref 8.9–10.3)
Calcium: 8.7 mg/dL — ABNORMAL LOW (ref 8.9–10.3)
Chloride: 110 mmol/L (ref 98–111)
Chloride: 107 mmol/L (ref 98–111)
Chloride: 107 mmol/L (ref 98–111)
Chloride: 107 mmol/L (ref 98–111)
Creatinine, Ser: 1.33 mg/dL — ABNORMAL HIGH (ref 0.44–1.00)
Creatinine, Ser: 1.19 mg/dL — ABNORMAL HIGH (ref 0.44–1.00)
Creatinine, Ser: 1.24 mg/dL — ABNORMAL HIGH (ref 0.44–1.00)
Creatinine, Ser: 0.98 mg/dL (ref 0.44–1.00)
GFR, Estimated: 53 mL/min — ABNORMAL LOW (ref >60)
GFR, Estimated: >60 mL/min (ref >60)
GFR, Estimated: 58 mL/min — ABNORMAL LOW (ref >60)
GFR, Estimated: >60 mL/min (ref >60)
Glucose, Bld: 130 mg/dL — ABNORMAL HIGH (ref 70–99)
Glucose, Bld: 128 mg/dL — ABNORMAL HIGH (ref 70–99)
Glucose, Bld: 132 mg/dL — ABNORMAL HIGH (ref 70–99)
Glucose, Bld: 129 mg/dL — ABNORMAL HIGH (ref 70–99)
Potassium: 4.6 mmol/L (ref 3.5–5.1)
Potassium: 3.7 mmol/L (ref 3.5–5.1)
Potassium: 4.1 mmol/L (ref 3.5–5.1)
Potassium: 3.9 mmol/L (ref 3.5–5.1)
Sodium: 142 mmol/L (ref 135–145)
Sodium: 139 mmol/L (ref 135–145)
Sodium: 137 mmol/L (ref 135–145)
Sodium: 137 mmol/L (ref 135–145)

## 2023-07-09 LAB — MAGNESIUM
Magnesium: 2.2 mg/dL (ref 1.7–2.4)
Magnesium: 2 mg/dL (ref 1.7–2.4)
Magnesium: 1.8 mg/dL (ref 1.7–2.4)
Magnesium: 1.8 mg/dL (ref 1.7–2.4)

## 2023-07-09 LAB — CBC
HCT: 42.3 % (ref 36.0–46.0)
Hemoglobin: 14.2 g/dL (ref 12.0–15.0)
MCH: 28.8 pg (ref 26.0–34.0)
MCHC: 33.6 g/dL (ref 30.0–36.0)
MCV: 85.8 fL (ref 80.0–100.0)
Platelets: 367 10*3/uL (ref 150–400)
RBC: 4.93 MIL/uL (ref 3.87–5.11)
RDW: 16.8 % — ABNORMAL HIGH (ref 11.5–15.5)
WBC: 22.4 10*3/uL — ABNORMAL HIGH (ref 4.0–10.5)
nRBC: 0 % (ref 0.0–0.2)

## 2023-07-09 LAB — TROPONIN I (HIGH SENSITIVITY)
Troponin I (High Sensitivity): 46 ng/L — ABNORMAL HIGH (ref <18)
Troponin I (High Sensitivity): 44 ng/L — ABNORMAL HIGH (ref <18)

## 2023-07-09 MED ORDER — POTASSIUM CHLORIDE CRYS ER 20 MEQ PO TBCR
40.0000 meq | EXTENDED_RELEASE_TABLET | Freq: Once | ORAL | Status: AC
  Administered 2023-07-09: 40 meq via ORAL
  Filled 2023-07-09: qty 2

## 2023-07-09 MED ORDER — CALCIUM CARBONATE ANTACID 500 MG PO CHEW
1.0000 | CHEWABLE_TABLET | Freq: Three times a day (TID) | ORAL | Status: DC | PRN
  Administered 2023-07-09: 200 mg via ORAL
  Filled 2023-07-09: qty 1

## 2023-07-09 MED ORDER — FAMOTIDINE 20 MG PO TABS
10.0000 mg | ORAL_TABLET | Freq: Every day | ORAL | Status: DC
  Administered 2023-07-09 – 2023-07-10 (×2): 10 mg via ORAL
  Filled 2023-07-09 (×2): qty 1

## 2023-07-09 MED ORDER — PANTOPRAZOLE SODIUM 40 MG PO TBEC
40.0000 mg | DELAYED_RELEASE_TABLET | Freq: Every day | ORAL | Status: DC
  Administered 2023-07-09: 40 mg via ORAL
  Filled 2023-07-09: qty 1

## 2023-07-09 MED ORDER — FAMOTIDINE 20 MG PO TABS: 10.0000 mg | ORAL_TABLET | Freq: Every day | ORAL | Status: DC

## 2023-07-09 MED ORDER — MAGNESIUM SULFATE 2 GM/50ML IV SOLN
2.0000 g | Freq: Once | INTRAVENOUS | Status: AC
  Administered 2023-07-09: 2 g via INTRAVENOUS
  Filled 2023-07-09: qty 50

## 2023-07-09 MED ORDER — ENOXAPARIN SODIUM 40 MG/0.4ML IJ SOSY
40.0000 mg | PREFILLED_SYRINGE | INTRAMUSCULAR | Status: DC
  Administered 2023-07-09: 40 mg via SUBCUTANEOUS
  Filled 2023-07-09: qty 0.4

## 2023-07-09 MED ORDER — ACETAMINOPHEN 325 MG PO TABS
650.0000 mg | ORAL_TABLET | Freq: Once | ORAL | Status: AC | PRN
  Administered 2023-07-09: 650 mg via ORAL
  Filled 2023-07-09: qty 2

## 2023-07-09 NOTE — Plan of Care (Signed)
FMTS Brief Progress Note  S:Received secure chat from RN about patient complaining of bladder fullness and requesting foley be removed. Went bedside w/ Dr. Hyacinth Meeker to see patient. Patient laying in bed, complaining of discomfort (burning sensation), w/ RN attempting to bladder scan.    O: BP 129/87   Pulse (!) 108   Temp 97.9 F (36.6 C) (Oral)   Resp (!) 26   Ht 5\' 9"  (1.753 m)   Wt 72.6 kg   SpO2 97%   BMI 23.63 kg/m   General: NAD, conversant, alert Resp: Good WOB  A/P: Bladder Discomfort Patient complaining of bladder discomfort, with Foley in.  Patient requesting Foley be removed.  Given patient's history of drug overdose, and half-life of drug, there is increased risk for continued urinary retention.  This provider would like to continue Foley catheter.  Will flush Foley catheter for now, as Foley is not actively draining urine.  RN in room with bladder scan of around 320.  Suspect Foley may not be draining.  Will have RN flush Foley, and consider changing not improved. I do have some concern for infection given patient complaining of burning sensation.  - Flush Foley catheter - Bladder scan 30 minutes after flush - Consider change Foley, if not draining - Will consider discontinuing Foley, for risk of infection, if symptoms continue   Bess Kinds, MD 07/09/2023, 2:49 PM PGY-3, [facility] Night Resident  Please page [phone] with questions.

## 2023-07-09 NOTE — Assessment & Plan Note (Signed)
Continues to improve. -Trend renal function -Continue IV hydration as above

## 2023-07-09 NOTE — Assessment & Plan Note (Signed)
New complaint this morning, though she says she has had symptoms since arriving here. It is certainly possible she has some sort of pill gastritis at play. Will try to treat symptomatically for now, do not feel GI workup is indicated just yet. - Protonix 40mg  daily - PRN TUMS

## 2023-07-09 NOTE — Progress Notes (Signed)
Daily Progress Note Intern Pager: [phone]  Patient name: Jessica Walton Medical record number: [id number] Date of birth: 26-Jul-1985 Age: 37 y.o. Gender: female  Primary Care Provider: Pcp, No Consultants: Toxicology/Poison Control Code Status: Full  Pt Overview and Major Events to Date:  12/13- Admitted to FMTS  Assessment and Plan: Ms. Elynor Chumney is a 37yo female admitted for an overdose of olanzapine-samidorphan. She has a PMH of schizophrenia and substance use disorder. She remains tachycardic and tachypneic 2/2 olanzapine toxicity but seems to be slowly improving as expected. Once she is more stabilized, will need to involve psychiatry in her care.   Assessment & Plan Overdose of antipsychotic Vitals are slowly improving. HR has come down to the high teens, respiratory rate to the mid 30s. Tmax overnight was 99.9. Last rever around 1300 yesterday. Intent of OD remains unclear. Did not require any ativan overnight. Mental status seems much improved this morning. No longer needs to be NPO.  -Continue Foley in the setting of acute urinary retention. Will plan for void trial tomorrow.  -Aggressive cooling if febrile -BMP and mag every 6 hour with aggressive electrolyte repletion -EKG every 6 hour to monitor for QT prolongation -Vital signs and monitoring of pupils, orientation, muscle tone and reflexes q2h -Lorazepam 1 mg every 2 hour as needed with vital signs parameters -If concern of muscle rigidity or clonus, will need dantrolene/bromocriptine urgently for NMS -In the event of a seizure, utilize lorazepam first-line, phenobarbital second line for seizure abortive (but would likely need CCM management) -Will continue to manage with poison control recommendations -Consult to psych tomorrow  T wave inversion in EKG First noted overnight. Hoping this is transient and related to increased sympathetic tone and her persistent tachycardia. She may well be developing a  tachycardia-induced cardiomyopathy that is likely to be reversible. No evidence of heart failure on exam.  - Continue serial EKG - If this does not resolve, could obtain echo  - STAT EKG and troponin if develops chest pain  AKI (acute kidney injury) (HCC) Continues to improve. -Trend renal function -Continue IV hydration as above Schizophrenia (HCC) Holding home meds in the setting of acute overdose. Discussed with fiance that he will likely need to help with medication organization and administration at home in the wake of this overdose.  - Consult to psychiatry tomorrow as she will likely be close to medically stable  Heartburn New complaint this morning, though she says she has had symptoms since arriving here. It is certainly possible she has some sort of pill gastritis at play. Will try to treat symptomatically for now, do not feel GI workup is indicated just yet. - Protonix 40mg  daily - PRN TUMS     FEN/GI: Regular diet (had been NPO but mental status now improved) PPx: Lovenox Dispo: Pending clinical improvement and psych recommendations, but likely home with outpatient psych follow-up     Subjective:  Tells me she's feeling generally better this morning, but having some heartburn and epigastric pain. Says this has been going on since she got here. Denies any CP/SOB. Eager to eat and drink. Very thirsty.   Objective: Temp:  [98.7 F (37.1 C)-101.1 F (38.4 C)] 99.9 F (37.7 C) (12/15 0300) Pulse Rate:  [48-141] 123 (12/15 0300) Resp:  [18-55] 30 (12/15 0300) BP: (117-151)/(80-109) 121/80 (12/15 0300) SpO2:  [83 %-100 %] 97 % (12/15 0300) Physical Exam: General: Well-appearing. NAD and conversant.Somewhat flat affect Cardiovascular: Tachycardic, regular, no murmur Respiratory: Comfortably tachypneic on  room air, lungs clear to auscultation throughout Abdomen: Soft, mild TTP epigastrically Extremities: Without edema or deformity. No muscular rigidity or clonus.    Laboratory: Most recent CBC Lab Results  Component Value Date   WBC 22.4 (H) 07/09/2023   HGB 14.2 07/09/2023   HCT 42.3 07/09/2023   MCV 85.8 07/09/2023   PLT 367 07/09/2023   Most recent BMP    Latest Ref Rng & Units 07/09/2023    4:11 AM  BMP  Glucose 70 - 99 mg/dL 119   BUN 6 - 20 mg/dL 24   Creatinine 1.47 - 1.00 mg/dL 8.29   Sodium 562 - 130 mmol/L 142   Potassium 3.5 - 5.1 mmol/L 4.6   Chloride 98 - 111 mmol/L 110   CO2 22 - 32 mmol/L 20   Calcium 8.9 - 10.3 mg/dL 8.7      Imaging/Diagnostic Tests: No new imaging, tests  Alicia Amel, MD 07/09/2023, 5:31 AM  PGY-3, [facility] FPTS Intern pager: [phone], text pages welcome Secure chat group [facility] Teaching Service

## 2023-07-09 NOTE — Plan of Care (Signed)
FMTS Brief Progress Note  S: Rounded on patient with Dr. Sharion Dove.  Patient is that she has been experiencing some chest pain which feels like her reflux.  There are some tenderness with palpation and the pain is relieved with eating ice cream.  She further clarifies her history prior to presentation.  She was recently started on olanzapine samidorphan and on Friday took 3 pills which is more than her prescribed amount.  O: BP (!) 128/91 (BP Location: Left Arm)   Pulse (!) 108   Temp 98.8 F (37.1 C) (Oral)   Resp 20   Ht 5\' 9"  (1.753 m)   Wt 72.6 kg   SpO2 97%   BMI 23.63 kg/m   General: Awake and alert, NAD CV: RRR, no murmurs auscultated Pulm: normal WOB with normal respiratory rate Abdomen: Soft, nontender throughout  A/P: Olanzapine overdose Tachycardia and tachypnea have resolved.  She has not been febrile since 2 PM on 12/14.  Touched base with poison control toxicologist regarding previously noted T wave inversions.  These appear to have improved although not entirely resolved.  This can be an expected consequence of olanzapine especially in combination with samidorphan.  Recommended to continue serial EKGs. -Every 6 hours EKGs, BMP, magnesium -Prior electrolytes of potassium 3.9 and magnesium 1.8; potassium 40 mEq, magnesium 2 g -Continue plan otherwise per day team (lorazepam, psych consult) - Orders reviewed. Labs for AM ordered, which was adjusted as needed.   Heartburn Provided Protonix 40 mg this a.m.  Given serial EKG monitoring with prior QTc prolongation, will adjust this regimen.  Discussed QT side effect profile with pharmacy.  H2 blocker would potentially be a better option. -Discontinue Protonix, start famotidine 10 mg -Continue as needed Tums  Shelby Mattocks, DO 07/09/2023, 10:35 PM PGY-3, [facility] Night Resident  Please page [phone] with questions.

## 2023-07-09 NOTE — Assessment & Plan Note (Signed)
First noted overnight. Hoping this is transient and related to increased sympathetic tone and her persistent tachycardia. She may well be developing a tachycardia-induced cardiomyopathy that is likely to be reversible. No evidence of heart failure on exam.  - Continue serial EKG - If this does not resolve, could obtain echo  - STAT EKG and troponin if develops chest pain

## 2023-07-09 NOTE — Assessment & Plan Note (Addendum)
Vitals are slowly improving. HR has come down to the high teens, respiratory rate to the mid 30s. Tmax overnight was 99.9. Last rever around 1300 yesterday. Intent of OD remains unclear. Did not require any ativan overnight. Mental status seems much improved this morning. No longer needs to be NPO.  -Continue Foley in the setting of acute urinary retention. Will plan for void trial tomorrow.  -Aggressive cooling if febrile -BMP and mag every 6 hour with aggressive electrolyte repletion -EKG every 6 hour to monitor for QT prolongation -Vital signs and monitoring of pupils, orientation, muscle tone and reflexes q2h -Lorazepam 1 mg every 2 hour as needed with vital signs parameters -If concern of muscle rigidity or clonus, will need dantrolene/bromocriptine urgently for NMS -In the event of a seizure, utilize lorazepam first-line, phenobarbital second line for seizure abortive (but would likely need CCM management) -Will continue to manage with poison control recommendations -Consult to psych tomorrow

## 2023-07-09 NOTE — Assessment & Plan Note (Signed)
Holding home meds in the setting of acute overdose. Discussed with fiance that he will likely need to help with medication organization and administration at home in the wake of this overdose.  - Consult to psychiatry tomorrow as she will likely be close to medically stable

## 2023-07-09 NOTE — Plan of Care (Signed)

## 2023-07-09 NOTE — Hospital Course (Signed)
Jessica Walton is a 37 y.o. female who was admitted to the [facility] Medicine Teaching Service at [facility] for overdose of olanzapine. Hospital course is outlined below by problem.   Toxic Ingestion Patient was given IVF and lorazepam 1 mg x3 in the ED per poison control instructions. They also recommended frequent neuro checks and serial EKGs due to Qtc prolongation and tachycardia. Due to anticholinergic effects, patient required catheterization for urinary retention. Serial BMPs and Mag levels were drawn every 6 hours to monitor electrolytes for repletion. She required IVF for AKI secondary to her ingestion, but was transitioned to PO as her mentation improved. Vitals were monitored closely due concern for NMS. ***  Other conditions that were chronic and stable: ***  Issues for follow up: ***

## 2023-07-10 ENCOUNTER — Encounter (HOSPITAL_COMMUNITY): Payer: Self-pay | Admitting: Family Medicine

## 2023-07-10 ENCOUNTER — Other Ambulatory Visit: Payer: Self-pay

## 2023-07-10 DIAGNOSIS — T43504A Poisoning by unspecified antipsychotics and neuroleptics, undetermined, initial encounter: Secondary | ICD-10-CM | POA: Diagnosis not present

## 2023-07-10 DIAGNOSIS — F3171 Bipolar disorder, in partial remission, most recent episode hypomanic: Secondary | ICD-10-CM

## 2023-07-10 DIAGNOSIS — T402X1A Poisoning by other opioids, accidental (unintentional), initial encounter: Secondary | ICD-10-CM

## 2023-07-10 DIAGNOSIS — F4310 Post-traumatic stress disorder, unspecified: Secondary | ICD-10-CM

## 2023-07-10 DIAGNOSIS — N179 Acute kidney failure, unspecified: Secondary | ICD-10-CM | POA: Diagnosis not present

## 2023-07-10 DIAGNOSIS — F419 Anxiety disorder, unspecified: Secondary | ICD-10-CM

## 2023-07-10 LAB — BASIC METABOLIC PANEL
Anion gap: 6 (ref 5–15)
Anion gap: 10 (ref 5–15)
BUN: 13 mg/dL (ref 6–20)
BUN: 11 mg/dL (ref 6–20)
CO2: 22 mmol/L (ref 22–32)
CO2: 23 mmol/L (ref 22–32)
Calcium: 8.6 mg/dL — ABNORMAL LOW (ref 8.9–10.3)
Calcium: 8.8 mg/dL — ABNORMAL LOW (ref 8.9–10.3)
Chloride: 107 mmol/L (ref 98–111)
Chloride: 104 mmol/L (ref 98–111)
Creatinine, Ser: 1.06 mg/dL — ABNORMAL HIGH (ref 0.44–1.00)
Creatinine, Ser: 0.96 mg/dL (ref 0.44–1.00)
GFR, Estimated: >60 mL/min (ref >60)
GFR, Estimated: >60 mL/min (ref >60)
Glucose, Bld: 120 mg/dL — ABNORMAL HIGH (ref 70–99)
Glucose, Bld: 110 mg/dL — ABNORMAL HIGH (ref 70–99)
Potassium: 3.9 mmol/L (ref 3.5–5.1)
Potassium: 4 mmol/L (ref 3.5–5.1)
Sodium: 135 mmol/L (ref 135–145)
Sodium: 137 mmol/L (ref 135–145)

## 2023-07-10 LAB — MAGNESIUM
Magnesium: 2.4 mg/dL (ref 1.7–2.4)
Magnesium: 2 mg/dL (ref 1.7–2.4)

## 2023-07-10 MED ORDER — POTASSIUM CHLORIDE CRYS ER 20 MEQ PO TBCR
40.0000 meq | EXTENDED_RELEASE_TABLET | Freq: Once | ORAL | Status: AC
  Administered 2023-07-10: 40 meq via ORAL
  Filled 2023-07-10: qty 2

## 2023-07-10 NOTE — Discharge Summary (Addendum)
Family Medicine Teaching [facility] Discharge Summary  Patient name: Jessica Walton Medical record number: [id number] Date of birth: 1985-09-21 Age: 37 y.o. Gender: female Date of Admission: 07/07/2023  Date of Discharge: 07/10/23  Admitting Physician: Caro Laroche, DO  Primary Care Provider: Pcp, No Consultants: Psych, Poison Control   Indication for Hospitalization: medication overdose   Discharge Diagnoses/Problem List:  Principal Problem for Admission: Overdose of antipsychotic  Other Problems addressed during stay:  Principal Problem:   Overdose of antipsychotic Active Problems:   AKI (acute kidney injury) (HCC)   Schizophrenia (HCC)   Lactic acidosis   Tachycardia   Antipsychotic overdose   T wave inversion in EKG   Heartburn   Brief Hospital Course:  Jessica Walton is a 37 y.o. female who was admitted to the [facility] Medicine Teaching Service at [facility] for overdose of olanzapine. Hospital course is outlined below by problem.   Toxic Ingestion - Olanzapine-Samidorphan Presented with AMS, tachycardia and tachypnea after reportedly taking 7 tabs Olanzapine-Samidorphan. Poison control was contacted and recommend benzodiazapine for vital sign instability, received Lorazepam x3 in ED. Foley placed due to urinary retention. Serial BMPs and Mag q6h to monitor electrolytes for repletion. She required IVF for AKI secondary to her ingestion, but was transitioned to PO as her mentation improved. Vitals were monitored closely due concern for NMS.  Over the course of admission, tachycardia and tachypnea slowly improved.  Psychiatry was consulted who recommended a safety plan with close follow up outpatient.   Patient had one episode of dark emesis but was not coffee ground, Abdominal xray negative for obstruction and hemoglobin stable. No other episodes during discharge.   Team was planning to discharge patient but patient left AMA prior to discharge due to family member in  ED after cardiac arrest.   Other conditions that were chronic and stable:  PTSD: continued home prazosin  Issues for follow up: Patient has history of intentional drug overdose, consider adjusting medications with this in mind Patient c/o of heart burn during admission, consider GERD workup     *Patient left prior to discharge due to having family member in hospital in critical condition. Patient signed AMA forms for nursing prior to elopement.*   Disposition: Home  Discharge Condition: Stable  Discharge Exam:  Vitals:   07/10/23 1200 07/10/23 1359  BP:  139/84  Pulse:  86  Resp: 19 20  Temp:  98.8 F (37.1 C)  SpO2:  100%   Exam by Dr. Georg Ruddle from this AM: General: Laying comfortably in bed, NAD Cardiovascular: Tachycardic, no murmurs rubs or gallops Respiratory: To auscultation bilaterally, normal work of breathing on room air Abdomen: Soft nontender nondistended Extremities: No edema noted Psych: A&Ox4, flat affect    Was not able to examine patient prior to elopement   Significant Procedures: N/A  Significant Labs and Imaging:  Recent Labs  Lab 07/09/23 0411  WBC 22.4*  HGB 14.2  HCT 42.3  PLT 367   Recent Labs  Lab 07/09/23 0928 07/09/23 1503 07/09/23 2052 07/10/23 0327 07/10/23 0835  NA 139 137 137 135 137  K 3.7 4.1 3.9 3.9 4.0  CL 107 107 107 107 104  CO2 22 20* 21* 22 23  GLUCOSE 128* 132* 129* 120* 110*  BUN 21* 21* 18 13 11   CREATININE 1.19* 1.24* 0.98 1.06* 0.96  CALCIUM 8.7* 8.8* 8.7* 8.6* 8.8*  MG 2.0 1.8 1.8 2.4 2.0    Pertinent Imaging   Abd Xray:  The bowel  gas pattern is nonobstructive. No radio-opaque calculi or other significant radiographic abnormality are seen.    Results/Tests Pending at Time of Discharge: None  Discharge Medications:  Allergies as of 07/10/2023       Reactions   Benadryl [diphenhydramine Hcl] Other (See Comments)   Causes seizure activity   Vicodin [hydrocodone-acetaminophen] Rash, Other (See  Comments)   And seizures. Can tolerate Tylenol        Medication List     STOP taking these medications    benztropine 1 MG tablet Commonly known as: COGENTIN   gabapentin 100 MG capsule Commonly known as: Neurontin   Lybalvi 10-10 MG Tabs Generic drug: OLANZapine-Samidorphan   OLANZapine 5 MG tablet Commonly known as: ZYPREXA   prazosin 1 MG capsule Commonly known as: MINIPRESS       TAKE these medications    mirtazapine 30 MG tablet Commonly known as: REMERON Take 1 tablet (30 mg total) by mouth at bedtime.        Discharge Instructions: Please refer to Patient Instructions section of EMR for full details.  Patient was counseled important signs and symptoms that should prompt return to medical care, changes in medications, dietary instructions, activity restrictions, and follow up appointments.   Follow-Up Appointments:  Patient eloped before discussion of follow up appointments  Penne Lash, MD 07/10/2023, 5:37 PM PGY-1, [facility]   Upper Level Addendum:   I have reviewed the above note, making necessary revisions as appropriate.   Elberta Fortis, DO PGY-2, [facility] Res[facility]

## 2023-07-10 NOTE — Progress Notes (Signed)
Per MD, EKGs d/c'd. Poison control updated.

## 2023-07-10 NOTE — Progress Notes (Signed)
Daily Progress Note Intern Pager: [phone]  Patient name: Jessica Walton Medical record number: [id number] Date of birth: 25-May-1986 Age: 37 y.o. Gender: female  Primary Care Provider: Pcp, No Consultants: Toxicology/Poison Control  Code Status: Full  Pt Overview and Major Events to Date:  12/13- admitted  Assessment and Plan: Patient is a 37 year old female with past medical history of schizophrenia and substance abuse disorder admitted for overdose of olanzapine-Simvador fan.  Patient admitted with tachypnea tachycardia and altered mental status, she continues to improve slowly, tachypnea now resolved but patient still having some tachycardia. As patient is more alert now, will consider consulted psychology for consideration of inpatient therapy. Given anticholingeric effect of clonazapine, foley was placed- will consider void trial today. Assessment & Plan Overdose of antipsychotic Vitals continue to improve.  Tachypnea resolved but still experiencing some tachycardia to low 100s.  Patient did not fever overnight. Per conversations with poison control, patients symptoms should be resolved in next few days. - void trial today - K goal: 4-4.5, replete prn  - given stable BMP, will space out BMP to daily  - QT prolongation resolved, will d/c EKGs  - CCM x 24 hours   Schizophrenia [facility]) Will consult psychiatry today given patient is reaching medical stability. Concern for intentional overdose, though patient denies. - consult psych - holding meds in setting of overdose Heartburn New complaint this morning, though she says she has had symptoms since arriving here. It is certainly possible she has some sort of pill gastritis at play. Will try to treat symptomatically for now, do not feel GI workup is indicated just yet. - Protonix 40mg  daily - PRN TUMS   Chronic and Stable Problems:  PTSD: continue home prazosin Heartburn: continue pepcid 40mg  daily    FEN/GI: Regular diet   PPx: lovenox Dispo: home vs inpatient psych facility pending psych evaluation   Subjective:  Patient reports that she is experiencing some burning and would like her Foley removed today.  When asked about situation of overdose, she reports that she had anxiety attack and thought taking more medication than prescribed would help.  Patient reports she took 3 pills.  Patient no acute distress  Objective: Temp:  [97.9 F (36.6 C)-98.9 F (37.2 C)] 98 F (36.7 C) (12/16 0300) Pulse Rate:  [106-108] 108 (12/15 1200) Resp:  [17-30] 17 (12/16 0300) BP: (111-148)/(85-96) 148/85 (12/16 0300) SpO2:  [97 %-100 %] 97 % (12/15 1200) Physical Exam: General: Laying comfortably in bed, NAD Cardiovascular: Tachycardic, no murmurs rubs or gallops Respiratory: To auscultation bilaterally, normal work of breathing on room air Abdomen: Soft nontender nondistended Extremities: No edema noted Psych: A&Ox4, flat affect   Laboratory: Most recent CBC Lab Results  Component Value Date   WBC 22.4 (H) 07/09/2023   HGB 14.2 07/09/2023   HCT 42.3 07/09/2023   MCV 85.8 07/09/2023   PLT 367 07/09/2023   Most recent BMP    Latest Ref Rng & Units 07/10/2023    3:27 AM  BMP  Glucose 70 - 99 mg/dL 469   BUN 6 - 20 mg/dL 13   Creatinine 6.29 - 1.00 mg/dL 5.28   Sodium 413 - 244 mmol/L 135   Potassium 3.5 - 5.1 mmol/L 3.9   Chloride 98 - 111 mmol/L 107   CO2 22 - 32 mmol/L 22   Calcium 8.9 - 10.3 mg/dL 8.6      Imaging/Diagnostic Tests: No new imaging Penne Lash, MD 07/10/2023, 7:43 AM  PGY-1, [place]  Family Medicine FPTS Intern pager: [phone], text pages welcome Secure chat group [facility] Teaching Service

## 2023-07-10 NOTE — Progress Notes (Signed)
Poison control updated on pt labs and status. Pt plan reviewed with poison control. Per poison control RN, call back tonight after 6pm EKG to update.

## 2023-07-10 NOTE — Assessment & Plan Note (Deleted)
First noted overnight. Hoping this is transient and related to increased sympathetic tone and her persistent tachycardia. She may well be developing a tachycardia-induced cardiomyopathy that is likely to be reversible. No evidence of heart failure on exam.  - Continue serial EKG - If this does not resolve, could obtain echo  - STAT EKG and troponin if develops chest pain

## 2023-07-10 NOTE — Assessment & Plan Note (Addendum)
Vitals continue to improve.  Tachypnea resolved but still experiencing some tachycardia to low 100s.  Patient did not fever overnight. Per conversations with poison control, patients symptoms should be resolved in next few days. - void trial today - K goal: 4-4.5, replete prn  - given stable BMP, will space out BMP to daily  - QT prolongation resolved, will d/c EKGs  - CCM x 24 hours

## 2023-07-10 NOTE — Progress Notes (Signed)
Poison control called and updated on pt status prior to leaving/pt leaving AMA. Per poison control, they will close pt case at this time.

## 2023-07-10 NOTE — Assessment & Plan Note (Deleted)
Continues to improve. -Trend renal function -Continue IV hydration as above

## 2023-07-10 NOTE — Plan of Care (Signed)
Got a secure chat from nursing that patient is trying to leave floor as patient has family member admitted to the ED. Per patient, her fiance is in the ED and she is worried about him. Uncle in the room. Discussed with patient that family member can go check on patient and we will escort him to the ED. Patient agreeable to this. Escorted family member to ED.

## 2023-07-10 NOTE — Progress Notes (Signed)
Patients fiance, Rex, called unit and updated on pt status.

## 2023-07-10 NOTE — Progress Notes (Addendum)
Date: 07/10/2023 Patient: Jessica Walton Admitted: 07/07/2023 10:38 AM Attending Provider: Caro Laroche, DO  [name] has made the decision to leave [facility] against the advice of the primary team and her RN.  She or her authorized caregiver has been informed and understands the inherent risks, including death.  She or her authorized caregiver has decided to accept the responsibility for this decision. [name] and all necessary parties have been advised that she may return to the ED for further evaluation or treatment. Her condition at time of discharge was Good.  [name] had current vital signs as follows:  Blood pressure 139/84, pulse 86, temperature 98.8 F (37.1 C), temperature source Oral, resp. rate 20, height 5\' 9"  (1.753 m), weight 72.6 kg, SpO2 100%.   [name] has signed the Leaving Against Medical Advice form prior to leaving the 4[facility] department.  Juluis Mire 07/10/2023

## 2023-07-10 NOTE — Progress Notes (Signed)
Patient currently trying to leave unit; stating she needs to see her fiance in the hospital. Patient was seen in wheelchair with visitor trying to roll of the unit. Pt notified that she cannot leave but said she needed to any ways. MD made aware, resident came to bedside. Psych contacted, in which he stated he had no safety concerns for this pt. Pt said she was leaving AMA. All IVs were removed and pt signed AMA form. Pt educated on safety and status of condition.
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
## 2023-07-10 NOTE — Assessment & Plan Note (Signed)
New complaint this morning, though she says she has had symptoms since arriving here. It is certainly possible she has some sort of pill gastritis at play. Will try to treat symptomatically for now, do not feel GI workup is indicated just yet. - Protonix 40mg  daily - PRN TUMS

## 2023-07-10 NOTE — Discharge Instructions (Addendum)
Dear Jessica Walton,  Thank you for letting us participate in your care. You were hospitalized for altered mental status and diagnosed with Overdose of antipsychotic. You were treated with monitoring.   POST-HOSPITAL & CARE INSTRUCTIONS Try to see your psych provider Illene Labrador before the current appointment. If you cannot, refer to below for walk in hours.  Go to your follow up appointments (listed below)   DOCTOR'S APPOINTMENT   Future Appointments  Date Time Provider Department Center  08/17/2023  2:00 PM [place], Tommas Olp, [place] [facility] None     Take care and be well!  Family Medicine Teaching Service Inpatient Team [place]  [facility]  [address] [place], [place] [address] [phone]  ------------------------------ [name] OUT[facility]IENT Walk-in information:  Please note, all walk-ins are first come & first serve, with limited number of availability.  Therapist for therapy:  Monday & Wednesdays: Please ARRIVE at 7:00 AM for registration Will START at 8:00 AM Every 1st & 2nd Friday of the month: Please ARRIVE at 10:00 AM for registration Will START at 1 PM - 5 PM Psychiatrist for medication management: Monday - Friday:  Please ARRIVE at 7:00 AM for registration Will START at 8:00 AM    Regretfully, due to limited availability, please be aware that you may not been seen on the same day as walk-in. Please consider making an appoint or try again. Thank you for your patience and understanding.  [facility] [place] [address] [place], [place] [address] [phone]  New patients are seen at their walk-in clinic. Walk-in hours are Monday - Friday from 8:30 am - 12:00 pm, and from 1:00 pm - 2:30 pm.   Walk-in patients are seen on a first come, first served basis, so try to arrive as early as possible for the best chance of being seen the same day.

## 2023-07-10 NOTE — Assessment & Plan Note (Deleted)
Trending down with MIVF.  Likely in the setting of overdose as stated above.

## 2023-07-10 NOTE — Assessment & Plan Note (Addendum)
Will consult psychiatry today given patient is reaching medical stability. Concern for intentional overdose, though patient denies. - consult psych - holding meds in setting of overdose

## 2023-07-10 NOTE — Progress Notes (Signed)
Pt complaining of burning at foley insertion site. MD paged for removal. Per MD, Elberta Fortis, foley to be d/c'd.  Foley removed. Pt d/t urinate by 1445. Will bladder scan q6 to monitor for retention, per MD order.

## 2023-07-12 LAB — CULTURE, BLOOD (ROUTINE X 2)
Culture: NO GROWTH
Culture: NO GROWTH
Special Requests: ADEQUATE

## 2023-07-12 NOTE — Telephone Encounter (Signed)
Message acknowledged and reviewed. Patient's situation was brought to provider's attention. Patient will continue to follow up with the progress of the patient.

## 2023-07-27 ENCOUNTER — Encounter (HOSPITAL_COMMUNITY): Payer: Self-pay

## 2023-07-27 ENCOUNTER — Ambulatory Visit (HOSPITAL_COMMUNITY)
Admission: EM | Admit: 2023-07-27 | Discharge: 2023-07-27 | Disposition: A | Discharge status: Home / Self Care | Payer: MEDICAID

## 2023-07-27 DIAGNOSIS — J069 Acute upper respiratory infection, unspecified: Secondary | ICD-10-CM

## 2023-07-27 DIAGNOSIS — K529 Noninfective gastroenteritis and colitis, unspecified: Secondary | ICD-10-CM

## 2023-07-27 LAB — POC COVID19/FLU A&B COMBO
Covid Antigen, POC: NEGATIVE
Influenza A Antigen, POC: NEGATIVE
Influenza B Antigen, POC: NEGATIVE

## 2023-07-27 MED ORDER — ONDANSETRON 4 MG PO TBDP
4.0000 mg | ORAL_TABLET | Freq: Once | ORAL | Status: AC
Start: 2023-07-27 — End: 2023-07-27
  Administered 2023-07-27: 4 mg via ORAL

## 2023-07-27 MED ORDER — ONDANSETRON 4 MG PO TBDP
ORAL_TABLET | ORAL | Status: AC
  Filled 2023-07-27: qty 1

## 2023-07-27 MED ORDER — ONDANSETRON 4 MG PO TBDP
4.0000 mg | ORAL_TABLET | Freq: Three times a day (TID) | ORAL | 0 refills | Status: DC | PRN
Start: 2023-07-27 — End: 2023-10-28

## 2023-07-27 MED ORDER — FLUTICASONE PROPIONATE 50 MCG/ACT NA SUSP: 2.0000 | Freq: Every day | NASAL | 0 refills | Status: DC

## 2023-07-27 MED ORDER — BENZONATATE 200 MG PO CAPS
200.0000 mg | ORAL_CAPSULE | Freq: Three times a day (TID) | ORAL | 0 refills | Status: DC | PRN
Start: 2023-07-27 — End: 2023-10-28

## 2023-07-27 NOTE — ED Triage Notes (Signed)
 Patient here today with c/o cough, ST, nasal congestion, sinus pressure, chills, sweats, body aches, and vomiting X 5 days. She had a little diarrhea the first couple of days. She has been using cough drops with little relief. No sick contacts.

## 2023-07-27 NOTE — ED Provider Notes (Signed)
 [facility]    CSN: [id number] Arrival date & time: 07/27/23  1742      History   Chief Complaint Chief Complaint  Patient presents with   Cough    HPI Jessica Walton is a 38 y.o. female who presents with onset of URI symptoms, chills, aches, HA N/V/D x 5 days. She has vomited 3 times a  day and has had slight diarrhea 2-3 times a  day. Denies fever or sweats.     Past Medical History:  Diagnosis Date   Asthma    No inhaler use x 1 yr (2013)   Bipolar 1 disorder (HCC)    Cocaine abuse (HCC) 2018   Headache    History of suicide attempt 2015   Hypertension    Lordosis    Opiate abuse (HCC) 2018   Opioid use disorder, severe, dependence (HCC) 07/25/2016   PTSD (post-traumatic stress disorder)    Seizures (HCC)    Tetrahydrocannabinol (THC) dependence (HCC)     Patient Active Problem List   Diagnosis Date Noted   T wave inversion in EKG 07/09/2023   Heartburn 07/09/2023   Antipsychotic overdose 07/08/2023   AKI (acute kidney injury) (HCC) 07/07/2023   Overdose of antipsychotic 07/07/2023   Schizophrenia (HCC) 07/07/2023   Lactic acidosis 07/07/2023   Tachycardia 07/07/2023   Bipolar affective disorder, currently depressed, moderate (HCC) 09/23/2022   Insomnia 09/23/2022   Generalized anxiety disorder 09/23/2022   BV (bacterial vaginosis) 04/06/2022   Substance use disorder 04/01/2022   Incompetency, cervical 10/07/2017   Inevitable abortion 10/06/2017   Cervical incompetence, antepartum 10/06/2017   Tetrahydrocannabinol (THC) dependence (HCC)    Seizures (HCC)    PTSD (post-traumatic stress disorder)    Opioid use disorder, severe, dependence (HCC) 07/25/2016   Cocaine abuse (HCC) 07/25/2016   Seizure, convulsion (HCC) 04/30/2015   History of suicide attempt 07/25/2013   Supervision of high-risk pregnancy 07/03/2013   History of prior pregnancy with short cervix, currently pregnant in second trimester 07/03/2013    Past Surgical History:   Procedure Laterality Date   EYE SURGERY     WISDOM TOOTH EXTRACTION     x4    OB History     Gravida  5   Para  3   Term  1   Preterm  1   AB  2   Living  1      SAB  1   IAB  1   Ectopic  0   Multiple  0   Live Births  1            Home Medications    Prior to Admission medications   Medication Sig Start Date End Date Taking? Authorizing Provider  benzonatate  (TESSALON ) 200 MG capsule Take 1 capsule (200 mg total) by mouth 3 (three) times daily as needed for cough. 07/27/23  Yes Rodriguez-Southworth, Arthea Nobel, PA-C  fluticasone  (FLONASE ) 50 MCG/ACT nasal spray Place 2 sprays into both nostrils daily. 07/27/23  Yes Rodriguez-Southworth, Kyra, PA-C  OLANZapine  (ZYPREXA ) 10 MG tablet Take 10 mg by mouth at bedtime.   Yes [provider]  ondansetron  (ZOFRAN -ODT) 4 MG disintegrating tablet Take 1 tablet (4 mg total) by mouth every 8 (eight) hours as needed for nausea or vomiting. 07/27/23  Yes Rodriguez-Southworth, Cross Jorge, PA-C  mirtazapine  (REMERON ) 30 MG tablet Take 1 tablet (30 mg total) by mouth at bedtime. 07/06/23   Nwoko, Uchenna E, PA    Family History Family History  Problem Relation  Age of Onset   Hypertension Maternal Grandmother    Diabetes Maternal Grandmother    Seizures Neg Hx     Social History Social History   Tobacco Use   Smoking status: Former    Current packs/day: 0.50    Average packs/day: 0.5 packs/day for 10.0 years (5.0 ttl pk-yrs)    Types: Cigarettes   Smokeless tobacco: Never  Vaping Use   Vaping status: Never Used  Substance Use Topics   Alcohol  use: Not Currently    Comment: occasional   Drug use: No     Allergies   Benadryl  [diphenhydramine  hcl] and Vicodin [hydrocodone -acetaminophen ]   Review of Systems Review of Systems  As noted in HPI Physical Exam Triage Vital Signs ED Triage Vitals [07/27/23 1927]  Encounter Vitals Group     BP (!) 147/97     Systolic BP Percentile      Diastolic BP  Percentile      Pulse Rate 76     Resp 16     Temp 98.6 F (37 C)     Temp Source Oral     SpO2 96 %     Weight 220 lb (99.8 kg)     Height 5' 9 (1.753 m)     Head Circumference      Peak Flow      Pain Score 8     Pain Loc      Pain Education      Exclude from Growth Chart    No data found.  Updated Vital Signs BP (!) 147/97 (BP Location: Left Arm)   Pulse 76   Temp 98.6 F (37 C) (Oral)   Resp 16   Ht 5' 9 (1.753 m)   Wt 220 lb (99.8 kg)   LMP 07/13/2023 (Approximate)   SpO2 96%   BMI 32.49 kg/m   Visual Acuity Right Eye Distance:   Left Eye Distance:   Bilateral Distance:    Right Eye Near:   Left Eye Near:    Bilateral Near:     Physical Exam Physical Exam Vitals signs and nursing note reviewed.  Constitutional:      General: She is not in acute distress.    Appearance: Normal appearance. She is not ill-appearing, toxic-appearing or diaphoretic.  HENT:     Head: Normocephalic.     Right Ear: Tympanic membrane, ear canal and external ear normal.     Left Ear: Tympanic membrane, ear canal and external ear normal.     Nose: Nose normal.     Mouth/Throat:     Mouth: Mucous membranes are moist.  Eyes:     General: No scleral icterus.       Right eye: No discharge.        Left eye: No discharge.     Conjunctiva/sclera: Conjunctivae normal.  Neck:     Musculoskeletal: Neck supple. No neck rigidity.  Cardiovascular:     Rate and Rhythm: Normal rate and regular rhythm.     Heart sounds: No murmur.  Pulmonary:     Effort: Pulmonary effort is normal.     Breath sounds: Normal breath sounds.  Abdominal:     General: Bowel sounds are normal. There is no distension.     Palpations: Abdomen is soft. There is no mass.     Tenderness: There is no abdominal tenderness. There is no guarding or rebound.     Hernia: No hernia is present.  Musculoskeletal: Normal range of motion.  Lymphadenopathy:  Cervical: No cervical adenopathy.  Skin:    General: Skin  is warm and dry.     Coloration: Skin is not jaundiced.     Findings: No rash.  Neurological:     Mental Status: She is alert and oriented to person, place, and time.     Gait: Gait normal.  Psychiatric:        Mood and Affect: Mood normal.        Behavior: Behavior normal.        Thought Content: Thought content normal.        Judgment: Judgment normal.    UC Treatments / Results  Labs (all labs ordered are listed, but only abnormal results are displayed) Labs Reviewed  POC COVID19/FLU A&B COMBO  Covid and Flu test were negative  EKG   Radiology No results found.  Procedures Procedures (including critical care time)  Medications Ordered in UC Medications  ondansetron  (ZOFRAN -ODT) disintegrating tablet 4 mg (has no administration in time range)    Initial Impression / Assessment and Plan / UC Course  I have reviewed the triage vital signs and the nursing notes.  Pertinent labs  results that were available during my care of the patient were reviewed by me and considered in my medical decision making (see chart for details).  Viral GE URI  She was placed on Zofran , Flonase  and tessalon  as noted.  See instructions.    Final Clinical Impressions(s) / UC Diagnoses   Final diagnoses:  Viral URI with cough  Noninfectious gastroenteritis, unspecified type     Discharge Instructions      Your covid and flu test are negative, you have have a cold and stomach virus You may take Pepto for the diarrhea  and avoid dairy.  I sent nausea medication and something for your congestion and cough.      ED Prescriptions     Medication Sig Dispense Auth. Provider   ondansetron  (ZOFRAN -ODT) 4 MG disintegrating tablet Take 1 tablet (4 mg total) by mouth every 8 (eight) hours as needed for nausea or vomiting. 20 tablet Rodriguez-Southworth, Grete Bosko, PA-C   benzonatate  (TESSALON ) 200 MG capsule Take 1 capsule (200 mg total) by mouth 3 (three) times daily as needed for cough.  30 capsule Rodriguez-Southworth, Brenin Heidelberger, PA-C   fluticasone  (FLONASE ) 50 MCG/ACT nasal spray Place 2 sprays into both nostrils daily. 11.1 g Rodriguez-Southworth, Kyra, PA-C      PDMP not reviewed this encounter.   Lindi Kyra, PA-C 07/27/23 2025

## 2023-07-27 NOTE — Discharge Instructions (Addendum)
 Your covid and flu test are negative, you have have a cold and stomach virus You may take Pepto for the diarrhea  and avoid dairy.  I sent nausea medication and something for your congestion and cough.

## 2023-08-17 ENCOUNTER — Encounter (HOSPITAL_COMMUNITY): Payer: Medicaid Other | Admitting: Physician Assistant

## 2023-08-29 ENCOUNTER — Ambulatory Visit (HOSPITAL_COMMUNITY)
Admission: EM | Admit: 2023-08-29 | Discharge: 2023-08-29 | Disposition: A | Discharge status: Home / Self Care | Payer: MEDICAID | Attending: Emergency Medicine | Admitting: Emergency Medicine

## 2023-08-29 ENCOUNTER — Encounter (HOSPITAL_COMMUNITY): Payer: Self-pay | Admitting: *Deleted

## 2023-08-29 DIAGNOSIS — R030 Elevated blood-pressure reading, without diagnosis of hypertension: Secondary | ICD-10-CM | POA: Diagnosis not present

## 2023-08-29 MED ORDER — HYDROCHLOROTHIAZIDE 12.5 MG PO TABS: 12.5000 mg | ORAL_TABLET | Freq: Every morning | ORAL | 2 refills | Status: AC

## 2023-08-29 MED ORDER — AMLODIPINE BESYLATE 5 MG PO TABS: 5.0000 mg | ORAL_TABLET | Freq: Every day | ORAL | 2 refills | Status: AC

## 2023-08-29 NOTE — ED Provider Notes (Signed)
 [facility]    CSN: [id number] Arrival date & time: 08/29/23  1554    HISTORY   Chief Complaint  Patient presents with   Anxiety   Tachycardia   Hypertension   HPI Jessica Walton is a pleasant, 38 y.o. female who presents to urgent care today. Patient reports increased anxiety as of late.  Patient states she checked her blood pressure and her heart rate today and both were elevated.  Patient states this made her concerned and decided to come to urgent care for further evaluation.  Patient states she is currently wearing a clonidine  patch that was given to her by a friend.  EMR reviewed, patient has a history of polysubstance abuse, bipolar disorder, schizoaffective disorder.  I do not see that patient has had a history of hypertension in the past.  Patient denies chest pain, shortness of breath, fatigue.  Patient endorses ankle swelling in the evenings, persistent dull headache and occasional dizziness.  The history is provided by the patient.   Past Medical History:  Diagnosis Date   Asthma    No inhaler use x 1 yr (2013)   Bipolar 1 disorder (HCC)    Cocaine abuse (HCC) 2018   Headache    History of suicide attempt 2015   Hypertension    Lordosis    Opiate abuse (HCC) 2018   Opioid use disorder, severe, dependence (HCC) 07/25/2016   PTSD (post-traumatic stress disorder)    Seizures (HCC)    Tetrahydrocannabinol (THC) dependence (HCC)    Patient Active Problem List   Diagnosis Date Noted   T wave inversion in EKG 07/09/2023   Heartburn 07/09/2023   Antipsychotic overdose 07/08/2023   AKI (acute kidney injury) (HCC) 07/07/2023   Overdose of antipsychotic 07/07/2023   Schizophrenia (HCC) 07/07/2023   Lactic acidosis 07/07/2023   Tachycardia 07/07/2023   Bipolar affective disorder, currently depressed, moderate (HCC) 09/23/2022   Insomnia 09/23/2022   Generalized anxiety disorder 09/23/2022   BV (bacterial vaginosis) 04/06/2022   Substance use disorder  04/01/2022   Incompetency, cervical 10/07/2017   Inevitable abortion 10/06/2017   Cervical incompetence, antepartum 10/06/2017   Tetrahydrocannabinol (THC) dependence (HCC)    Seizures (HCC)    PTSD (post-traumatic stress disorder)    Opioid use disorder, severe, dependence (HCC) 07/25/2016   Cocaine abuse (HCC) 07/25/2016   Seizure, convulsion (HCC) 04/30/2015   History of suicide attempt 07/25/2013   Supervision of high-risk pregnancy 07/03/2013   History of prior pregnancy with short cervix, currently pregnant in second trimester 07/03/2013   Past Surgical History:  Procedure Laterality Date   EYE SURGERY     WISDOM TOOTH EXTRACTION     x4   OB History     Gravida  5   Para  3   Term  1   Preterm  1   AB  2   Living  1      SAB  1   IAB  1   Ectopic  0   Multiple  0   Live Births  1          Home Medications    Prior to Admission medications   Medication Sig Start Date End Date Taking? Authorizing Provider  amLODipine  (NORVASC ) 5 MG tablet Take 1 tablet (5 mg total) by mouth daily. 08/29/23 11/27/23 Yes Joesph Shaver Scales, PA-C  hydrochlorothiazide  (HYDRODIURIL ) 12.5 MG tablet Take 1 tablet (12.5 mg total) by mouth in the morning. 08/29/23 11/27/23 Yes Joesph Shaver Scales, PA-C  benzonatate  (TESSALON ) 200 MG capsule Take 1 capsule (200 mg total) by mouth 3 (three) times daily as needed for cough. 07/27/23   Rodriguez-Southworth, Sylvia, PA-C  fluticasone  (FLONASE ) 50 MCG/ACT nasal spray Place 2 sprays into both nostrils daily. 07/27/23   Rodriguez-Southworth, Sylvia, PA-C  mirtazapine  (REMERON ) 30 MG tablet Take 1 tablet (30 mg total) by mouth at bedtime. 07/06/23   Nwoko, Uchenna E, PA  OLANZapine  (ZYPREXA ) 10 MG tablet Take 10 mg by mouth at bedtime.    [provider]  ondansetron  (ZOFRAN -ODT) 4 MG disintegrating tablet Take 1 tablet (4 mg total) by mouth every 8 (eight) hours as needed for nausea or vomiting. 07/27/23   Rodriguez-Southworth,  Kyra, PA-C    Family History Family History  Problem Relation Age of Onset   Hypertension Maternal Grandmother    Diabetes Maternal Grandmother    Seizures Neg Hx    Social History Social History   Tobacco Use   Smoking status: Former    Current packs/day: 0.50    Average packs/day: 0.5 packs/day for 10.0 years (5.0 ttl pk-yrs)    Types: Cigarettes   Smokeless tobacco: Never  Vaping Use   Vaping status: Every Day  Substance Use Topics   Alcohol  use: Not Currently    Comment: occasional   Drug use: No   Allergies   Benadryl  [diphenhydramine  hcl] and Vicodin [hydrocodone -acetaminophen ]  Review of Systems Review of Systems Pertinent findings revealed after performing a 14 point review of systems has been noted in the history of present illness.  Physical Exam Vital Signs BP (!) 165/113 (BP Location: Left Arm)   Pulse (!) 110   Temp 98.4 F (36.9 C) (Oral)   Resp 18   LMP 08/28/2023 (Exact Date)   SpO2 98%   No data found.  Physical Exam Vitals and nursing note reviewed.  Constitutional:      General: She is not in acute distress.    Appearance: Normal appearance.  HENT:     Head: Normocephalic and atraumatic.  Eyes:     Pupils: Pupils are equal, round, and reactive to light.  Cardiovascular:     Rate and Rhythm: Normal rate and regular rhythm.  Pulmonary:     Effort: Pulmonary effort is normal.     Breath sounds: Normal breath sounds.  Musculoskeletal:        General: Normal range of motion.     Cervical back: Normal range of motion and neck supple.  Skin:    General: Skin is warm and dry.  Neurological:     General: No focal deficit present.     Mental Status: She is alert and oriented to person, place, and time. Mental status is at baseline.  Psychiatric:        Mood and Affect: Mood normal.        Behavior: Behavior normal.        Thought Content: Thought content normal.        Judgment: Judgment normal.     Visual Acuity Right Eye  Distance:   Left Eye Distance:   Bilateral Distance:    Right Eye Near:   Left Eye Near:    Bilateral Near:     UC Couse / Diagnostics / Procedures:     Radiology No results found.  Procedures Procedures (including critical care time) EKG  Pending results:  Labs Reviewed - No data to display  Medications Ordered in UC: Medications - No data to display  UC Diagnoses / Final Clinical Impressions(s)  I have reviewed the triage vital signs and the nursing notes.  Pertinent labs & imaging results that were available during my care of the patient were reviewed by me and considered in my medical decision making (see chart for details).    Final diagnoses:  Blood pressure elevated without history of HTN   EKG today is unremarkable.  Patient advised to remove the clonidine  patch and not take or use medications prescribed for other people.  Will start patient on hydrochlorothiazide  and amlodipine .  Patient encouraged to find a PCP for further follow-up for blood pressure.  Please see discharge instructions below for details of plan of care as provided to patient. ED Prescriptions     Medication Sig Dispense Auth. Provider   amLODipine  (NORVASC ) 5 MG tablet Take 1 tablet (5 mg total) by mouth daily. 30 tablet Joesph Shaver Scales, PA-C   hydrochlorothiazide  (HYDRODIURIL ) 12.5 MG tablet Take 1 tablet (12.5 mg total) by mouth in the morning. 30 tablet Joesph Shaver Scales, PA-C      PDMP not reviewed this encounter.  Pending results:  Labs Reviewed - No data to display    Discharge Instructions      For control of your blood pressure I provided you with 2 medications.  One is called hydrochlorothiazide  and the other is amlodipine .  Please take both of them together every morning.  Hydrochlorothiazide  is a diuretic that helps your body eliminate excess fluid by making you pee more frequently.  This will help reduce the swelling in your ankles.  Amlodipine  is a calcium   channel blocker that relaxes the smooth muscles that tighten arteries.  When the arteries are less tight, blood flows more freely and your pressure comes down.  Please use the QR code on this visit summary to schedule an appointment with a primary care provider.  Thank you for visiting [facility] today.    Disposition Upon Discharge:  Condition: stable for discharge home  Patient presented with an acute illness with associated systemic symptoms and significant discomfort requiring urgent management. In my opinion, this is a condition that a prudent lay person (someone who possesses an average knowledge of health and medicine) may potentially expect to result in complications if not addressed urgently such as respiratory distress, impairment of bodily function or dysfunction of bodily organs.   Routine symptom specific, illness specific and/or disease specific instructions were discussed with the patient and/or caregiver at length.   As such, the patient has been evaluated and assessed, work-up was performed and treatment was provided in alignment with urgent care protocols and evidence based medicine.  Patient/parent/caregiver has been advised that the patient may require follow up for further testing and treatment if the symptoms continue in spite of treatment, as clinically indicated and appropriate.  Patient/parent/caregiver has been advised to return to the [facility] or PCP if no better; to PCP or the Emergency Department if new signs and symptoms develop, or if the current signs or symptoms continue to change or worsen for further workup, evaluation and treatment as clinically indicated and appropriate  The patient will follow up with their current PCP if and as advised. If the patient does not currently have a PCP we will assist them in obtaining one.   The patient may need specialty follow up if the symptoms continue, in spite of conservative treatment and management, for further  workup, evaluation, consultation and treatment as clinically indicated and appropriate.  Patient/parent/caregiver verbalized understanding and agreement of plan as discussed.  All questions were addressed during visit.  Please see discharge instructions below for further details of plan.  This office note has been dictated using Teaching laboratory technician.  Unfortunately, this method of dictation can sometimes lead to typographical or grammatical errors.  I apologize for your inconvenience in advance if this occurs.  Please do not hesitate to reach out to me if clarification is needed.      Joesph Shaver Scales, PA-C 08/29/23 1758

## 2023-08-29 NOTE — Discharge Instructions (Signed)
 For control of your blood pressure I provided you with 2 medications.  One is called hydrochlorothiazide  and the other is amlodipine .  Please take both of them together every morning.  Hydrochlorothiazide  is a diuretic that helps your body eliminate excess fluid by making you pee more frequently.  This will help reduce the swelling in your ankles.  Amlodipine  is a calcium  channel blocker that relaxes the smooth muscles that tighten arteries.  When the arteries are less tight, blood flows more freely and your pressure comes down.  Please use the QR code on this visit summary to schedule an appointment with a primary care provider.  Thank you for visiting [facility] today.

## 2023-08-29 NOTE — ED Notes (Signed)
When completing the EKG pt advised me a friend gave her a clonidine patch and she is currently wearing it.

## 2023-08-29 NOTE — ED Triage Notes (Signed)
 Pt states she has been having a lot of anxiety. Today she checked her BP and HR she states both were elevated. She is unsure of her BP reading but her HR was 132   Pt states she has a dx of anxiety but has been under more stress recently. She does not take ay meds currently.

## 2023-08-30 ENCOUNTER — Other Ambulatory Visit (HOSPITAL_COMMUNITY): Payer: Self-pay | Admitting: Physician Assistant

## 2023-08-30 ENCOUNTER — Telehealth (HOSPITAL_COMMUNITY): Payer: Self-pay

## 2023-08-30 DIAGNOSIS — F3132 Bipolar disorder, current episode depressed, moderate: Secondary | ICD-10-CM

## 2023-08-30 NOTE — Telephone Encounter (Signed)
 Patient called pertaining to the OLANZapine  (ZYPREXA ) 10 MG tablet States her pharmacy doesn't have the medication and it wasn't sent .

## 2023-09-01 ENCOUNTER — Telehealth (HOSPITAL_COMMUNITY): Payer: Self-pay | Admitting: *Deleted

## 2023-09-01 ENCOUNTER — Telehealth (HOSPITAL_COMMUNITY): Payer: Self-pay

## 2023-09-01 NOTE — Telephone Encounter (Signed)
 Pt needs prior AUTH done on medication, Pt never stated which one. Says her Provider is Dr. Berta Brittle

## 2023-09-01 NOTE — Telephone Encounter (Signed)
 Fax received for PA of Olanzapine . Submitted online with cover my meds. Awaiting decision.

## 2023-09-01 NOTE — Telephone Encounter (Signed)
 Fax received for PA approval of Olanzapine  5mg  until 08/31/24. Called to notify pharmacy.

## 2023-09-06 ENCOUNTER — Other Ambulatory Visit (HOSPITAL_COMMUNITY): Payer: Self-pay | Admitting: Physician Assistant

## 2023-09-06 ENCOUNTER — Telehealth (HOSPITAL_COMMUNITY): Payer: Self-pay | Admitting: Physician Assistant

## 2023-09-06 DIAGNOSIS — F3132 Bipolar disorder, current episode depressed, moderate: Secondary | ICD-10-CM

## 2023-09-06 MED ORDER — OLANZAPINE 10 MG PO TABS
10.0000 mg | ORAL_TABLET | Freq: Every day | ORAL | 1 refills | Status: DC
Start: 2023-09-06 — End: 2023-10-04

## 2023-09-06 NOTE — Progress Notes (Signed)
Provider was contacted by patient to request medication refill (olanzapine).  Provider to refill patient's medication.

## 2023-09-06 NOTE — Telephone Encounter (Signed)
Message acknowledged and reviewed.

## 2023-09-06 NOTE — Telephone Encounter (Signed)
Message acknowledged and reviewed. Patient's medication to be e-prescribed to pharmacy of choice.

## 2023-09-20 ENCOUNTER — Encounter (HOSPITAL_COMMUNITY): Payer: Medicaid Other | Admitting: Physician Assistant

## 2023-09-21 ENCOUNTER — Telehealth (HOSPITAL_COMMUNITY): Payer: MEDICAID | Admitting: Physician Assistant

## 2023-09-21 ENCOUNTER — Encounter (HOSPITAL_COMMUNITY): Payer: Self-pay

## 2023-09-28 ENCOUNTER — Other Ambulatory Visit (HOSPITAL_COMMUNITY): Payer: Self-pay | Admitting: Physician Assistant

## 2023-09-28 DIAGNOSIS — F3132 Bipolar disorder, current episode depressed, moderate: Secondary | ICD-10-CM

## 2023-10-11 ENCOUNTER — Other Ambulatory Visit (HOSPITAL_COMMUNITY): Payer: Self-pay | Admitting: Physician Assistant

## 2023-10-11 DIAGNOSIS — F4310 Post-traumatic stress disorder, unspecified: Secondary | ICD-10-CM

## 2023-10-11 DIAGNOSIS — F3132 Bipolar disorder, current episode depressed, moderate: Secondary | ICD-10-CM

## 2023-10-11 DIAGNOSIS — F411 Generalized anxiety disorder: Secondary | ICD-10-CM

## 2023-10-11 MED ORDER — MIRTAZAPINE 45 MG PO TABS: 45.0000 mg | ORAL_TABLET | Freq: Every day | ORAL | 1 refills | Status: DC

## 2023-10-11 MED ORDER — OLANZAPINE 15 MG PO TABS: 15.0000 mg | ORAL_TABLET | Freq: Every day | ORAL | 1 refills | Status: DC

## 2023-10-11 NOTE — Progress Notes (Signed)
 Provider was able to reach out to patient regarding their concerns.  Patient informed provider that her blood pressure and pulse have been elevated due to her elevated anxiety and paranoia.  Patient discussed adjusting medications with provider to help alleviate her symptoms.  Provider recommended increasing patient's mirtazapine from 30 mg to 45 mg at bedtime for the management of her mood and anxiety.  Provider also recommended increasing her olanzapine from 10 mg to 15 mg at bedtime for mood stability.  Patient was agreeable to recommendation.  Patient's medications to be e-prescribed to pharmacy of choice. Patient was scheduled an appointment for tomorrow.

## 2023-10-12 ENCOUNTER — Encounter (HOSPITAL_COMMUNITY): Payer: Self-pay | Admitting: Physician Assistant

## 2023-10-12 ENCOUNTER — Ambulatory Visit (HOSPITAL_COMMUNITY): Payer: MEDICAID | Admitting: Physician Assistant

## 2023-10-12 VITALS — BP 133/111 | HR 105 | Temp 97.8°F | Ht 69.0 in | Wt 213.0 lb

## 2023-10-12 DIAGNOSIS — F3132 Bipolar disorder, current episode depressed, moderate: Secondary | ICD-10-CM

## 2023-10-12 DIAGNOSIS — F4310 Post-traumatic stress disorder, unspecified: Secondary | ICD-10-CM | POA: Diagnosis not present

## 2023-10-12 DIAGNOSIS — F411 Generalized anxiety disorder: Secondary | ICD-10-CM | POA: Diagnosis not present

## 2023-10-12 MED ORDER — PRAZOSIN HCL 1 MG PO CAPS: 1.0000 mg | ORAL_CAPSULE | Freq: Every day | ORAL | 1 refills | Status: DC
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
## 2023-10-26 ENCOUNTER — Telehealth (HOSPITAL_COMMUNITY): Payer: Self-pay | Admitting: *Deleted

## 2023-10-26 NOTE — Telephone Encounter (Signed)
 Fax received for 90 prescription request for Prazosin 1mg .

## 2023-10-27 ENCOUNTER — Other Ambulatory Visit (HOSPITAL_COMMUNITY): Payer: Self-pay | Admitting: Physician Assistant

## 2023-10-27 DIAGNOSIS — F411 Generalized anxiety disorder: Secondary | ICD-10-CM

## 2023-10-27 DIAGNOSIS — F3132 Bipolar disorder, current episode depressed, moderate: Secondary | ICD-10-CM

## 2023-10-27 DIAGNOSIS — F4310 Post-traumatic stress disorder, unspecified: Secondary | ICD-10-CM

## 2023-10-27 MED ORDER — PRAZOSIN HCL 1 MG PO CAPS: 1.0000 mg | ORAL_CAPSULE | Freq: Every day | ORAL | 0 refills | Status: DC

## 2023-10-27 MED ORDER — OLANZAPINE 15 MG PO TABS: 15.0000 mg | ORAL_TABLET | Freq: Every day | ORAL | 1 refills | Status: DC

## 2023-10-27 MED ORDER — MIRTAZAPINE 45 MG PO TABS: 45.0000 mg | ORAL_TABLET | Freq: Every day | ORAL | 1 refills | Status: DC

## 2023-10-27 NOTE — Telephone Encounter (Signed)
 Message acknowledged and reviewed.
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
## 2023-10-27 NOTE — Progress Notes (Signed)
 Provider was contacted by Elder Love, RN regarding request forms pharmacy to provide a 90-day supply of patient's prazosin.  Prescription to be filled.  Patient's medication will be e-prescribed to pharmacy of choice.

## 2023-10-28 ENCOUNTER — Emergency Department (HOSPITAL_COMMUNITY): Payer: MEDICAID

## 2023-10-28 ENCOUNTER — Inpatient Hospital Stay (HOSPITAL_COMMUNITY)
Admission: EM | Admit: 2023-10-28 | Discharge: 2023-10-29 | DRG: 917 | Disposition: A | Discharge status: Home / Self Care | Payer: MEDICAID | Attending: Pulmonary Disease | Admitting: Pulmonary Disease

## 2023-10-28 ENCOUNTER — Encounter (HOSPITAL_COMMUNITY): Payer: Self-pay | Admitting: Emergency Medicine

## 2023-10-28 ENCOUNTER — Inpatient Hospital Stay (HOSPITAL_COMMUNITY): Payer: MEDICAID

## 2023-10-28 DIAGNOSIS — G929 Unspecified toxic encephalopathy: Secondary | ICD-10-CM | POA: Diagnosis not present

## 2023-10-28 DIAGNOSIS — G928 Other toxic encephalopathy: Secondary | ICD-10-CM | POA: Diagnosis present

## 2023-10-28 DIAGNOSIS — Z9151 Personal history of suicidal behavior: Secondary | ICD-10-CM | POA: Diagnosis not present

## 2023-10-28 DIAGNOSIS — Z888 Allergy status to other drugs, medicaments and biological substances status: Secondary | ICD-10-CM | POA: Diagnosis not present

## 2023-10-28 DIAGNOSIS — F1210 Cannabis abuse, uncomplicated: Secondary | ICD-10-CM | POA: Diagnosis present

## 2023-10-28 DIAGNOSIS — Z833 Family history of diabetes mellitus: Secondary | ICD-10-CM

## 2023-10-28 DIAGNOSIS — F319 Bipolar disorder, unspecified: Secondary | ICD-10-CM | POA: Diagnosis present

## 2023-10-28 DIAGNOSIS — N179 Acute kidney failure, unspecified: Secondary | ICD-10-CM | POA: Diagnosis present

## 2023-10-28 DIAGNOSIS — T43021A Poisoning by tetracyclic antidepressants, accidental (unintentional), initial encounter: Principal | ICD-10-CM | POA: Diagnosis present

## 2023-10-28 DIAGNOSIS — F17210 Nicotine dependence, cigarettes, uncomplicated: Secondary | ICD-10-CM | POA: Diagnosis present

## 2023-10-28 DIAGNOSIS — Z885 Allergy status to narcotic agent status: Secondary | ICD-10-CM | POA: Diagnosis not present

## 2023-10-28 DIAGNOSIS — G9340 Encephalopathy, unspecified: Secondary | ICD-10-CM | POA: Diagnosis present

## 2023-10-28 DIAGNOSIS — E162 Hypoglycemia, unspecified: Secondary | ICD-10-CM | POA: Diagnosis not present

## 2023-10-28 DIAGNOSIS — Z79899 Other long term (current) drug therapy: Secondary | ICD-10-CM

## 2023-10-28 DIAGNOSIS — J9600 Acute respiratory failure, unspecified whether with hypoxia or hypercapnia: Secondary | ICD-10-CM | POA: Diagnosis present

## 2023-10-28 DIAGNOSIS — R410 Disorientation, unspecified: Secondary | ICD-10-CM

## 2023-10-28 DIAGNOSIS — F1410 Cocaine abuse, uncomplicated: Secondary | ICD-10-CM | POA: Diagnosis present

## 2023-10-28 DIAGNOSIS — I10 Essential (primary) hypertension: Secondary | ICD-10-CM | POA: Diagnosis present

## 2023-10-28 DIAGNOSIS — E876 Hypokalemia: Secondary | ICD-10-CM | POA: Diagnosis present

## 2023-10-28 DIAGNOSIS — Z8249 Family history of ischemic heart disease and other diseases of the circulatory system: Secondary | ICD-10-CM | POA: Diagnosis not present

## 2023-10-28 DIAGNOSIS — T50904A Poisoning by unspecified drugs, medicaments and biological substances, undetermined, initial encounter: Principal | ICD-10-CM

## 2023-10-28 DIAGNOSIS — J45909 Unspecified asthma, uncomplicated: Secondary | ICD-10-CM | POA: Diagnosis present

## 2023-10-28 DIAGNOSIS — F17290 Nicotine dependence, other tobacco product, uncomplicated: Secondary | ICD-10-CM | POA: Diagnosis present

## 2023-10-28 DIAGNOSIS — T50901A Poisoning by unspecified drugs, medicaments and biological substances, accidental (unintentional), initial encounter: Principal | ICD-10-CM | POA: Diagnosis present

## 2023-10-28 LAB — RAPID URINE DRUG SCREEN, HOSP PERFORMED
Amphetamines: NOT DETECTED
Barbiturates: NOT DETECTED
Benzodiazepines: NOT DETECTED
Cocaine: NOT DETECTED
Opiates: NOT DETECTED
Tetrahydrocannabinol: NOT DETECTED

## 2023-10-28 LAB — CBC
HCT: 35.8 % — ABNORMAL LOW (ref 36.0–46.0)
Hemoglobin: 12.2 g/dL (ref 12.0–15.0)
MCH: 28.6 pg (ref 26.0–34.0)
MCHC: 34.1 g/dL (ref 30.0–36.0)
MCV: 84 fL (ref 80.0–100.0)
Platelets: 323 10*3/uL (ref 150–400)
RBC: 4.26 MIL/uL (ref 3.87–5.11)
RDW: 15.8 % — ABNORMAL HIGH (ref 11.5–15.5)
WBC: 12.9 10*3/uL — ABNORMAL HIGH (ref 4.0–10.5)
nRBC: 0 % (ref 0.0–0.2)

## 2023-10-28 LAB — COMPREHENSIVE METABOLIC PANEL WITH GFR
ALT: 12 U/L (ref 0–44)
AST: 27 U/L (ref 15–41)
Albumin: 3 g/dL — ABNORMAL LOW (ref 3.5–5.0)
Alkaline Phosphatase: 49 U/L (ref 38–126)
Anion gap: 8 (ref 5–15)
BUN: 9 mg/dL (ref 6–20)
CO2: 25 mmol/L (ref 22–32)
Calcium: 8.2 mg/dL — ABNORMAL LOW (ref 8.9–10.3)
Chloride: 108 mmol/L (ref 98–111)
Creatinine, Ser: 1.28 mg/dL — ABNORMAL HIGH (ref 0.44–1.00)
GFR, Estimated: 56 mL/min — ABNORMAL LOW (ref >60)
Glucose, Bld: 224 mg/dL — ABNORMAL HIGH (ref 70–99)
Potassium: 2.7 mmol/L — CL (ref 3.5–5.1)
Sodium: 141 mmol/L (ref 135–145)
Total Bilirubin: 0.4 mg/dL (ref 0.0–1.2)
Total Protein: 5.3 g/dL — ABNORMAL LOW (ref 6.5–8.1)

## 2023-10-28 LAB — I-STAT ARTERIAL BLOOD GAS, ED
Acid-Base Excess: 2 mmol/L (ref 0.0–2.0)
Bicarbonate: 28.4 mmol/L — ABNORMAL HIGH (ref 20.0–28.0)
Calcium, Ion: 1.16 mmol/L (ref 1.15–1.40)
HCT: 35 % — ABNORMAL LOW (ref 36.0–46.0)
Hemoglobin: 11.9 g/dL — ABNORMAL LOW (ref 12.0–15.0)
O2 Saturation: 100 %
Patient temperature: 97.7
Potassium: 3.3 mmol/L — ABNORMAL LOW (ref 3.5–5.1)
Sodium: 142 mmol/L (ref 135–145)
TCO2: 30 mmol/L (ref 22–32)
pCO2 arterial: 52.4 mmHg — ABNORMAL HIGH (ref 32–48)
pH, Arterial: 7.339 — ABNORMAL LOW (ref 7.35–7.45)
pO2, Arterial: 355 mmHg — ABNORMAL HIGH (ref 83–108)

## 2023-10-28 LAB — BASIC METABOLIC PANEL WITH GFR
Anion gap: 11 (ref 5–15)
BUN: 6 mg/dL (ref 6–20)
CO2: 24 mmol/L (ref 22–32)
Calcium: 8.6 mg/dL — ABNORMAL LOW (ref 8.9–10.3)
Chloride: 107 mmol/L (ref 98–111)
Creatinine, Ser: 1.2 mg/dL — ABNORMAL HIGH (ref 0.44–1.00)
GFR, Estimated: >60 mL/min (ref >60)
Glucose, Bld: 97 mg/dL (ref 70–99)
Potassium: 4.1 mmol/L (ref 3.5–5.1)
Sodium: 142 mmol/L (ref 135–145)

## 2023-10-28 LAB — ETHANOL: Alcohol, Ethyl (B): <10 mg/dL (ref <10)

## 2023-10-28 LAB — GLUCOSE, CAPILLARY
Glucose-Capillary: 103 mg/dL — ABNORMAL HIGH (ref 70–99)
Glucose-Capillary: 86 mg/dL (ref 70–99)
Glucose-Capillary: 100 mg/dL — ABNORMAL HIGH (ref 70–99)

## 2023-10-28 LAB — HIV ANTIBODY (ROUTINE TESTING W REFLEX): HIV Screen 4th Generation wRfx: NONREACTIVE

## 2023-10-28 LAB — PHOSPHORUS: Phosphorus: 3.3 mg/dL (ref 2.5–4.6)

## 2023-10-28 LAB — ACETAMINOPHEN LEVEL: Acetaminophen (Tylenol), Serum: <10 ug/mL — ABNORMAL LOW (ref 10–30)

## 2023-10-28 LAB — SALICYLATE LEVEL: Salicylate Lvl: <7 mg/dL — ABNORMAL LOW (ref 7.0–30.0)

## 2023-10-28 LAB — MAGNESIUM: Magnesium: 2.2 mg/dL (ref 1.7–2.4)

## 2023-10-28 LAB — MRSA NEXT GEN BY PCR, NASAL: MRSA by PCR Next Gen: NOT DETECTED

## 2023-10-28 MED ORDER — POLYETHYLENE GLYCOL 3350 17 G PO PACK: 17.0000 g | PACK | Freq: Every day | ORAL | Status: DC

## 2023-10-28 MED ORDER — ETOMIDATE 2 MG/ML IV SOLN
INTRAVENOUS | Status: AC
  Filled 2023-10-28: qty 20

## 2023-10-28 MED ORDER — HALOPERIDOL LACTATE 5 MG/ML IJ SOLN
2.0000 mg | Freq: Once | INTRAMUSCULAR | Status: AC
  Administered 2023-10-28: 5 mg via INTRAVENOUS
  Filled 2023-10-28: qty 1

## 2023-10-28 MED ORDER — CALCIUM GLUCONATE-NACL 1-0.675 GM/50ML-% IV SOLN
1.0000 g | Freq: Once | INTRAVENOUS | Status: AC
  Administered 2023-10-28: 1000 mg via INTRAVENOUS
  Filled 2023-10-28: qty 50

## 2023-10-28 MED ORDER — OLANZAPINE 5 MG PO TABS
15.0000 mg | ORAL_TABLET | Freq: Every day | ORAL | Status: DC
  Filled 2023-10-28: qty 1

## 2023-10-28 MED ORDER — DEXTROSE IN LACTATED RINGERS 5 % IV SOLN: INTRAVENOUS | Status: DC

## 2023-10-28 MED ORDER — LACTATED RINGERS IV SOLN
INTRAVENOUS | Status: DC
  Administered 2023-10-28: 125 mL/h via INTRAVENOUS

## 2023-10-28 MED ORDER — ROCURONIUM BROMIDE 10 MG/ML (PF) SYRINGE
PREFILLED_SYRINGE | INTRAVENOUS | Status: AC | PRN
  Administered 2023-10-28: 100 mg via INTRAVENOUS

## 2023-10-28 MED ORDER — FAMOTIDINE 20 MG PO TABS
20.0000 mg | ORAL_TABLET | Freq: Two times a day (BID) | ORAL | Status: DC
  Administered 2023-10-28: 20 mg
  Filled 2023-10-28: qty 1

## 2023-10-28 MED ORDER — MIDAZOLAM HCL 2 MG/2ML IJ SOLN
INTRAMUSCULAR | Status: AC
  Filled 2023-10-28: qty 2

## 2023-10-28 MED ORDER — SUCCINYLCHOLINE CHLORIDE 200 MG/10ML IV SOSY
PREFILLED_SYRINGE | INTRAVENOUS | Status: AC
  Filled 2023-10-28: qty 10

## 2023-10-28 MED ORDER — POLYETHYLENE GLYCOL 3350 17 G PO PACK: 17.0000 g | PACK | Freq: Every day | ORAL | Status: DC | PRN

## 2023-10-28 MED ORDER — ONDANSETRON HCL 4 MG/2ML IJ SOLN: 4.0000 mg | Freq: Four times a day (QID) | INTRAMUSCULAR | Status: DC | PRN

## 2023-10-28 MED ORDER — FENTANYL CITRATE PF 50 MCG/ML IJ SOSY
PREFILLED_SYRINGE | INTRAMUSCULAR | Status: AC
  Filled 2023-10-28: qty 2

## 2023-10-28 MED ORDER — MIRTAZAPINE 15 MG PO TABS: 45.0000 mg | ORAL_TABLET | Freq: Every day | ORAL | Status: DC

## 2023-10-28 MED ORDER — DEXMEDETOMIDINE HCL IN NACL 400 MCG/100ML IV SOLN
0.0000 ug/kg/h | INTRAVENOUS | Status: DC
  Administered 2023-10-29: 0.5 ug/kg/h via INTRAVENOUS
  Filled 2023-10-28: qty 100

## 2023-10-28 MED ORDER — LABETALOL HCL 5 MG/ML IV SOLN: 20.0000 mg | INTRAVENOUS | Status: DC | PRN

## 2023-10-28 MED ORDER — PRAZOSIN HCL 1 MG PO CAPS
1.0000 mg | ORAL_CAPSULE | Freq: Every day | ORAL | Status: DC
  Filled 2023-10-28: qty 1

## 2023-10-28 MED ORDER — LORAZEPAM 2 MG/ML IJ SOLN
2.0000 mg | Freq: Once | INTRAMUSCULAR | Status: AC
  Administered 2023-10-28: 2 mg via INTRAVENOUS
  Filled 2023-10-28: qty 1

## 2023-10-28 MED ORDER — ROCURONIUM BROMIDE 10 MG/ML (PF) SYRINGE
PREFILLED_SYRINGE | INTRAVENOUS | Status: AC
  Filled 2023-10-28: qty 10

## 2023-10-28 MED ORDER — KETAMINE HCL 50 MG/5ML IJ SOSY
PREFILLED_SYRINGE | INTRAMUSCULAR | Status: AC
  Filled 2023-10-28: qty 10

## 2023-10-28 MED ORDER — AMLODIPINE BESYLATE 5 MG PO TABS
5.0000 mg | ORAL_TABLET | Freq: Every day | ORAL | Status: DC
  Administered 2023-10-28: 5 mg
  Filled 2023-10-28: qty 1

## 2023-10-28 MED ORDER — PROPOFOL 1000 MG/100ML IV EMUL
5.0000 ug/kg/min | INTRAVENOUS | Status: DC
Start: 2023-10-28 — End: 2023-10-29
  Administered 2023-10-28: 25 ug/kg/min via INTRAVENOUS
  Administered 2023-10-28: 20 ug/kg/min via INTRAVENOUS
  Filled 2023-10-28 (×3): qty 100

## 2023-10-28 MED ORDER — LACTATED RINGERS IV BOLUS
1000.0000 mL | Freq: Once | INTRAVENOUS | Status: AC
  Administered 2023-10-28: 1000 mL via INTRAVENOUS

## 2023-10-28 MED ORDER — AMLODIPINE BESYLATE 10 MG PO TABS
10.0000 mg | ORAL_TABLET | Freq: Every day | ORAL | Status: DC
  Filled 2023-10-28: qty 1

## 2023-10-28 MED ORDER — DEXMEDETOMIDINE HCL IN NACL 400 MCG/100ML IV SOLN
INTRAVENOUS | Status: AC
  Administered 2023-10-29: 0.4 ug/kg/h via INTRAVENOUS
  Filled 2023-10-28: qty 100

## 2023-10-28 MED ORDER — ETOMIDATE 2 MG/ML IV SOLN
INTRAVENOUS | Status: AC | PRN
  Administered 2023-10-28: 20 mg via INTRAVENOUS

## 2023-10-28 MED ORDER — CHLORHEXIDINE GLUCONATE CLOTH 2 % EX PADS: 6.0000 | MEDICATED_PAD | Freq: Every day | CUTANEOUS | Status: DC

## 2023-10-28 MED ORDER — LORAZEPAM 2 MG/ML IJ SOLN
1.0000 mg | Freq: Once | INTRAMUSCULAR | Status: AC
  Administered 2023-10-28: 1 mg via INTRAVENOUS
  Filled 2023-10-28: qty 1

## 2023-10-28 MED ORDER — HYDRALAZINE HCL 20 MG/ML IJ SOLN
20.0000 mg | INTRAMUSCULAR | Status: DC | PRN
  Administered 2023-10-28: 10 mg via INTRAVENOUS
  Filled 2023-10-28: qty 1

## 2023-10-28 MED ORDER — POTASSIUM CHLORIDE 10 MEQ/100ML IV SOLN
10.0000 meq | INTRAVENOUS | Status: AC
  Administered 2023-10-28 (×3): 10 meq via INTRAVENOUS
  Filled 2023-10-28 (×3): qty 100

## 2023-10-28 MED ORDER — FENTANYL CITRATE PF 50 MCG/ML IJ SOSY
50.0000 ug | PREFILLED_SYRINGE | INTRAMUSCULAR | Status: DC | PRN
  Administered 2023-10-28: 100 ug via INTRAVENOUS
  Filled 2023-10-28: qty 2

## 2023-10-28 MED ORDER — MAGNESIUM SULFATE 2 GM/50ML IV SOLN
2.0000 g | Freq: Once | INTRAVENOUS | Status: AC
  Administered 2023-10-28: 2 g via INTRAVENOUS
  Filled 2023-10-28: qty 50

## 2023-10-28 MED ORDER — FENTANYL CITRATE PF 50 MCG/ML IJ SOSY: 50.0000 ug | PREFILLED_SYRINGE | INTRAMUSCULAR | Status: DC | PRN

## 2023-10-28 MED ORDER — DOCUSATE SODIUM 50 MG/5ML PO LIQD: 100.0000 mg | Freq: Two times a day (BID) | ORAL | Status: DC | PRN

## 2023-10-28 MED ORDER — ENOXAPARIN SODIUM 40 MG/0.4ML IJ SOSY
40.0000 mg | PREFILLED_SYRINGE | Freq: Every day | INTRAMUSCULAR | Status: DC
  Administered 2023-10-28: 40 mg via SUBCUTANEOUS
  Filled 2023-10-28 (×2): qty 0.4

## 2023-10-28 NOTE — Progress Notes (Signed)
 Transported pt from 2M03 to CT2 and back with bedside RN and transport. Vitals are stable with no apparent complications.

## 2023-10-28 NOTE — Progress Notes (Signed)
 boyfriend (rex)  brought in bottle of pt mirtazapine 45mg  (remeron) that he picked up for pt yesterday from pharmacy with quantity total 10 pills on rx bottle. Per boyfriend, " Mirtazapine is the new medication and that's what caused her to be altered and confused." This nurse counted the pills in pt prescribed bottle, there was 8 pills instead of 9 pills. Boyfriend informed of the possibility that the pt took 2 pills last night instead of 1 pill at bedtime as prescribed. MD notified of this event.

## 2023-10-28 NOTE — ED Notes (Signed)
 Patient restless in bed; assisted on bedpan; patient slurring words; difficult to interpret speech

## 2023-10-28 NOTE — Procedures (Signed)
 Extubation Procedure Note  Patient Details:   Name: Jessica Walton DOB: 12-Dec-1985 MRN: [id number]   Airway Documentation:    Vent end date: 10/28/23 Vent end time: 2001   Evaluation  O2 sats: stable throughout Complications: No apparent complications Patient did tolerate procedure well. Bilateral Breath Sounds: Diminished   Yes Patient extubated to [name] per order. Patient had a positive cuff leak prior to extubation. Patient A&Ox4 and able to speak clearly. Patient tolerated well.   Ranae Plumber Martita Brumm 10/28/2023, 8:05 PM

## 2023-10-28 NOTE — Progress Notes (Signed)
 Pt transported from ED room 18 to 2M03 without any complications.
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
## 2023-10-28 NOTE — ED Notes (Signed)
 Intubation successful by MD Bero; 24 at the lip; color change; bilateral breath sounds present
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
## 2023-10-28 NOTE — ED Notes (Signed)
 Patient climbing out of the bed; swinging at staff; continues to be altered & hallucinating; attempting to leave; MD Bero at bedside
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
## 2023-10-28 NOTE — ED Notes (Signed)
 Patient becoming increasingly agitated; altered and hallucinating; attempting to get out the bed; concern for fall & injury if ambulates; MD Bero notified; additional medications to be ordered.

## 2023-10-28 NOTE — ED Triage Notes (Signed)
 Patient BIB EMS from home. BF called into 911, saying that she wasn't breathing well after taking her home medications.  EMS arrived, patient had pinpoint pupils, respiratory depression - gave 1.5 mg of Narcan.

## 2023-10-28 NOTE — Plan of Care (Signed)
  Problem: Elimination: Goal: Will not experience complications related to bowel motility Outcome: Progressing   Problem: Nutrition: Goal: Adequate nutrition will be maintained Outcome: Progressing   Problem: Activity: Goal: Risk for activity intolerance will decrease Outcome: Progressing

## 2023-10-29 DIAGNOSIS — G929 Unspecified toxic encephalopathy: Secondary | ICD-10-CM | POA: Diagnosis not present

## 2023-10-29 LAB — BASIC METABOLIC PANEL WITH GFR
Anion gap: 9 (ref 5–15)
BUN: 7 mg/dL (ref 6–20)
CO2: 26 mmol/L (ref 22–32)
Calcium: 8.3 mg/dL — ABNORMAL LOW (ref 8.9–10.3)
Chloride: 106 mmol/L (ref 98–111)
Creatinine, Ser: 1.24 mg/dL — ABNORMAL HIGH (ref 0.44–1.00)
GFR, Estimated: 58 mL/min — ABNORMAL LOW (ref >60)
Glucose, Bld: 94 mg/dL (ref 70–99)
Potassium: 3.7 mmol/L (ref 3.5–5.1)
Sodium: 141 mmol/L (ref 135–145)

## 2023-10-29 LAB — CBC
HCT: 32.5 % — ABNORMAL LOW (ref 36.0–46.0)
Hemoglobin: 10.7 g/dL — ABNORMAL LOW (ref 12.0–15.0)
MCH: 27.9 pg (ref 26.0–34.0)
MCHC: 32.9 g/dL (ref 30.0–36.0)
MCV: 84.6 fL (ref 80.0–100.0)
Platelets: 301 10*3/uL (ref 150–400)
RBC: 3.84 MIL/uL — ABNORMAL LOW (ref 3.87–5.11)
RDW: 16.3 % — ABNORMAL HIGH (ref 11.5–15.5)
WBC: 9.4 10*3/uL (ref 4.0–10.5)
nRBC: 0 % (ref 0.0–0.2)

## 2023-10-29 LAB — GLUCOSE, CAPILLARY
Glucose-Capillary: 105 mg/dL — ABNORMAL HIGH (ref 70–99)
Glucose-Capillary: 127 mg/dL — ABNORMAL HIGH (ref 70–99)
Glucose-Capillary: 129 mg/dL — ABNORMAL HIGH (ref 70–99)

## 2023-10-29 LAB — PHOSPHORUS: Phosphorus: 3 mg/dL (ref 2.5–4.6)

## 2023-10-29 LAB — MAGNESIUM: Magnesium: 1.8 mg/dL (ref 1.7–2.4)

## 2023-10-29 MED ORDER — POLYETHYLENE GLYCOL 3350 17 G PO PACK: 17.0000 g | PACK | Freq: Every day | ORAL | Status: DC

## 2023-10-29 MED ORDER — MIRTAZAPINE 15 MG PO TABS: 45.0000 mg | ORAL_TABLET | Freq: Every day | ORAL | Status: DC

## 2023-10-29 MED ORDER — OLANZAPINE 5 MG PO TABS
15.0000 mg | ORAL_TABLET | Freq: Every day | ORAL | Status: DC
  Filled 2023-10-29: qty 1

## 2023-10-29 MED ORDER — AMLODIPINE BESYLATE 10 MG PO TABS: 10.0000 mg | ORAL_TABLET | Freq: Every day | ORAL | Status: DC

## 2023-10-29 NOTE — Progress Notes (Signed)
 Reviewed AVS, patient expressed understanding of medications, MD follow up reviewed.   Patient states all belongings brought to the hospital at time of admission are accounted for and packed to take home.  Patient getting dressed and will be transported to entrance A where family member was waiting in vehicle to transport home.
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
## 2023-10-29 NOTE — Hospital Course (Addendum)
 Jessica Walton is a 38 y/o female with a Hx of HTN, bipolar disorder, and polysubstance abuse (THC, cocaine, opiate), presented to ED due to abnormal breathing,and admitted for acute metabolic encephalopathy.  Altered mental status Acute metabolic encephalopathy. On presentation she was agitated, altered, and intubated for airway protection.  Mild leukocytosis, otherwise afebrile.  Other labs including acetaminophen, salicylate, and alcohol were all normal.  UDS negative.  No focal deficit on exam, CT of the head unremarkable.  She was extubated after 24 hours.  She endorsed taking 4 doses of mirtazapine due to feeling overly anxious, and stressors in her life. -Advised patient to follow-up with PCP - Cw Mirtazapine 45 once a day @ bedtime. - Zyprexa 15 mg.  - prazosin for nightmares.   AKI -Improved with IV fluids.  Hypertension -Cw amlodipine 5 mg.

## 2023-10-30 ENCOUNTER — Telehealth (HOSPITAL_COMMUNITY): Payer: Self-pay

## 2023-10-30 NOTE — Telephone Encounter (Signed)
 Pt is requesting for a refill of Mirtazapine 45 MG to be sent to pharmacy.     JNL

## 2023-11-16 ENCOUNTER — Other Ambulatory Visit (HOSPITAL_COMMUNITY): Payer: Self-pay | Admitting: Physician Assistant

## 2023-11-16 DIAGNOSIS — F411 Generalized anxiety disorder: Secondary | ICD-10-CM

## 2023-11-16 DIAGNOSIS — F3132 Bipolar disorder, current episode depressed, moderate: Secondary | ICD-10-CM

## 2023-11-16 DIAGNOSIS — F4310 Post-traumatic stress disorder, unspecified: Secondary | ICD-10-CM

## 2023-11-16 MED ORDER — OLANZAPINE 5 MG PO TABS: 5.0000 mg | ORAL_TABLET | Freq: Every day | ORAL | 0 refills | Status: DC

## 2023-11-16 MED ORDER — PRAZOSIN HCL 1 MG PO CAPS: 1.0000 mg | ORAL_CAPSULE | Freq: Every day | ORAL | 0 refills | Status: DC

## 2023-11-16 MED ORDER — MIRTAZAPINE 15 MG PO TABS: 15.0000 mg | ORAL_TABLET | Freq: Every day | ORAL | 0 refills | Status: DC

## 2023-11-16 NOTE — Progress Notes (Signed)
 Provider was able to reach out to patient regarding concerns over her medication regimen.  Patient informed provider that she did not overdose on her current medication regimen.  She reports that she had used an old prescription of Seroquel  and Tylenol  PM to help with her sleep which made it look like she had overdosed.  Patient reiterates that she did not overdose on her psychiatric medications.  Patient is requesting to be placed back on her psychiatric medications.  She reports that she lost her current medications 2 weeks ago and has run out.  Since being without her medications, patient reports that she has been down and depressed.  Due to patient being without her medications for roughly 2 weeks, provider to start patient on a low dose of her medications.  Patient vocalized understanding.  Patient to be prescribed the following psychiatric medications:  Mirtazapine  15 mg at bedtime Prazosin  1 mg at bedtime Olanzapine  5 mg at bedtime  Patient's medications to be e-prescribed to pharmacy of choice.

## 2023-11-23 ENCOUNTER — Other Ambulatory Visit (HOSPITAL_COMMUNITY): Payer: Self-pay | Admitting: Physician Assistant

## 2023-11-23 ENCOUNTER — Encounter (HOSPITAL_COMMUNITY): Payer: MEDICAID | Admitting: Physician Assistant

## 2023-11-23 DIAGNOSIS — F4310 Post-traumatic stress disorder, unspecified: Secondary | ICD-10-CM

## 2023-11-23 DIAGNOSIS — F411 Generalized anxiety disorder: Secondary | ICD-10-CM

## 2023-11-23 DIAGNOSIS — F3132 Bipolar disorder, current episode depressed, moderate: Secondary | ICD-10-CM

## 2023-11-29 NOTE — Telephone Encounter (Signed)
 Message acknowledged and reviewed.

## 2023-12-06 ENCOUNTER — Other Ambulatory Visit (HOSPITAL_COMMUNITY): Payer: Self-pay | Admitting: Physician Assistant

## 2023-12-06 ENCOUNTER — Telehealth (HOSPITAL_COMMUNITY): Payer: Self-pay | Admitting: *Deleted

## 2023-12-06 DIAGNOSIS — F4310 Post-traumatic stress disorder, unspecified: Secondary | ICD-10-CM

## 2023-12-06 DIAGNOSIS — F411 Generalized anxiety disorder: Secondary | ICD-10-CM

## 2023-12-06 DIAGNOSIS — F3132 Bipolar disorder, current episode depressed, moderate: Secondary | ICD-10-CM

## 2023-12-06 MED ORDER — PRAZOSIN HCL 1 MG PO CAPS
1.0000 mg | ORAL_CAPSULE | Freq: Every day | ORAL | 0 refills | Status: DC
Start: 2023-12-06 — End: 2023-12-13

## 2023-12-06 MED ORDER — OLANZAPINE 5 MG PO TABS: 5.0000 mg | ORAL_TABLET | Freq: Every day | ORAL | 0 refills | Status: DC

## 2023-12-06 MED ORDER — MIRTAZAPINE 15 MG PO TABS: 15.0000 mg | ORAL_TABLET | Freq: Every day | ORAL | 0 refills | Status: DC

## 2023-12-06 NOTE — Telephone Encounter (Signed)
 Message acknowledged and reviewed. Patient to be prescribed a week supply of her medications. Patient was instructed to follow up as a walk-in due to her most recent no-show.

## 2023-12-06 NOTE — Progress Notes (Signed)
 Provider was contacted by Madeleine Schaffer, RN regarding patient's request for her medication to be refilled.  Provider spoke with patient yesterday and informed her that due to her most recent no-show, she would only be prescribed a weeks worth of her medication.  Patient vocalized understanding.  Patient was instructed to come and has a walk-in to reestablish care.  Patient's medications to be e-prescribed to pharmacy of choice.

## 2023-12-06 NOTE — Telephone Encounter (Signed)
 Patient called stating that she was suppose to get a weeks supply of her medication sent in and her pharmacy does not have it yet.  Asking that her provider give her a call.

## 2023-12-13 ENCOUNTER — Telehealth (HOSPITAL_COMMUNITY): Payer: Self-pay

## 2023-12-13 ENCOUNTER — Encounter (HOSPITAL_COMMUNITY): Payer: Self-pay | Admitting: Physician Assistant

## 2023-12-13 ENCOUNTER — Ambulatory Visit (INDEPENDENT_AMBULATORY_CARE_PROVIDER_SITE_OTHER): Admitting: Physician Assistant

## 2023-12-13 ENCOUNTER — Other Ambulatory Visit (HOSPITAL_COMMUNITY): Payer: Self-pay | Admitting: Physician Assistant

## 2023-12-13 VITALS — BP 139/86 | HR 84 | Temp 98.3°F | Ht 69.0 in | Wt 222.4 lb

## 2023-12-13 DIAGNOSIS — F4310 Post-traumatic stress disorder, unspecified: Secondary | ICD-10-CM

## 2023-12-13 DIAGNOSIS — F411 Generalized anxiety disorder: Secondary | ICD-10-CM | POA: Diagnosis not present

## 2023-12-13 DIAGNOSIS — F3132 Bipolar disorder, current episode depressed, moderate: Secondary | ICD-10-CM | POA: Diagnosis not present

## 2023-12-13 DIAGNOSIS — Z79899 Other long term (current) drug therapy: Secondary | ICD-10-CM | POA: Diagnosis not present

## 2023-12-13 MED ORDER — OLANZAPINE 10 MG PO TABS: 10.0000 mg | ORAL_TABLET | Freq: Every day | ORAL | 1 refills | Status: DC

## 2023-12-13 MED ORDER — PRAZOSIN HCL 1 MG PO CAPS: 1.0000 mg | ORAL_CAPSULE | Freq: Every day | ORAL | 1 refills | Status: DC

## 2023-12-13 MED ORDER — MIRTAZAPINE 30 MG PO TABS: 30.0000 mg | ORAL_TABLET | Freq: Every day | ORAL | 1 refills | Status: AC
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
## 2023-12-13 NOTE — Telephone Encounter (Signed)
 Did we by chance get a prior authorization from her pharmacy regarding her medications?

## 2023-12-13 NOTE — Telephone Encounter (Signed)
 Hello, Pt is currently at pharmacy trying to pick up meds stating that her Olanzapine  and minipress  is not ready for pick up, is there anyway that you could rend scripts for refill ?

## 2023-12-13 NOTE — Telephone Encounter (Signed)
 Pt just called back and states she would like a phone call is possible.   JNL
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
## 2024-01-25 ENCOUNTER — Encounter (HOSPITAL_COMMUNITY): Admitting: Psychiatry

## 2024-05-18 ENCOUNTER — Other Ambulatory Visit (HOSPITAL_COMMUNITY): Payer: Self-pay | Admitting: Physician Assistant

## 2024-05-18 DIAGNOSIS — F3132 Bipolar disorder, current episode depressed, moderate: Secondary | ICD-10-CM

## 2024-06-16 ENCOUNTER — Other Ambulatory Visit: Payer: Self-pay

## 2024-06-16 ENCOUNTER — Emergency Department (HOSPITAL_COMMUNITY)
Admission: EM | Admit: 2024-06-16 | Discharge: 2024-06-16 | Disposition: A | Discharge status: Home / Self Care | Payer: MEDICAID

## 2024-06-16 ENCOUNTER — Ambulatory Visit (HOSPITAL_COMMUNITY)
Admission: EM | Admit: 2024-06-16 | Discharge: 2024-06-16 | Disposition: A | Discharge status: Home / Self Care | Payer: MEDICAID

## 2024-06-16 ENCOUNTER — Encounter (HOSPITAL_COMMUNITY): Payer: Self-pay

## 2024-06-16 ENCOUNTER — Encounter (HOSPITAL_COMMUNITY): Payer: Self-pay | Admitting: Emergency Medicine

## 2024-06-16 DIAGNOSIS — E860 Dehydration: Secondary | ICD-10-CM | POA: Insufficient documentation

## 2024-06-16 DIAGNOSIS — I10 Essential (primary) hypertension: Secondary | ICD-10-CM | POA: Insufficient documentation

## 2024-06-16 DIAGNOSIS — I959 Hypotension, unspecified: Secondary | ICD-10-CM | POA: Diagnosis not present

## 2024-06-16 DIAGNOSIS — R519 Headache, unspecified: Secondary | ICD-10-CM | POA: Diagnosis present

## 2024-06-16 LAB — COMPREHENSIVE METABOLIC PANEL WITH GFR
ALT: 14 U/L (ref 0–44)
AST: 14 U/L — ABNORMAL LOW (ref 15–41)
Albumin: 3.4 g/dL — ABNORMAL LOW (ref 3.5–5.0)
Alkaline Phosphatase: 61 U/L (ref 38–126)
Anion gap: 11 (ref 5–15)
BUN: 10 mg/dL (ref 6–20)
CO2: 24 mmol/L (ref 22–32)
Calcium: 8.5 mg/dL — ABNORMAL LOW (ref 8.9–10.3)
Chloride: 98 mmol/L (ref 98–111)
Creatinine, Ser: 1.37 mg/dL — ABNORMAL HIGH (ref 0.44–1.00)
GFR, Estimated: 51 mL/min — ABNORMAL LOW (ref >60)
Glucose, Bld: 76 mg/dL (ref 70–99)
Potassium: 3.7 mmol/L (ref 3.5–5.1)
Sodium: 133 mmol/L — ABNORMAL LOW (ref 135–145)
Total Bilirubin: 0.4 mg/dL (ref 0.0–1.2)
Total Protein: 6.9 g/dL (ref 6.5–8.1)

## 2024-06-16 LAB — CBC
HCT: 40.5 % (ref 36.0–46.0)
Hemoglobin: 13.2 g/dL (ref 12.0–15.0)
MCH: 27.9 pg (ref 26.0–34.0)
MCHC: 32.6 g/dL (ref 30.0–36.0)
MCV: 85.6 fL (ref 80.0–100.0)
Platelets: 336 K/uL (ref 150–400)
RBC: 4.73 MIL/uL (ref 3.87–5.11)
RDW: 14.6 % (ref 11.5–15.5)
WBC: 6.9 K/uL (ref 4.0–10.5)
nRBC: 0 % (ref 0.0–0.2)

## 2024-06-16 LAB — HCG, SERUM, QUALITATIVE: Preg, Serum: NEGATIVE

## 2024-06-16 LAB — CBG MONITORING, ED: Glucose-Capillary: 109 mg/dL — ABNORMAL HIGH (ref 70–99)

## 2024-06-16 MED ORDER — KETOROLAC TROMETHAMINE 15 MG/ML IJ SOLN
15.0000 mg | Freq: Once | INTRAMUSCULAR | Status: AC
  Administered 2024-06-16: 15 mg via INTRAVENOUS
  Filled 2024-06-16: qty 1

## 2024-06-16 MED ORDER — ONDANSETRON HCL 4 MG/2ML IJ SOLN
4.0000 mg | Freq: Once | INTRAMUSCULAR | Status: AC
  Administered 2024-06-16: 4 mg via INTRAVENOUS
  Filled 2024-06-16: qty 2

## 2024-06-16 MED ORDER — LACTATED RINGERS IV BOLUS
1000.0000 mL | Freq: Once | INTRAVENOUS | Status: AC
  Administered 2024-06-16: 1000 mL via INTRAVENOUS

## 2024-06-16 MED ORDER — ONDANSETRON 4 MG PO TBDP: 4.0000 mg | ORAL_TABLET | Freq: Three times a day (TID) | ORAL | 0 refills | Status: AC | PRN

## 2024-06-16 NOTE — ED Provider Triage Note (Signed)
 Emergency Medicine Provider Triage Evaluation Note  Jessica Walton , a 39 y.o. female  was evaluated in triage.  Pt complains of weak. For the past week pt has had lightheadedness, dizziness.  No cp, sob, abd pain, back pain, dysuria, blood in vomit or in urine, no rectal bleeding.  No changes in medication.  LMP a month ago.  Seen at [facility] today and found to be hypotensive.  Review of Systems  Positive: As above Negative: As above  Physical Exam  BP 94/66   Pulse 69   Temp 97.7 F (36.5 C)   Resp 14   LMP 05/16/2024 (Exact Date)   SpO2 100%  Gen:   Awake, no distress   Resp:  Normal effort  MSK:   Moves extremities without difficulty  Other:    Medical Decision Making  Medically screening exam initiated at 7:25 PM.  Appropriate orders placed.  Jessica Walton was informed that the remainder of the evaluation will be completed by another provider, this initial triage assessment does not replace that evaluation, and the importance of remaining in the ED until their evaluation is complete.     [name], PA-C 06/16/24 1927

## 2024-06-16 NOTE — Discharge Instructions (Signed)
 Your workup today was reassuring.  Please take Zofran  as needed for nausea.  Drink plenty of fluids and follow-up with your doctor.  Return to the ER for worsening symptoms.

## 2024-06-16 NOTE — ED Provider Notes (Signed)
 [facility]    CSN: [id number] Arrival date & time: 06/16/24  1556      History   Chief Complaint Chief Complaint  Patient presents with   Sore Throat    HPI Jessica Walton is a 38 y.o. female.   Jessica Walton is a 38 y.o. female presenting for chief complaint of sore throat, postural dizziness, generalized weakness, generalized fatigue, and bodyaches that started 1 week ago.  Her partner is sick with similar symptoms.  Sore throat is worsened with swallowing.  She becomes dizzy and lightheaded whenever she changes positions.  History of opioid dependence, tetrahydrocannabinol dependence, and cocaine abuse.  She denies recent illicit drug use today.  States she has been off of her psychiatric medications for the last few weeks with abrupt cessation due to need for new prescription from her mental health provider.  She questions if this could be causing some of her lower blood pressure readings.  History of hypertension, states she takes hydrochlorothiazide  as needed.  She has not taken HCTZ recently. She is eating and drinking normally.  Reports intermittent nausea without vomiting or diarrhea.  Denies fever/chills, chest pain, heart palpitations, cough, congestion, and shortness of breath.    Sore Throat    Past Medical History:  Diagnosis Date   Asthma    No inhaler use x 1 yr (2013)   Bipolar 1 disorder (HCC)    Cocaine abuse (HCC) 2018   Headache    History of suicide attempt 2015   Hypertension    Lordosis    Opiate abuse (HCC) 2018   Opioid use disorder, severe, dependence (HCC) 07/25/2016   PTSD (post-traumatic stress disorder)    Seizures (HCC)    Tetrahydrocannabinol (THC) dependence (HCC)     Patient Active Problem List   Diagnosis Date Noted   Acute encephalopathy 10/28/2023   T wave inversion in EKG 07/09/2023   Heartburn 07/09/2023   Antipsychotic overdose 07/08/2023   AKI (acute kidney injury) 07/07/2023   Overdose of antipsychotic  07/07/2023   Schizophrenia (HCC) 07/07/2023   Lactic acidosis 07/07/2023   Tachycardia 07/07/2023   Bipolar affective disorder, currently depressed, moderate (HCC) 09/23/2022   Insomnia 09/23/2022   Generalized anxiety disorder 09/23/2022   BV (bacterial vaginosis) 04/06/2022   Substance use disorder 04/01/2022   Incompetency, cervical 10/07/2017   Inevitable abortion 10/06/2017   Cervical incompetence, antepartum 10/06/2017   Tetrahydrocannabinol (THC) dependence (HCC)    Seizures (HCC)    PTSD (post-traumatic stress disorder)    Opioid use disorder, severe, dependence (HCC) 07/25/2016   Cocaine abuse (HCC) 07/25/2016   Seizure, convulsion (HCC) 04/30/2015   History of suicide attempt 07/25/2013   Supervision of high-risk pregnancy 07/03/2013   History of prior pregnancy with short cervix, currently pregnant in second trimester 07/03/2013    Past Surgical History:  Procedure Laterality Date   EYE SURGERY     WISDOM TOOTH EXTRACTION     x4    OB History     Gravida  5   Para  3   Term  1   Preterm  1   AB  2   Living  1      SAB  1   IAB  1   Ectopic  0   Multiple  0   Live Births  1            Home Medications    Prior to Admission medications   Medication Sig Start Date End Date  Taking? Authorizing Provider  amLODipine  (NORVASC ) 5 MG tablet Take 1 tablet (5 mg total) by mouth daily. Patient not taking: Reported on 10/28/2023 08/29/23 11/27/23  Joesph Shaver Scales, PA-C  hydrochlorothiazide  (HYDRODIURIL ) 12.5 MG tablet Take 1 tablet (12.5 mg total) by mouth in the morning. Patient not taking: Reported on 10/28/2023 08/29/23 11/27/23  Joesph Shaver Scales, PA-C  mirtazapine  (REMERON ) 30 MG tablet Take 1 tablet (30 mg total) by mouth at bedtime. 12/13/23   Nwoko, Reginia BRAVO, PA  OLANZapine  (ZYPREXA ) 10 MG tablet TAKE 1 TABLET BY MOUTH EVERYDAY AT BEDTIME 12/13/23   Nwoko, Reginia BRAVO, PA    Family History Family History  Problem Relation Age of Onset    Hypertension Maternal Grandmother    Diabetes Maternal Grandmother    Seizures Neg Hx     Social History Social History   Tobacco Use   Smoking status: Former    Current packs/day: 0.50    Average packs/day: 0.5 packs/day for 10.0 years (5.0 ttl pk-yrs)    Types: Cigarettes   Smokeless tobacco: Never  Vaping Use   Vaping status: Every Day  Substance Use Topics   Alcohol  use: Not Currently    Comment: occasional   Drug use: No     Allergies   Benadryl  [diphenhydramine  hcl] and Vicodin [hydrocodone -acetaminophen ]   Review of Systems Review of Systems Per HPI  Physical Exam Triage Vital Signs ED Triage Vitals [06/16/24 1725]  Encounter Vitals Group     BP (!) 87/67     Girls Systolic BP Percentile      Girls Diastolic BP Percentile      Boys Systolic BP Percentile      Boys Diastolic BP Percentile      Pulse Rate 67     Resp 16     Temp 98.3 F (36.8 C)     Temp Source Oral     SpO2 100 %     Weight      Height      Head Circumference      Peak Flow      Pain Score 9     Pain Loc      Pain Education      Exclude from Growth Chart    No data found.  Updated Vital Signs BP (!) 87/67 (BP Location: Left Arm)   Pulse 67   Temp 98.3 F (36.8 C) (Oral)   Resp 16   LMP 05/16/2024 (Exact Date)   SpO2 100%   Visual Acuity Right Eye Distance:   Left Eye Distance:   Bilateral Distance:    Right Eye Near:   Left Eye Near:    Bilateral Near:     Physical Exam Vitals and nursing note reviewed.  Constitutional:      Appearance: She is not ill-appearing or toxic-appearing.  HENT:     Head: Normocephalic and atraumatic.     Right Ear: Hearing, tympanic membrane, ear canal and external ear normal.     Left Ear: Hearing, tympanic membrane, ear canal and external ear normal.     Nose: Nose normal.     Mouth/Throat:     Lips: Pink.     Mouth: Mucous membranes are moist. No injury or oral lesions.     Dentition: Normal dentition.     Tongue: No lesions.      Pharynx: Oropharynx is clear. Uvula midline. No pharyngeal swelling, oropharyngeal exudate, posterior oropharyngeal erythema, uvula swelling or postnasal drip.     Tonsils: No tonsillar  exudate.  Eyes:     General: Lids are normal. Vision grossly intact. Gaze aligned appropriately.     Extraocular Movements: Extraocular movements intact.     Conjunctiva/sclera: Conjunctivae normal.  Neck:     Trachea: Trachea and phonation normal.  Cardiovascular:     Rate and Rhythm: Normal rate and regular rhythm.     Heart sounds: Normal heart sounds, S1 normal and S2 normal.  Pulmonary:     Effort: Pulmonary effort is normal. No respiratory distress.     Breath sounds: Normal breath sounds and air entry. No wheezing, rhonchi or rales.  Chest:     Chest wall: No tenderness.  Musculoskeletal:     Cervical back: Neck supple.  Lymphadenopathy:     Cervical: No cervical adenopathy.  Skin:    General: Skin is warm and dry.     Capillary Refill: Capillary refill takes less than 2 seconds.     Findings: No rash.  Neurological:     General: No focal deficit present.     Mental Status: She is alert and oriented to person, place, and time. Mental status is at baseline.     Cranial Nerves: No dysarthria or facial asymmetry.  Psychiatric:        Mood and Affect: Mood normal.        Speech: Speech normal.        Behavior: Behavior normal.        Thought Content: Thought content normal.        Judgment: Judgment normal.      UC Treatments / Results  Labs (all labs ordered are listed, but only abnormal results are displayed) Labs Reviewed - No data to display  EKG   Radiology No results found.  Procedures Procedures (including critical care time)  Medications Ordered in UC Medications - No data to display  Initial Impression / Assessment and Plan / UC Course  I have reviewed the triage vital signs and the nursing notes.  Pertinent labs & imaging results that were available during my  care of the patient were reviewed by me and considered in my medical decision making (see chart for details).   1.  Hypotension Blood pressure on intake/triage was 87/67.  On recheck after ambulation to urgent care room BP is 72/53 while sitting and 70/50 upon standing.  Heart rate stable at 85 bpm.  Patient is overall well-appearing without clear cause of hypotension. Recommend further workup and evaluation in the emergency department setting.  Blood pressures normally run 110s over 70s per chart review. She is ambulatory with steady gait and neurologically intact to baseline. Offered ambulance transport to the ER, patient declines stating she will ride with her partner. We discussed risks of deferring ER visit, patient expresses understanding and agreement with plan.  Discharged from urgent care in stable condition.  Final Clinical Impressions(s) / UC Diagnoses   Final diagnoses:  Hypotension, unspecified hypotension type   Discharge Instructions   None    ED Prescriptions   None    PDMP not reviewed this encounter.   Enedelia Dorna HERO, [place] 06/16/24 [phone]

## 2024-06-16 NOTE — ED Notes (Signed)
 Patient walked, and gait was stable. Pt stated she does not feel sob, or dizzy. Nose is still stuffy.

## 2024-06-16 NOTE — ED Triage Notes (Signed)
 Patient here today with c/o ST, lightheadedness, high blood pressure, dry mouth, body aches, and chills X 1 week. Patient denies taking anything for symptoms.

## 2024-06-16 NOTE — ED Triage Notes (Signed)
 Pt referred to ED from UC. She reports for the last week she's been having dizziness and feeling faint. At [facility] today she had multiple BP readings at 70's/50's. Pt denies blood loss. Hx of HTN, not currently on medications.

## 2024-06-16 NOTE — ED Provider Notes (Signed)
 [place] EMERGENCY DEPARTMENT AT [facility] Provider Note   CSN: [id number] Arrival date & time: 06/16/24  8145     Patient presents with: Hypotension and Dizziness   Jessica Walton is a 38 y.o. female.   38 year old female with remote history of polysubstance abuse as well as bipolar disorder and hypertension in the past presenting to the emergency department today with concern for hypotension.  The patient went to urgent care initially today for some decreased appetite and diarrhea as well as some lightheadedness that is been going on over the past few days.  The patient also reports that she has been having a global throbbing headache consistent with previous migraine headaches for the past week or so.  She reports she has had decreased oral intake secondary to this.  Denies any fevers.  Denies any neck stiffness.  She came to the ER today after being sent here from urgent care due to the hypotension.  She denies any chest pain or shortness of breath.  Denies any history of DVT or pulmonary embolism, recent surgeries, recent travel.  She denies any room spinning dizziness and states that this is more of a lightheaded feeling.   Dizziness      Prior to Admission medications   Medication Sig Start Date End Date Taking? Authorizing Provider  ondansetron  (ZOFRAN -ODT) 4 MG disintegrating tablet Take 1 tablet (4 mg total) by mouth every 8 (eight) hours as needed for nausea or vomiting. 06/16/24  Yes Ula Prentice SAUNDERS, MD  amLODipine  (NORVASC ) 5 MG tablet Take 1 tablet (5 mg total) by mouth daily. Patient not taking: Reported on 10/28/2023 08/29/23 11/27/23  Joesph Shaver Scales, PA-C  hydrochlorothiazide  (HYDRODIURIL ) 12.5 MG tablet Take 1 tablet (12.5 mg total) by mouth in the morning. Patient not taking: Reported on 10/28/2023 08/29/23 11/27/23  Joesph Shaver Scales, PA-C  mirtazapine  (REMERON ) 30 MG tablet Take 1 tablet (30 mg total) by mouth at bedtime. 12/13/23   Nwoko, Uchenna E, PA   OLANZapine  (ZYPREXA ) 10 MG tablet TAKE 1 TABLET BY MOUTH EVERYDAY AT BEDTIME 12/13/23   Nwoko, Uchenna E, PA    Allergies: Benadryl  [diphenhydramine  hcl] and Vicodin [hydrocodone -acetaminophen ]    Review of Systems  Neurological:  Positive for light-headedness. Negative for dizziness.  All other systems reviewed and are negative.   Updated Vital Signs BP 105/70   Pulse (!) 59   Temp 97.7 F (36.5 C)   Resp 17   LMP 05/16/2024 (Exact Date)   SpO2 100%   Physical Exam Vitals and nursing note reviewed.   Gen: NAD Eyes: PERRL, EOMI HEENT: no oropharyngeal swelling Neck: trachea midline, no meningismus Resp: clear to auscultation bilaterally Card: RRR, no murmurs, rubs, or gallops Abd: nontender, nondistended Extremities: no calf tenderness, no edema Vascular: 2+ radial pulses bilaterally, 2+ DP pulses bilaterally Skin: no rashes Neuro: no focal deficits Psyc: acting appropriately   (all labs ordered are listed, but only abnormal results are displayed) Labs Reviewed  COMPREHENSIVE METABOLIC PANEL WITH GFR - Abnormal; Notable for the following components:      Result Value   Sodium 133 (*)    Creatinine, Ser 1.37 (*)    Calcium  8.5 (*)    Albumin 3.4 (*)    AST 14 (*)    GFR, Estimated 51 (*)    All other components within normal limits  CBG MONITORING, ED - Abnormal; Notable for the following components:   Glucose-Capillary 109 (*)    All other components within normal limits  CBC  HCG, SERUM, QUALITATIVE  URINALYSIS, ROUTINE W REFLEX MICROSCOPIC    EKG: None  Radiology: No results found.   Procedures   Medications Ordered in the ED  lactated ringers  bolus 1,000 mL (0 mLs Intravenous Stopped 06/16/24 2212)  ketorolac  (TORADOL ) 15 MG/ML injection 15 mg (15 mg Intravenous Given 06/16/24 2108)  ondansetron  (ZOFRAN ) injection 4 mg (4 mg Intravenous Given 06/16/24 2106)                                    Medical Decision Making 38 year old female with  past medical history of hypertension who is currently not taking any medications for this for the bipolar disorder presenting to the emergency department today with lightheadedness and hypotension.  The patient's blood pressures here are actually relatively normal in the high 90s systolic.  I will further evaluate her here with basic labs to evaluate for anemia or electrolyte abnormalities.  Will obtain hCG to evaluate for intrauterine versus ectopic pregnancy.  Will give the patient IV fluids here.  The patient does report history of migraine headaches and is is reporting a headache similar to previous.  Will give the patient Toradol  for this as well as Zofran  for her nausea.  Her abdominal exam is reassuring and she is denying any abdominal pain.  Suspect this may be due to a gastroenteritis given the nausea and loose stools.  She does not have any findings on exam consistent with meningitis at this time.  I will reevaluate for ultimate disposition.  The patient's work appears reassuring.  Her blood pressure improved to greater than 100 systolic on reassessment.  She is feeling much better on reassessment.  I think that she is stable for discharge.  She is amatory here with a steady gait with no lightheadedness.  Risk Prescription drug management.        Final diagnoses:  Dehydration    ED Discharge Orders          Ordered    ondansetron  (ZOFRAN -ODT) 4 MG disintegrating tablet  Every 8 hours PRN        06/16/24 2239               Ula Prentice SAUNDERS, MD 06/16/24 2240

## 2024-06-23 ENCOUNTER — Observation Stay (HOSPITAL_COMMUNITY)
Admission: EM | Admit: 2024-06-23 | Discharge: 2024-06-24 | Payer: MEDICAID | Attending: Family Medicine | Admitting: Family Medicine

## 2024-06-23 ENCOUNTER — Encounter (HOSPITAL_COMMUNITY): Payer: Self-pay

## 2024-06-23 ENCOUNTER — Emergency Department (HOSPITAL_COMMUNITY): Payer: MEDICAID

## 2024-06-23 ENCOUNTER — Other Ambulatory Visit: Payer: Self-pay

## 2024-06-23 DIAGNOSIS — J45909 Unspecified asthma, uncomplicated: Secondary | ICD-10-CM | POA: Insufficient documentation

## 2024-06-23 DIAGNOSIS — E860 Dehydration: Secondary | ICD-10-CM | POA: Diagnosis not present

## 2024-06-23 DIAGNOSIS — F319 Bipolar disorder, unspecified: Secondary | ICD-10-CM | POA: Insufficient documentation

## 2024-06-23 DIAGNOSIS — G40909 Epilepsy, unspecified, not intractable, without status epilepticus: Secondary | ICD-10-CM | POA: Diagnosis not present

## 2024-06-23 DIAGNOSIS — Z79899 Other long term (current) drug therapy: Secondary | ICD-10-CM | POA: Diagnosis not present

## 2024-06-23 DIAGNOSIS — I959 Hypotension, unspecified: Principal | ICD-10-CM

## 2024-06-23 DIAGNOSIS — N182 Chronic kidney disease, stage 2 (mild): Secondary | ICD-10-CM

## 2024-06-23 DIAGNOSIS — J069 Acute upper respiratory infection, unspecified: Secondary | ICD-10-CM

## 2024-06-23 DIAGNOSIS — N1831 Chronic kidney disease, stage 3a: Secondary | ICD-10-CM | POA: Insufficient documentation

## 2024-06-23 DIAGNOSIS — F4310 Post-traumatic stress disorder, unspecified: Secondary | ICD-10-CM | POA: Insufficient documentation

## 2024-06-23 DIAGNOSIS — R42 Dizziness and giddiness: Secondary | ICD-10-CM | POA: Diagnosis present

## 2024-06-23 DIAGNOSIS — I129 Hypertensive chronic kidney disease with stage 1 through stage 4 chronic kidney disease, or unspecified chronic kidney disease: Secondary | ICD-10-CM | POA: Insufficient documentation

## 2024-06-23 DIAGNOSIS — I951 Orthostatic hypotension: Principal | ICD-10-CM | POA: Diagnosis present

## 2024-06-23 DIAGNOSIS — Z87891 Personal history of nicotine dependence: Secondary | ICD-10-CM | POA: Insufficient documentation

## 2024-06-23 DIAGNOSIS — F1910 Other psychoactive substance abuse, uncomplicated: Secondary | ICD-10-CM | POA: Insufficient documentation

## 2024-06-23 LAB — URINALYSIS, ROUTINE W REFLEX MICROSCOPIC
Glucose, UA: NEGATIVE mg/dL
Hgb urine dipstick: NEGATIVE
Ketones, ur: NEGATIVE mg/dL
Nitrite: NEGATIVE
Protein, ur: NEGATIVE mg/dL
Specific Gravity, Urine: >1.03 — ABNORMAL HIGH (ref 1.005–1.030)
pH: 6 (ref 5.0–8.0)

## 2024-06-23 LAB — RESP PANEL BY RT-PCR (RSV, FLU A&B, COVID)  RVPGX2
Influenza A by PCR: NEGATIVE
Influenza B by PCR: NEGATIVE
Resp Syncytial Virus by PCR: NEGATIVE
SARS Coronavirus 2 by RT PCR: NEGATIVE

## 2024-06-23 LAB — TROPONIN I (HIGH SENSITIVITY): Troponin I (High Sensitivity): 6 ng/L (ref <18)

## 2024-06-23 LAB — GROUP A STREP BY PCR: Group A Strep by PCR: NOT DETECTED

## 2024-06-23 LAB — CBC
HCT: 38.8 % (ref 36.0–46.0)
Hemoglobin: 12.8 g/dL (ref 12.0–15.0)
MCH: 28.1 pg (ref 26.0–34.0)
MCHC: 33 g/dL (ref 30.0–36.0)
MCV: 85.1 fL (ref 80.0–100.0)
Platelets: 361 K/uL (ref 150–400)
RBC: 4.56 MIL/uL (ref 3.87–5.11)
RDW: 14.6 % (ref 11.5–15.5)
WBC: 7.3 K/uL (ref 4.0–10.5)
nRBC: 0 % (ref 0.0–0.2)

## 2024-06-23 LAB — BASIC METABOLIC PANEL WITH GFR
Anion gap: 11 (ref 5–15)
BUN: 12 mg/dL (ref 6–20)
CO2: 24 mmol/L (ref 22–32)
Calcium: 8.6 mg/dL — ABNORMAL LOW (ref 8.9–10.3)
Chloride: 101 mmol/L (ref 98–111)
Creatinine, Ser: 1.29 mg/dL — ABNORMAL HIGH (ref 0.44–1.00)
GFR, Estimated: 55 mL/min — ABNORMAL LOW (ref >60)
Glucose, Bld: 101 mg/dL — ABNORMAL HIGH (ref 70–99)
Potassium: 4.2 mmol/L (ref 3.5–5.1)
Sodium: 136 mmol/L (ref 135–145)

## 2024-06-23 LAB — URINALYSIS, MICROSCOPIC (REFLEX)

## 2024-06-23 LAB — RAPID URINE DRUG SCREEN, HOSP PERFORMED
Amphetamines: NOT DETECTED
Barbiturates: NOT DETECTED
Benzodiazepines: NOT DETECTED
Cocaine: POSITIVE — AB
Opiates: NOT DETECTED
Tetrahydrocannabinol: NOT DETECTED

## 2024-06-23 LAB — PREGNANCY, URINE: Preg Test, Ur: NEGATIVE

## 2024-06-23 MED ORDER — SODIUM CHLORIDE 0.9 % IV BOLUS
1000.0000 mL | Freq: Once | INTRAVENOUS | Status: AC
  Administered 2024-06-23: 1000 mL via INTRAVENOUS

## 2024-06-23 MED ORDER — ALBUTEROL SULFATE (2.5 MG/3ML) 0.083% IN NEBU: 2.5000 mg | INHALATION_SOLUTION | RESPIRATORY_TRACT | Status: DC | PRN

## 2024-06-23 MED ORDER — SODIUM CHLORIDE 0.9 % IV SOLN: INTRAVENOUS | Status: DC

## 2024-06-23 MED ORDER — ONDANSETRON 4 MG PO TBDP: 4.0000 mg | ORAL_TABLET | Freq: Three times a day (TID) | ORAL | Status: DC | PRN

## 2024-06-23 MED ORDER — ENOXAPARIN SODIUM 40 MG/0.4ML IJ SOSY: 40.0000 mg | PREFILLED_SYRINGE | Freq: Every day | INTRAMUSCULAR | Status: DC

## 2024-06-23 MED ORDER — IBUPROFEN 400 MG PO TABS: 400.0000 mg | ORAL_TABLET | Freq: Four times a day (QID) | ORAL | Status: DC | PRN

## 2024-06-23 NOTE — ED Provider Notes (Signed)
 [place] EMERGENCY DEPARTMENT AT [facility] Provider Note   CSN: [id number] Arrival date & time: 06/23/24  2043     Patient presents with: Hypotension   Jessica Walton is a 38 y.o. female presenting to ED with complaint of low blood pressure and dizziness.  Patient was seen about a week ago with the same symptoms.  She reports she has not been eating or drinking much since then.  She says she has had a sore throat, fever, chills, body aches.  Denies sick contacts around her.  Denies taking blood pressure medications.  EMS report positive orthostatic hypotension and route to the hospital and give the patient about 500 cc of fluid as well as 4 mg of Zofran .  The patient reports that her blood pressure has significantly decreased over the past 2 weeks.  She says she checks it regularly at home and her blood pressure is typically 170 and elevated, but for the past 2 weeks her blood pressure has been 90-100.  She says it is making her feel very weak and dizzy.  She has not taken any blood pressure medications include amlodipine  or HCTZ.  Patient has a history of polysubstance use, including opioid use.  She was admitted for metabolic encephalopathy several months ago related to polysubstance use.  {Add pertinent medical, surgical, social history, OB history to HPI:32947} HPI     Prior to Admission medications   Medication Sig Start Date End Date Taking? Authorizing Provider  amLODipine  (NORVASC ) 5 MG tablet Take 1 tablet (5 mg total) by mouth daily. Patient not taking: Reported on 10/28/2023 08/29/23 11/27/23  Joesph Shaver Scales, PA-C  hydrochlorothiazide  (HYDRODIURIL ) 12.5 MG tablet Take 1 tablet (12.5 mg total) by mouth in the morning. Patient not taking: Reported on 10/28/2023 08/29/23 11/27/23  Joesph Shaver Scales, PA-C  mirtazapine  (REMERON ) 30 MG tablet Take 1 tablet (30 mg total) by mouth at bedtime. 12/13/23   Nwoko, Uchenna E, PA  OLANZapine  (ZYPREXA ) 10 MG tablet TAKE 1  TABLET BY MOUTH EVERYDAY AT BEDTIME 12/13/23   Nwoko, Uchenna E, PA  ondansetron  (ZOFRAN -ODT) 4 MG disintegrating tablet Take 1 tablet (4 mg total) by mouth every 8 (eight) hours as needed for nausea or vomiting. 06/16/24   Ula Prentice SAUNDERS, MD    Allergies: Benadryl  [diphenhydramine  hcl] and Vicodin [hydrocodone -acetaminophen ]    Review of Systems  Updated Vital Signs BP 112/79   Pulse 77   Temp 98 F (36.7 C) (Oral)   Resp 18   Ht 5' 9 (1.753 m)   Wt 93 kg   LMP 06/23/2024 (Exact Date)   SpO2 100%   BMI 30.27 kg/m   Physical Exam Constitutional:      General: She is not in acute distress. HENT:     Head: Normocephalic and atraumatic.     Comments: Oropharynx non-erythematous.  No tonsillar swelling or exudate.  No uvular deviation.  No drooling. No brawny edema. No stridor. Voice is not muffled. Eyes:     Conjunctiva/sclera: Conjunctivae normal.     Pupils: Pupils are equal, round, and reactive to light.  Cardiovascular:     Rate and Rhythm: Normal rate and regular rhythm.  Pulmonary:     Effort: Pulmonary effort is normal. No respiratory distress.  Abdominal:     General: There is no distension.     Tenderness: There is no abdominal tenderness.  Skin:    General: Skin is warm and dry.  Neurological:     General: No focal deficit  present.     Mental Status: She is alert. Mental status is at baseline.  Psychiatric:        Mood and Affect: Mood normal.        Behavior: Behavior normal.     (all labs ordered are listed, but only abnormal results are displayed) Labs Reviewed  URINALYSIS, ROUTINE W REFLEX MICROSCOPIC - Abnormal; Notable for the following components:      Result Value   APPearance HAZY (*)    Specific Gravity, Urine >1.030 (*)    Bilirubin Urine SMALL (*)    Leukocytes,Ua TRACE (*)    All other components within normal limits  BASIC METABOLIC PANEL WITH GFR - Abnormal; Notable for the following components:   Glucose, Bld 101 (*)     Creatinine, Ser 1.29 (*)    Calcium  8.6 (*)    GFR, Estimated 55 (*)    All other components within normal limits  URINALYSIS, MICROSCOPIC (REFLEX) - Abnormal; Notable for the following components:   Bacteria, UA FEW (*)    All other components within normal limits  RESP PANEL BY RT-PCR (RSV, FLU A&B, COVID)  RVPGX2  GROUP A STREP BY PCR  CBC  PREGNANCY, URINE  RAPID URINE DRUG SCREEN, HOSP PERFORMED  ETHANOL  TROPONIN I (HIGH SENSITIVITY)    EKG: EKG Interpretation Date/Time:  Sunday June 23 2024 21:14:27 EST Ventricular Rate:  78 PR Interval:  149 QRS Duration:  92 QT Interval:  405 QTC Calculation: 462 R Axis:   46  Text Interpretation: Sinus rhythm Confirmed by Cottie Cough [phone]) on 06/23/2024 9:16:12 PM  Radiology: DG Chest Portable 1 View Result Date: 06/23/2024 CLINICAL DATA:  Near syncope EXAM: PORTABLE CHEST 1 VIEW COMPARISON:  Chest x-ray 10/28/2023 FINDINGS: There is mild elevation of the left hemidiaphragm. The heart size and mediastinal contours are within normal limits. Both lungs are clear. The visualized skeletal structures are unremarkable. IMPRESSION: No active disease. Electronically Signed   By: Greig Pique M.D.   On: 06/23/2024 21:44    {Document cardiac monitor, telemetry assessment procedure when appropriate:32947} Procedures   Medications Ordered in the ED  sodium chloride  0.9 % bolus 1,000 mL (has no administration in time range)      {Click here for ABCD2, HEART and other calculators REFRESH Note before signing:1}                              Medical Decision Making Amount and/or Complexity of Data Reviewed Labs: ordered. Radiology: ordered. ECG/medicine tests: ordered.  Risk Decision regarding hospitalization.   Differential diagnosis would include dehydration and orthostatic hypotension versus arrhythmia versus anemia versus viral illness including COVID-19 or influenza versus other  Supplemental history is provided by  EMS.  I personally reviewed and interpreted the patient's labs and imaging, notable for continued mild elevation of BUN and creatinine consistent with some dehydration.  Patient's urine is also concentrated with elevated specific gravity, small amount of bilirubin and hazy appearance.  No clear convincing evidence of infection with only trace leukocytes with many squamous cells.  Her flu and COVID testing is negative.  Her white blood cell count is normal.  Low suspicion for sepsis or bacterial infection.  Troponin is negative.  Doubt ACS  Very low suspicion for acute PE.  She is PERC negative, no hypoxia or tachycardia.  Doubt sepsis.  Doubt ACS  Patient was given IV fluids for hydration.  Some concern that the patient  has had a significant decrease in her baseline blood pressure very rapidly over 2 weeks without a clear etiology.  She continues to feel woozy and unsteady standing up, despite hydration in the ED.  We discussed at this point hospitalization for echocardiogram and observation overnight.  She is agreeable and would prefer to do this.     {Document critical care time when appropriate  Document review of labs and clinical decision tools ie CHADS2VASC2, etc  Document your independent review of radiology images and any outside records  Document your discussion with family members, caretakers and with consultants  Document social determinants of health affecting pt's care  Document your decision making why or why not admission, treatments were needed:32947:::1}   Final diagnoses:  Hypotension, unspecified hypotension type  Dehydration    ED Discharge Orders     None

## 2024-06-23 NOTE — ED Notes (Signed)
 Admitting MD at bedside. Delay in orthostatic VS.

## 2024-06-23 NOTE — Discharge Instructions (Addendum)
 Your blood test and urine sample showed that you are dehydrated.  It is very important that you drink plenty of water  at home to stay hydrated.  Juice, soda, and other beverages are sugary and do not hydrate your body.

## 2024-06-23 NOTE — ED Notes (Signed)
 Pt was able to ambulate to the restroom, and wheelchaired back due to c/o of dizziness when walking

## 2024-06-23 NOTE — ED Triage Notes (Signed)
 Pt BIB [facility] from home c/o of dizziness x1 week and nausea, flu like symptoms, orthostatic hypotension with EMS.   NS 4mg  zofran    107/61 110 HR 99% CBG 112

## 2024-06-24 DIAGNOSIS — J069 Acute upper respiratory infection, unspecified: Secondary | ICD-10-CM

## 2024-06-24 LAB — CBC
HCT: 35.8 % — ABNORMAL LOW (ref 36.0–46.0)
Hemoglobin: 12.2 g/dL (ref 12.0–15.0)
MCH: 28.4 pg (ref 26.0–34.0)
MCHC: 34.1 g/dL (ref 30.0–36.0)
MCV: 83.4 fL (ref 80.0–100.0)
Platelets: 390 K/uL (ref 150–400)
RBC: 4.29 MIL/uL (ref 3.87–5.11)
RDW: 14.8 % (ref 11.5–15.5)
WBC: 9.4 K/uL (ref 4.0–10.5)
nRBC: 0 % (ref 0.0–0.2)

## 2024-06-24 LAB — BASIC METABOLIC PANEL WITH GFR
Anion gap: 14 (ref 5–15)
BUN: 15 mg/dL (ref 6–20)
CO2: 18 mmol/L — ABNORMAL LOW (ref 22–32)
Calcium: 8.6 mg/dL — ABNORMAL LOW (ref 8.9–10.3)
Chloride: 104 mmol/L (ref 98–111)
Creatinine, Ser: 1.25 mg/dL — ABNORMAL HIGH (ref 0.44–1.00)
GFR, Estimated: 57 mL/min — ABNORMAL LOW (ref >60)
Glucose, Bld: 89 mg/dL (ref 70–99)
Potassium: 4.6 mmol/L (ref 3.5–5.1)
Sodium: 136 mmol/L (ref 135–145)

## 2024-06-24 LAB — HEMOGLOBIN A1C
Hgb A1c MFr Bld: 6.1 % — ABNORMAL HIGH (ref 4.8–5.6)
Mean Plasma Glucose: 128 mg/dL

## 2024-06-24 LAB — CREATININE, SERUM
Creatinine, Ser: 1.2 mg/dL — ABNORMAL HIGH (ref 0.44–1.00)
GFR, Estimated: 60 mL/min — ABNORMAL LOW (ref >60)

## 2024-06-24 LAB — ETHANOL: Alcohol, Ethyl (B): <15 mg/dL (ref <15)

## 2024-06-24 LAB — CORTISOL-AM, BLOOD: Cortisol - AM: 25.9 ug/dL — ABNORMAL HIGH (ref 6.7–22.6)

## 2024-06-24 MED ORDER — GUAIFENESIN-DM 100-10 MG/5ML PO SYRP: 5.0000 mL | ORAL_SOLUTION | ORAL | Status: DC | PRN

## 2024-06-24 MED ORDER — ACETAMINOPHEN 325 MG PO TABS: 650.0000 mg | ORAL_TABLET | Freq: Four times a day (QID) | ORAL | Status: DC | PRN

## 2024-06-24 MED ORDER — FLUTICASONE PROPIONATE 50 MCG/ACT NA SUSP
1.0000 | Freq: Every day | NASAL | Status: DC
  Filled 2024-06-24: qty 16

## 2024-06-24 MED ORDER — LORATADINE 10 MG PO TABS: 10.0000 mg | ORAL_TABLET | Freq: Every day | ORAL | Status: DC

## 2024-06-24 MED ORDER — DOXYCYCLINE HYCLATE 100 MG PO TABS
100.0000 mg | ORAL_TABLET | Freq: Two times a day (BID) | ORAL | Status: DC
  Administered 2024-06-24: 100 mg via ORAL
  Filled 2024-06-24: qty 1

## 2024-06-24 MED ORDER — HYDROXYZINE HCL 25 MG PO TABS
25.0000 mg | ORAL_TABLET | Freq: Once | ORAL | Status: AC
  Administered 2024-06-24: 25 mg via ORAL
  Filled 2024-06-24: qty 1

## 2024-06-24 NOTE — Progress Notes (Signed)
 Orthostatic BP readings at this time appear to be directly before admission to the unit:  BP lying 113/72 HR 75 BP sitting 115/80 HR 94 Standing 0 min. 121/74 HR 106 Standing 3 min 119/89 HR 120

## 2024-06-24 NOTE — H&P (Addendum)
 History and Physical    DOA: 06/23/2024  PCP: Rosalea Rosina SAILOR, PA  Patient coming from: home  Chief Complaint: dizziness  HPI: Jessica Walton is a 38 y.o. 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  Patient was seen in the ED last week for headache, dizziness and upper respiratory symptoms.  She had negative workup, received IV Toradol  for headache, IV fluids for orthostatic hypotension and discharged home from the ED.  Patient reports ongoing upper respiratory symptoms with nasal congestion, sore throat, cough and headaches over the last week.  She lives with her boyfriend who was also apparently sick last week.  Patient reports 7 pound weight loss over the last week.  She states she has been eating only 50% of her meals due to loss of appetite but has been drinking a lot of fluids.  She reports being diagnosed with hypertension many years back but stopped taking blood pressure medications almost an year back for low normal blood pressures.  She denies any cocaine use over the last few years but does report noting elevated blood pressure and systolic 170s to 819d about a month back-she states her blood pressure is always elevated due to her mood changes with PTSD/bipolar disease. She reports being diagnosed with seizure disorder in the past but stopped taking Topamax  due to intolerance.  She denies any recent seizure episode.  No report of any recent bleeding episodes-no history of menorrhagia or epistaxis, hematuria or melena or hematochezia. ED course: Afebrile, pulse 70s, BP 103/74-123/95.  CBC within normal limits.  Sodium 126, potassium 4.2, bicarb 24, BUN 12, creatinine 1.29 (baseline~1.3) troponin 6 BG 101 calcium  8.6 (albumin 3.4 on  labs last week).  Flu and COVID panel negative.  Chest x-ray and UA unremarkable for signs of infection (WBC 0-5).  Patient received IV fluids in the ED and was planned for discharge home but patient had recurrence of dizziness when walked to the bathroom and had to be brought back in a wheelchair to her room in the ED.  Orthostatic BP check pending in the ED.  Patient requested to be admitted for further IV hydration for possible dehydration.  Review of Systems: As per HPI, otherwise review of systems negative.    Past Medical History:  Diagnosis Date   Asthma    No inhaler use x 1 yr (2013)   Bipolar 1 disorder (HCC)    Cocaine abuse (HCC) 2018   Headache    History of suicide attempt 2015   Hypertension    Lordosis    Opiate abuse (HCC) 2018   Opioid use disorder, severe, dependence (HCC) 07/25/2016   PTSD (post-traumatic stress disorder)    Seizures (HCC)    Tetrahydrocannabinol (THC) dependence (HCC)     Past Surgical History:  Procedure Laterality Date   EYE SURGERY     WISDOM TOOTH EXTRACTION     x4    Social history:  reports that she has quit smoking. Her smoking use included cigarettes. She has a 5 pack-year smoking history. She has never used smokeless tobacco. She reports that she does not currently use alcohol . She reports that she does not use drugs.   Allergies  Allergen Reactions   Benadryl  [Diphenhydramine  Hcl] Other (See Comments)    Causes seizure  activity   Vicodin [Hydrocodone -Acetaminophen ] Rash and Other (See Comments)    And seizures. Can tolerate Tylenol     Family History  Problem Relation Age of Onset   Hypertension Maternal Grandmother    Diabetes Maternal Grandmother    Seizures Neg Hx       Prior to Admission medications   Medication Sig Start Date End Date Taking? Authorizing Provider  amLODipine  (NORVASC ) 5 MG tablet Take 1 tablet (5 mg total) by mouth daily. Patient not taking: Reported on 10/28/2023 08/29/23 11/27/23  Joesph Shaver  Scales, PA-C  hydrochlorothiazide  (HYDRODIURIL ) 12.5 MG tablet Take 1 tablet (12.5 mg total) by mouth in the morning. Patient not taking: Reported on 10/28/2023 08/29/23 11/27/23  Joesph Shaver Scales, PA-C  mirtazapine  (REMERON ) 30 MG tablet Take 1 tablet (30 mg total) by mouth at bedtime. 12/13/23   Nwoko, Uchenna E, PA  OLANZapine  (ZYPREXA ) 10 MG tablet TAKE 1 TABLET BY MOUTH EVERYDAY AT BEDTIME 12/13/23   Nwoko, Uchenna E, PA  ondansetron  (ZOFRAN -ODT) 4 MG disintegrating tablet Take 1 tablet (4 mg total) by mouth every 8 (eight) hours as needed for nausea or vomiting. 06/16/24   Ula Prentice SAUNDERS, MD    Physical Exam: Vitals:   06/23/24 2050 06/23/24 2052 06/23/24 2115 06/23/24 2215  BP: 103/74  105/71 112/79  Pulse: 73  72 77  Resp: 18  17 18   Temp: 98 F (36.7 C)     TempSrc: Oral     SpO2: 100%  100% 100%  Weight:  93 kg    Height:  5' 9 (1.753 m)      Constitutional: NAD, calm, comfortable, has some upper respiratory congestion and request today tissue for blowing her nose Eyes: PERRL, lids and conjunctivae normal ENMT: Mucous membranes are moist. Posterior pharynx clear of any exudate or lesions.Normal dentition.  Neck: normal, supple, no masses, no thyromegaly Respiratory: clear to auscultation bilaterally, no wheezing, no crackles. Normal respiratory effort. No accessory muscle use.  Cardiovascular: Regular rate and rhythm, no murmurs / rubs / gallops. No extremity edema. 2+ pedal pulses. No carotid bruits.  Abdomen: no tenderness, no masses palpated. No hepatosplenomegaly. Bowel sounds positive.  Musculoskeletal: no clubbing / cyanosis. No joint deformity upper and lower extremities. Good ROM, no contractures. Normal muscle tone.  Neurologic: CN 2-12 grossly intact. Sensation intact, DTR normal. Strength 5/5 in all 4.  Psychiatric: Normal judgment and insight. Alert and oriented x 3. Normal mood.  SKIN/catheters: no rashes, lesions, ulcers. No induration  Labs on Admission: I  have personally reviewed following labs and imaging studies  CBC: Recent Labs  Lab 06/23/24 2055  WBC 7.3  HGB 12.8  HCT 38.8  MCV 85.1  PLT 361   Basic Metabolic Panel: Recent Labs  Lab 06/23/24 2148  NA 136  K 4.2  CL 101  CO2 24  GLUCOSE 101*  BUN 12  CREATININE 1.29*  CALCIUM  8.6*   GFR: Estimated Creatinine Clearance: 72.5 mL/min (A) (by C-G formula based on SCr of 1.29 mg/dL (H)). Recent Labs  Lab 06/23/24 2055  WBC 7.3   Liver Function Tests: No results for input(s): AST, ALT, ALKPHOS, BILITOT, PROT, ALBUMIN in the last 168 hours. No results for input(s): LIPASE, AMYLASE in the last 168 hours. No results for input(s): AMMONIA in the last 168 hours. Coagulation Profile: No results for input(s): INR, PROTIME in the last 168 hours. Cardiac Enzymes: No results for input(s): CKTOTAL, CKMB, CKMBINDEX, TROPONINI in the last 168 hours. BNP (last 3 results) No  results for input(s): PROBNP in the last 8760 hours. HbA1C: No results for input(s): HGBA1C in the last 72 hours. CBG: No results for input(s): GLUCAP in the last 168 hours. Lipid Profile: No results for input(s): CHOL, HDL, LDLCALC, TRIG, CHOLHDL, LDLDIRECT in the last 72 hours. Thyroid  Function Tests: No results for input(s): TSH, T4TOTAL, FREET4, T3FREE, THYROIDAB in the last 72 hours. Anemia Panel: No results for input(s): VITAMINB12, FOLATE, FERRITIN, TIBC, IRON, RETICCTPCT in the last 72 hours. Urine analysis:    Component Value Date/Time   COLORURINE YELLOW 06/23/2024 2231   APPEARANCEUR HAZY (A) 06/23/2024 2231   LABSPEC >1.030 (H) 06/23/2024 2231   PHURINE 6.0 06/23/2024 2231   GLUCOSEU NEGATIVE 06/23/2024 2231   HGBUR NEGATIVE 06/23/2024 2231   BILIRUBINUR SMALL (A) 06/23/2024 2231   KETONESUR NEGATIVE 06/23/2024 2231   PROTEINUR NEGATIVE 06/23/2024 2231   UROBILINOGEN 1.0 09/27/2022 0906   NITRITE NEGATIVE 06/23/2024  2231   LEUKOCYTESUR TRACE (A) 06/23/2024 2231    Radiological Exams on Admission: Personally reviewed  DG Chest Portable 1 View Result Date: 06/23/2024 CLINICAL DATA:  Near syncope EXAM: PORTABLE CHEST 1 VIEW COMPARISON:  Chest x-ray 10/28/2023 FINDINGS: There is mild elevation of the left hemidiaphragm. The heart size and mediastinal contours are within normal limits. Both lungs are clear. The visualized skeletal structures are unremarkable. IMPRESSION: No active disease. Electronically Signed   By: Greig Pique M.D.   On: 06/23/2024 21:44    EKG: Independently reviewed.  Normal sinus rhythm, QTc 452 ms     Assessment and Plan:   Principal Problem:   Orthostatic hypotension    1.Dizziness, orthostatic hypotension: Likely secondary to dehydration in the setting of recent URI, poor oral intake.  Patient reports weight loss with decreased appetite but denies any new medication changes.  No signs of infection other than URI.  Afebrile without leukocytosis.  Viral panel negative.  Given report of polydipsia and polyuria, will check hemoglobin A1c although blood glucose level unremarkable.  Patient advised to discontinue prior antihypertensives (she has not been taking these for a while now in any case).  Will check a.m. cortisol level as well.  No leg edema to suggest venous insufficiency.  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  2.  Upper respiratory infection, headaches: COVID/flu panel negative.  Will treat with nasal decongestants and empiric antibiotics for possible sinusitis.  Patient does have a history of chronic headaches in the setting of underlying mood disorder but denies any formal diagnosis of migraines.  Was treated with IV Toradol  in the last ED visit.  Patient did have an unremarkable CT head in April this year when she was admitted to ICU for drug overdose.  No focal  neurological signs currently-Will defer head imaging.   3.  Seizure disorder: Patient apparently was followed by a neurologist in the past but stopped taking Topamax  a year back and has not followed up since.  Patient appears to be a poor historian overall with conflicting timelines for symptoms and prescriptions.  4.  CKD stage IIIa: Secondary to uncontrolled blood pressure versus cocaine use.  Patient's creatinine appears to be at baseline currently. Avoid nephrotoxic agents.   5.  PTSD, bipolar disorder: Patient admitted in April of this year with drug overdose related encephalopathy needing intubation.  Denies any suicidal or homicidal ideations currently.  Not on any psych meds currently.  Follow-up primary provider as outpatient.  6.  Polysubstance use: Patient states she quit cocaine use around 2021.  It is unclear if she was diagnosed with hypertension in the setting of ongoing cocaine use which would explain normalization of blood pressure now.  He respectively   DVT prophylaxis: Lovenox   COVID screen: Negative  Code Status: Full code.Health care proxy would be her boyfriend Rex  Patient/Family Communication: Discussed with patient and all questions answered to satisfaction.  Consults called: None Admission status :Patient will be admitted under OBSERVATION status.The patient's presenting symptoms, physical exam findings, and initial radiographic and laboratory data in the context of their medical condition is felt to place them at low risk for further clinical deterioration. Furthermore, it is anticipated that the patient will be medically stable for discharge from the hospital within 2 midnights of hospital stay.       Sven Brocks MD [facility] Pager in [place]  If 7PM-7AM, please contact night-coverage [email or web address]   06/24/2024, 12:03 AM

## 2024-06-24 NOTE — Discharge Summary (Signed)
 Physician Discharge Summary  Jessica Walton FMW:[id number] DOB: [birth year] DOA: 06/23/2024  PCP: Rosalea Rosina SAILOR, PA  Admit date: 06/23/2024  Discharge date: 06/24/2024  Admitted From: Home Disposition: Left AMA  Recommendations for Outpatient Follow-up:  Left AMA  Home Health:None Equipment/Devices:None  Discharge Condition: Fair CODE STATUS:Full code Diet recommendation: Heart Healthy  Brief Summary / Hospital Course: This 38 yrs old female with PMH significant for polysubstance abuse, PTSD, bipolar disorder, seizure disorder, chronic headaches, asthma and hypertension presents with complaints of ongoing headaches and dizziness for the last week. 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 She had negative workup, received IV Toradol  for headache, IV fluids for orthostatic hypotension and discharged home from the ED. Patient reports ongoing upper respiratory symptoms with nasal congestion, sore throat, cough and headaches over the last week. She lives with her boyfriend who was also apparently sick last week. Patient reports 7 pound weight loss over the last week. She states she has been eating only 50% of her meals due to loss of appetite but has been drinking a lot of fluids. She reports being diagnosed with hypertension many years back but stopped taking blood pressure medications almost an year back for low normal blood pressures. She reports being diagnosed with seizure disorder in the past but stopped taking Topamax  due to intolerance. She denies any recent seizure episode. No report of any recent bleeding episodes-no history of menorrhagia or epistaxis, hematuria or melena or hematochezia.  Patient was admitted for orthostatic hypotension.  She left AGAINST MEDICAL ADVICE without being seen by attending.  Discharge Diagnoses:  Principal  Problem:   Orthostatic hypotension Active Problems:   Upper respiratory infection    Discharge Instructions Left  AMA  Allergies as of 06/24/2024       Reactions   Benadryl  [diphenhydramine  Hcl] Other (See Comments)   Causes seizure activity   Vicodin [hydrocodone -acetaminophen ] Rash, Other (See Comments)   And seizures. Can tolerate Tylenol         Medication List     ASK your doctor about these medications    amLODipine  5 MG tablet Commonly known as: NORVASC  Take 1 tablet (5 mg total) by mouth daily.   hydrochlorothiazide  12.5 MG tablet Wait to take this until your doctor or other care provider tells you to start again. Commonly known as: HYDRODIURIL  Take 1 tablet (12.5 mg total) by mouth in the morning.   mirtazapine  30 MG tablet Commonly known as: REMERON  Take 1 tablet (30 mg total) by mouth at bedtime.   OLANZapine  10 MG tablet Commonly known as: ZYPREXA  TAKE 1 TABLET BY MOUTH EVERYDAY AT BEDTIME   ondansetron  4 MG disintegrating tablet Commonly known as: ZOFRAN -ODT Take 1 tablet (4 mg total) by mouth every 8 (eight) hours as needed for nausea or vomiting.        Follow-up Information     Go to  [facility] Emergency Department at [facility].   Specialty: Emergency Medicine Why: If symptoms worsen Contact information: [address] [place]  [address] [phone]        Rosalea Rosina SAILOR, [place]. Schedule an appointment as soon as possible for a visit in 3 days.   Specialty: Physician Field Seismologist information: [address] [place] [address] [phone]  Allergies  Allergen Reactions   Benadryl  [Diphenhydramine  Hcl] Other (See Comments)    Causes seizure activity   Vicodin [Hydrocodone -Acetaminophen ] Rash and Other (See Comments)    And seizures. Can tolerate Tylenol     Consultations: None   Procedures/Studies: DG Chest Portable 1 View Result Date:  06/23/2024 CLINICAL DATA:  Near syncope EXAM: PORTABLE CHEST 1 VIEW COMPARISON:  Chest x-ray 10/28/2023 FINDINGS: There is mild elevation of the left hemidiaphragm. The heart size and mediastinal contours are within normal limits. Both lungs are clear. The visualized skeletal structures are unremarkable. IMPRESSION: No active disease. Electronically Signed   By: Greig Pique M.D.   On: 06/23/2024 21:44    Subjective: Left AGAINST MEDICAL ADVICE before being seen by MD.  Discharge Exam: Vitals:   06/24/24 0314 06/24/24 0716  BP: 128/89 (!) 163/97  Pulse: 74 (!) 105  Resp: 16 18  Temp: 99.2 F (37.3 C) 98.4 F (36.9 C)  SpO2: 100% 100%   Vitals:   06/24/24 0004 06/24/24 0030 06/24/24 0314 06/24/24 0716  BP: 115/80 (!) 128/94 128/89 (!) 163/97  Pulse: 93 78 74 (!) 105  Resp: 19 20 16 18   Temp:  98.4 F (36.9 C) 99.2 F (37.3 C) 98.4 F (36.9 C)  TempSrc:  Oral Oral Oral  SpO2: 100% 100% 100% 100%  Weight:      Height:           The results of significant diagnostics from this hospitalization (including imaging, microbiology, ancillary and laboratory) are listed below for reference.     Microbiology: Recent Results (from the past 240 hours)  Resp panel by RT-PCR (RSV, Flu A&B, Covid) Anterior Nasal Swab     Status: None   Collection Time: 06/23/24  8:55 PM   Specimen: Anterior Nasal Swab  Result Value Ref Range Status   SARS Coronavirus 2 by RT PCR NEGATIVE NEGATIVE Final   Influenza A by PCR NEGATIVE NEGATIVE Final   Influenza B by PCR NEGATIVE NEGATIVE Final    Comment: (NOTE) The Xpert Xpress SARS-CoV-2/FLU/RSV plus assay is intended as an aid in the diagnosis of influenza from Nasopharyngeal swab specimens and should not be used as a sole basis for treatment. Nasal washings and aspirates are unacceptable for Xpert Xpress SARS-CoV-2/FLU/RSV testing.  Fact Sheet for Patients: bloggercourse.com  Fact Sheet for Healthcare  Providers: seriousbroker.it  This test is not yet approved or cleared by the [place]  FDA and has been authorized for detection and/or diagnosis of SARS-CoV-2 by FDA under an Emergency Use Authorization (EUA). This EUA will remain in effect (meaning this test can be used) for the duration of the COVID-19 declaration under Section 564(b)(1) of the Act, 21 U.S.C. section 360bbb-3(b)(1), unless the authorization is terminated or revoked.     Resp Syncytial Virus by PCR NEGATIVE NEGATIVE Final    Comment: (NOTE) Fact Sheet for Patients: bloggercourse.com  Fact Sheet for Healthcare Providers: seriousbroker.it  This test is not yet approved or cleared by the [place]  FDA and has been authorized for detection and/or diagnosis of SARS-CoV-2 by FDA under an Emergency Use Authorization (EUA). This EUA will remain in effect (meaning this test can be used) for the duration of the COVID-19 declaration under Section 564(b)(1) of the Act, 21 U.S.C. section 360bbb-3(b)(1), unless the authorization is terminated or revoked.  Performed at [facility], [address] [place], [place] [address]   Group A Strep by PCR     Status: None   Collection Time: 06/23/24  8:55 PM   Specimen: Anterior Nasal Swab; Sterile Swab  Result Value Ref Range Status   Group A Strep by PCR NOT DETECTED NOT DETECTED Final    Comment: Performed at [facility], [address], [place], [place] [address]     Labs: BNP (last 3 results) No results for input(s): BNP in the last 8760 hours. Basic Metabolic Panel: Recent Labs  Lab 06/23/24 2148 06/24/24 0428  NA 136 136  K 4.2 4.6  CL 101 104  CO2 24 18*  GLUCOSE 101* 89  BUN 12 15  CREATININE 1.29* 1.25*  1.20*  CALCIUM  8.6* 8.6*   Liver Function Tests: No results for input(s): AST, ALT, ALKPHOS, BILITOT, PROT, ALBUMIN in the last 168 hours. No  results for input(s): LIPASE, AMYLASE in the last 168 hours. No results for input(s): AMMONIA in the last 168 hours. CBC: Recent Labs  Lab 06/23/24 2055 06/24/24 0428  WBC 7.3 9.4  HGB 12.8 12.2  HCT 38.8 35.8*  MCV 85.1 83.4  PLT 361 390   Cardiac Enzymes: No results for input(s): CKTOTAL, CKMB, CKMBINDEX, TROPONINI in the last 168 hours. BNP: Invalid input(s): POCBNP CBG: No results for input(s): GLUCAP in the last 168 hours. D-Dimer No results for input(s): DDIMER in the last 72 hours. Hgb A1c No results for input(s): HGBA1C in the last 72 hours. Lipid Profile No results for input(s): CHOL, HDL, LDLCALC, TRIG, CHOLHDL, LDLDIRECT in the last 72 hours. Thyroid  function studies No results for input(s): TSH, T4TOTAL, T3FREE, THYROIDAB in the last 72 hours.  Invalid input(s): FREET3 Anemia work up No results for input(s): VITAMINB12, FOLATE, FERRITIN, TIBC, IRON, RETICCTPCT in the last 72 hours. Urinalysis    Component Value Date/Time   COLORURINE YELLOW 06/23/2024 2231   APPEARANCEUR HAZY (A) 06/23/2024 2231   LABSPEC >1.030 (H) 06/23/2024 2231   PHURINE 6.0 06/23/2024 2231   GLUCOSEU NEGATIVE 06/23/2024 2231   HGBUR NEGATIVE 06/23/2024 2231   BILIRUBINUR SMALL (A) 06/23/2024 2231   KETONESUR NEGATIVE 06/23/2024 2231   PROTEINUR NEGATIVE 06/23/2024 2231   UROBILINOGEN 1.0 09/27/2022 0906   NITRITE NEGATIVE 06/23/2024 2231   LEUKOCYTESUR TRACE (A) 06/23/2024 2231   Sepsis Labs Recent Labs  Lab 06/23/24 2055 06/24/24 0428  WBC 7.3 9.4   Microbiology Recent Results (from the past 240 hours)  Resp panel by RT-PCR (RSV, Flu A&B, Covid) Anterior Nasal Swab     Status: None   Collection Time: 06/23/24  8:55 PM   Specimen: Anterior Nasal Swab  Result Value Ref Range Status   SARS Coronavirus 2 by RT PCR NEGATIVE NEGATIVE Final   Influenza A by PCR NEGATIVE NEGATIVE Final   Influenza B by PCR NEGATIVE NEGATIVE  Final    Comment: (NOTE) The Xpert Xpress SARS-CoV-2/FLU/RSV plus assay is intended as an aid in the diagnosis of influenza from Nasopharyngeal swab specimens and should not be used as a sole basis for treatment. Nasal washings and aspirates are unacceptable for Xpert Xpress SARS-CoV-2/FLU/RSV testing.  Fact Sheet for Patients: bloggercourse.com  Fact Sheet for Healthcare Providers: seriousbroker.it  This test is not yet approved or cleared by the [place]  FDA and has been authorized for detection and/or diagnosis of SARS-CoV-2 by FDA under an Emergency Use Authorization (EUA). This EUA will remain in effect (meaning this test can be used) for the duration of the COVID-19 declaration under Section 564(b)(1) of the Act, 21 U.S.C. section 360bbb-3(b)(1), unless the authorization is terminated or revoked.     Resp  Syncytial Virus by PCR NEGATIVE NEGATIVE Final    Comment: (NOTE) Fact Sheet for Patients: bloggercourse.com  Fact Sheet for Healthcare Providers: seriousbroker.it  This test is not yet approved or cleared by the [place]  FDA and has been authorized for detection and/or diagnosis of SARS-CoV-2 by FDA under an Emergency Use Authorization (EUA). This EUA will remain in effect (meaning this test can be used) for the duration of the COVID-19 declaration under Section 564(b)(1) of the Act, 21 U.S.C. section 360bbb-3(b)(1), unless the authorization is terminated or revoked.  Performed at [facility], [address], [place], [place] [address]   Group A Strep by PCR     Status: None   Collection Time: 06/23/24  8:55 PM   Specimen: Anterior Nasal Swab; Sterile Swab  Result Value Ref Range Status   Group A Strep by PCR NOT DETECTED NOT DETECTED Final    Comment: Performed at [facility], [address], [place], [place] [address]     Time  coordinating discharge: LEFT AMA  SIGNED:   Darcel Dawley, MD  [facility] 06/24/2024, 4:06 PM Pager   If 7PM-7AM, please contact night-coverage

## 2024-06-24 NOTE — ED Notes (Signed)
 Report called to [facility] RN for rm [phone]

## 2024-06-24 NOTE — Plan of Care (Signed)

## 2024-06-24 NOTE — H&P (Incomplete)
 History and Physical    DOA: 06/23/2024  PCP: Rosalea Rosina SAILOR, PA  Patient coming from: ***  Chief Complaint: ***  HPI: Jessica Walton is a 38 y.o. female with history h/o    Review of Systems: As per HPI, otherwise review of systems negative.    Past Medical History:  Diagnosis Date  . Asthma    No inhaler use x 1 yr (2013)  . Bipolar 1 disorder (HCC)   . Cocaine abuse (HCC) 2018  . Headache   . History of suicide attempt 2015  . Hypertension   . Lordosis   . Opiate abuse (HCC) 2018  . Opioid use disorder, severe, dependence (HCC) 07/25/2016  . PTSD (post-traumatic stress disorder)   . Seizures (HCC)   . Tetrahydrocannabinol (THC) dependence [facility])     Past Surgical History:  Procedure Laterality Date  . EYE SURGERY    . WISDOM TOOTH EXTRACTION     x4    Social history:  reports that she has quit smoking. Her smoking use included cigarettes. She has a 5 pack-year smoking history. She has never used smokeless tobacco. She reports that she does not currently use alcohol . She reports that she does not use drugs.   Allergies  Allergen Reactions  . Benadryl  [Diphenhydramine  Hcl] Other (See Comments)    Causes seizure activity  . Vicodin [Hydrocodone -Acetaminophen ] Rash and Other (See Comments)    And seizures. Can tolerate Tylenol     Family History  Problem Relation Age of Onset  . Hypertension Maternal Grandmother   . Diabetes Maternal Grandmother   . Seizures Neg Hx       Prior to Admission medications   Medication Sig Start Date End Date Taking? Authorizing Provider  amLODipine  (NORVASC ) 5 MG tablet Take 1 tablet (5 mg total) by mouth daily. Patient not taking: Reported on 10/28/2023 08/29/23 11/27/23  Joesph Shaver Scales, PA-C  hydrochlorothiazide  (HYDRODIURIL ) 12.5 MG tablet Take 1 tablet (12.5 mg total) by mouth in the morning. Patient not taking: Reported on 10/28/2023 08/29/23 11/27/23  Joesph Shaver Scales, PA-C  mirtazapine  (REMERON ) 30 MG tablet  Take 1 tablet (30 mg total) by mouth at bedtime. 12/13/23   Nwoko, Uchenna E, PA  OLANZapine  (ZYPREXA ) 10 MG tablet TAKE 1 TABLET BY MOUTH EVERYDAY AT BEDTIME 12/13/23   Nwoko, Uchenna E, PA  ondansetron  (ZOFRAN -ODT) 4 MG disintegrating tablet Take 1 tablet (4 mg total) by mouth every 8 (eight) hours as needed for nausea or vomiting. 06/16/24   Ula Prentice SAUNDERS, MD    Physical Exam:*** Vitals:   06/23/24 2050 06/23/24 2052 06/23/24 2115 06/23/24 2215  BP: 103/74  105/71 112/79  Pulse: 73  72 77  Resp: 18  17 18   Temp: 98 F (36.7 C)     TempSrc: Oral     SpO2: 100%  100% 100%  Weight:  93 kg    Height:  5' 9 (1.753 m)      Constitutional: NAD, calm, comfortable Eyes: PERRL, lids and conjunctivae normal ENMT: Mucous membranes are moist. Posterior pharynx clear of any exudate or lesions.Normal dentition.  Neck: normal, supple, no masses, no thyromegaly Respiratory: clear to auscultation bilaterally, no wheezing, no crackles. Normal respiratory effort. No accessory muscle use.  Cardiovascular: Regular rate and rhythm, no murmurs / rubs / gallops. No extremity edema. 2+ pedal pulses. No carotid bruits.  Abdomen: no tenderness, no masses palpated. No hepatosplenomegaly. Bowel sounds positive.  Musculoskeletal: no clubbing / cyanosis. No joint deformity upper and lower extremities.  Good ROM, no contractures. Normal muscle tone.  Neurologic: CN 2-12 grossly intact. Sensation intact, DTR normal. Strength 5/5 in all 4.  Psychiatric: Normal judgment and insight. Alert and oriented x 3. Normal mood.  SKIN/catheters: no rashes, lesions, ulcers. No induration  Labs on Admission: I have personally reviewed following labs and imaging studies  CBC: Recent Labs  Lab 06/23/24 2055  WBC 7.3  HGB 12.8  HCT 38.8  MCV 85.1  PLT 361   Basic Metabolic Panel: Recent Labs  Lab 06/23/24 2148  NA 136  K 4.2  CL 101  CO2 24  GLUCOSE 101*  BUN 12  CREATININE 1.29*  CALCIUM  8.6*    GFR: Estimated Creatinine Clearance: 72.5 mL/min (A) (by C-G formula based on SCr of 1.29 mg/dL (H)). Recent Labs  Lab 06/23/24 2055  WBC 7.3   Liver Function Tests: No results for input(s): AST, ALT, ALKPHOS, BILITOT, PROT, ALBUMIN in the last 168 hours. No results for input(s): LIPASE, AMYLASE in the last 168 hours. No results for input(s): AMMONIA in the last 168 hours. Coagulation Profile: No results for input(s): INR, PROTIME in the last 168 hours. Cardiac Enzymes: No results for input(s): CKTOTAL, CKMB, CKMBINDEX, TROPONINI in the last 168 hours. BNP (last 3 results) No results for input(s): PROBNP in the last 8760 hours. HbA1C: No results for input(s): HGBA1C in the last 72 hours. CBG: No results for input(s): GLUCAP in the last 168 hours. Lipid Profile: No results for input(s): CHOL, HDL, LDLCALC, TRIG, CHOLHDL, LDLDIRECT in the last 72 hours. Thyroid  Function Tests: No results for input(s): TSH, T4TOTAL, FREET4, T3FREE, THYROIDAB in the last 72 hours. Anemia Panel: No results for input(s): VITAMINB12, FOLATE, FERRITIN, TIBC, IRON, RETICCTPCT in the last 72 hours. Urine analysis:    Component Value Date/Time   COLORURINE YELLOW 06/23/2024 2231   APPEARANCEUR HAZY (A) 06/23/2024 2231   LABSPEC >1.030 (H) 06/23/2024 2231   PHURINE 6.0 06/23/2024 2231   GLUCOSEU NEGATIVE 06/23/2024 2231   HGBUR NEGATIVE 06/23/2024 2231   BILIRUBINUR SMALL (A) 06/23/2024 2231   KETONESUR NEGATIVE 06/23/2024 2231   PROTEINUR NEGATIVE 06/23/2024 2231   UROBILINOGEN 1.0 09/27/2022 0906   NITRITE NEGATIVE 06/23/2024 2231   LEUKOCYTESUR TRACE (A) 06/23/2024 2231    Radiological Exams on Admission: Personally reviewed  DG Chest Portable 1 View Result Date: 06/23/2024 CLINICAL DATA:  Near syncope EXAM: PORTABLE CHEST 1 VIEW COMPARISON:  Chest x-ray 10/28/2023 FINDINGS: There is mild elevation of the left  hemidiaphragm. The heart size and mediastinal contours are within normal limits. Both lungs are clear. The visualized skeletal structures are unremarkable. IMPRESSION: No active disease. Electronically Signed   By: Greig Pique M.D.   On: 06/23/2024 21:44    EKG: Independently reviewed. ***     Assessment and Plan:   Principal Problem:   Orthostatic hypotension    1.  2.  3.   4.   DVT prophylaxis:   COVID screen:  Code Status:    .Health care proxy would be   Patient/Family Communication: Discussed with patient and all questions answered to satisfaction.  Consults called:  Admission status :     Sven Brocks MD [facility] Pager in [place]  If 7PM-7AM, please contact night-coverage [email or web address]   06/24/2024, 12:03 AM

## 2024-06-24 NOTE — Progress Notes (Signed)
 PT left AMA. Didn't want to wait on the MD. Patient states she had crocs on admission but now they are missing. I helped look for crocs no where to be found. Socks were provided before AMA.
